# Patient Record
Sex: Male | Born: 1965 | Race: White | Hispanic: No | Smoking: Never smoker
Health system: Southern US, Community
[De-identification: ages and names within clinical notes are randomized; demographics above are authoritative.]

## PROBLEM LIST (undated history)

## (undated) DIAGNOSIS — R161 Splenomegaly, not elsewhere classified: Secondary | ICD-10-CM

## (undated) DIAGNOSIS — C61 Malignant neoplasm of prostate: Secondary | ICD-10-CM

## (undated) DIAGNOSIS — N2889 Other specified disorders of kidney and ureter: Secondary | ICD-10-CM

## (undated) DIAGNOSIS — I10 Essential (primary) hypertension: Secondary | ICD-10-CM

## (undated) DIAGNOSIS — R55 Syncope and collapse: Secondary | ICD-10-CM

## (undated) DIAGNOSIS — E781 Pure hyperglyceridemia: Secondary | ICD-10-CM

## (undated) DIAGNOSIS — E8881 Metabolic syndrome: Secondary | ICD-10-CM

## (undated) DIAGNOSIS — Z836 Family history of other diseases of the respiratory system: Secondary | ICD-10-CM

## (undated) DIAGNOSIS — C9211 Chronic myeloid leukemia, BCR/ABL-positive, in remission: Secondary | ICD-10-CM

## (undated) DIAGNOSIS — Z8701 Personal history of pneumonia (recurrent): Secondary | ICD-10-CM

## (undated) DIAGNOSIS — K7689 Other specified diseases of liver: Secondary | ICD-10-CM

## (undated) DIAGNOSIS — N183 Chronic kidney disease, stage 3 (moderate): Secondary | ICD-10-CM

## (undated) DIAGNOSIS — K603 Anal fistula: Secondary | ICD-10-CM

## (undated) DIAGNOSIS — N182 Chronic kidney disease, stage 2 (mild): Secondary | ICD-10-CM

## (undated) DIAGNOSIS — L290 Pruritus ani: Secondary | ICD-10-CM

## (undated) HISTORY — DX: Other specified disorders of kidney and ureter: N28.89

## (undated) HISTORY — DX: Splenomegaly, not elsewhere classified: R16.1

## (undated) HISTORY — DX: Essential (primary) hypertension: I10

## (undated) HISTORY — DX: Chronic kidney disease, stage 2 (mild): N18.2

## (undated) HISTORY — DX: Malignant neoplasm of prostate: C61

## (undated) HISTORY — PX: TONSILECTOMY, ADENOIDECTOMY, BILATERAL MYRINGOTOMY AND TUBES: SHX2538

## (undated) HISTORY — DX: Other specified diseases of liver: K76.89

## (undated) HISTORY — DX: Personal history of pneumonia (recurrent): Z87.01

## (undated) HISTORY — DX: Chronic myeloid leukemia, BCR/ABL-positive, in remission: C92.11

## (undated) HISTORY — DX: Metabolic syndrome: E88.810

## (undated) HISTORY — PX: PROSTATE BIOPSY: SHX241

## (undated) HISTORY — DX: Syncope and collapse: R55

## (undated) HISTORY — PX: EYE SURGERY: SHX253

## (undated) HISTORY — DX: Pure hyperglyceridemia: E78.1

## (undated) HISTORY — DX: Anal fistula: K60.3

## (undated) HISTORY — DX: Family history of other diseases of the respiratory system: Z83.6

## (undated) HISTORY — DX: Pruritus ani: L29.0

## (undated) HISTORY — DX: Metabolic syndrome: E88.81

## (undated) HISTORY — DX: Chronic kidney disease, stage 3 (moderate): N18.3

---

## 1998-12-20 HISTORY — PX: REFRACTIVE SURGERY: SHX103

## 2008-02-12 ENCOUNTER — Ambulatory Visit: Payer: Self-pay | Admitting: Internal Medicine

## 2008-02-12 ENCOUNTER — Ambulatory Visit: Payer: Self-pay | Admitting: Cardiology

## 2008-02-12 DIAGNOSIS — D649 Anemia, unspecified: Secondary | ICD-10-CM

## 2008-02-12 DIAGNOSIS — R161 Splenomegaly, not elsewhere classified: Secondary | ICD-10-CM

## 2008-02-12 LAB — CONVERTED CEMR LAB
ALT: 35 units/L (ref 0–53)
AST: 39 units/L — ABNORMAL HIGH (ref 0–37)
Albumin: 4.3 g/dL (ref 3.5–5.2)
Alkaline Phosphatase: 102 units/L (ref 39–117)
BUN: 15 mg/dL (ref 6–23)
Band Neutrophils: 26 % — ABNORMAL HIGH (ref 0–10)
Basophils Relative: 5 % — ABNORMAL HIGH (ref 0–1)
Bilirubin Urine: NEGATIVE
Bilirubin, Direct: 0.2 mg/dL (ref 0.0–0.3)
Blood in Urine, dipstick: NEGATIVE
CO2: 27 meq/L (ref 19–32)
Calcium: 9.5 mg/dL (ref 8.4–10.5)
Chloride: 104 meq/L (ref 96–112)
Cholesterol: 111 mg/dL (ref 0–200)
Creatinine, Ser: 1.1 mg/dL (ref 0.4–1.5)
Eosinophils Relative: 4 % (ref 0–5)
GFR calc Af Amer: 94 mL/min
GFR calc non Af Amer: 78 mL/min
Glucose, Bld: 89 mg/dL (ref 70–99)
Glucose, Urine, Semiquant: NEGATIVE
HCT: 36 % — ABNORMAL LOW (ref 39.0–52.0)
HDL: 20.2 mg/dL — ABNORMAL LOW (ref >39.0)
Ketones, urine, test strip: NEGATIVE
LDL Cholesterol: 55 mg/dL (ref 0–99)
Lymphocytes Relative: 1 % — ABNORMAL LOW (ref 12–46)
MCV: 88.9 fL (ref 78.0–100.0)
Metamyelocytes Relative: 9 %
Monocytes Relative: 6 % (ref 3–12)
Myelocytes: 14 %
Neutrophils Relative %: 33 % — ABNORMAL LOW (ref 43–77)
Nitrite: NEGATIVE
Other: 2 % — ABNORMAL HIGH
Platelets: 198 10*3/uL (ref 150–400)
Potassium: 4 meq/L (ref 3.5–5.1)
Protein, U semiquant: NEGATIVE
RBC: 4.05 M/uL — ABNORMAL LOW (ref 4.22–5.81)
Sodium: 139 meq/L (ref 135–145)
Specific Gravity, Urine: 1.015
TSH: 1.83 microintl units/mL (ref 0.35–5.50)
Total Bilirubin: 1.1 mg/dL (ref 0.3–1.2)
Total CHOL/HDL Ratio: 5.5
Total Protein: 6.8 g/dL (ref 6.0–8.3)
Triglycerides: 180 mg/dL — ABNORMAL HIGH (ref 0–149)
Urobilinogen, UA: NEGATIVE
VLDL: 36 mg/dL (ref 0–40)
WBC Urine, dipstick: NEGATIVE
WBC: 140.1 10*3/uL (ref 4.0–10.5)
pH: 5

## 2008-02-13 ENCOUNTER — Telehealth: Payer: Self-pay | Admitting: Internal Medicine

## 2008-02-13 ENCOUNTER — Ambulatory Visit: Payer: Self-pay | Admitting: Oncology

## 2008-02-14 ENCOUNTER — Encounter: Payer: Self-pay | Admitting: Internal Medicine

## 2008-02-14 LAB — CBC WITH DIFFERENTIAL/PLATELET
MCHC: 32.9 g/dL (ref 32.0–35.9)
MCV: 86.2 fL (ref 81.6–98.0)
Platelets: 228 10*3/uL (ref 145–400)
RBC: 4.4 10*6/uL (ref 4.20–5.71)

## 2008-02-14 LAB — MANUAL DIFFERENTIAL
ANC (CHCC manual diff): 133.2 10*3/uL — ABNORMAL HIGH (ref 1.5–6.5)
Basophil: 11 % — ABNORMAL HIGH (ref 0–2)
EOS: 4 % (ref 0–7)
LYMPH: 1 % — ABNORMAL LOW (ref 14–49)
MONO: 1 % (ref 0–14)
Metamyelocytes: 11 % — ABNORMAL HIGH (ref 0–0)

## 2008-02-16 LAB — CBC WITH DIFFERENTIAL/PLATELET
HCT: 33.5 % — ABNORMAL LOW (ref 38.7–49.9)
HGB: 10.7 g/dL — ABNORMAL LOW (ref 13.0–17.1)
MCV: 87.3 fL (ref 81.6–98.0)
Platelets: 201 10*3/uL (ref 145–400)
WBC: 127 10*3/uL (ref 4.0–10.0)

## 2008-02-16 LAB — MANUAL DIFFERENTIAL
Band Neutrophils: 14 % — ABNORMAL HIGH (ref 0–10)
EOS: 3 % (ref 0–7)
MONO: 1 % (ref 0–14)
Other Cell: 0 % (ref 0–0)
SEG: 47 % (ref 38–77)
nRBC: 1 % — ABNORMAL HIGH (ref 0–0)

## 2008-02-18 HISTORY — PX: BONE MARROW BIOPSY: SHX199

## 2008-02-19 ENCOUNTER — Ambulatory Visit: Payer: Self-pay | Admitting: Oncology

## 2008-02-19 ENCOUNTER — Encounter (HOSPITAL_COMMUNITY): Payer: Self-pay | Admitting: Oncology

## 2008-02-19 ENCOUNTER — Ambulatory Visit (HOSPITAL_COMMUNITY): Admission: RE | Admit: 2008-02-19 | Discharge: 2008-02-19 | Payer: Self-pay | Admitting: Oncology

## 2008-02-20 LAB — COMPREHENSIVE METABOLIC PANEL
AST: 36 U/L (ref 0–37)
Albumin: 5 g/dL (ref 3.5–5.2)
Alkaline Phosphatase: 114 U/L (ref 39–117)
Potassium: 4 mEq/L (ref 3.5–5.3)
Sodium: 141 mEq/L (ref 135–145)
Total Protein: 7.8 g/dL (ref 6.0–8.3)

## 2008-02-22 ENCOUNTER — Encounter: Payer: Self-pay | Admitting: Internal Medicine

## 2008-02-22 LAB — CBC WITH DIFFERENTIAL/PLATELET
MCHC: 32.6 g/dL (ref 32.0–35.9)
MCV: 85.9 fL (ref 81.6–98.0)
Platelets: 217 10*3/uL (ref 145–400)
RBC: 4.02 10*6/uL — ABNORMAL LOW (ref 4.20–5.71)
RDW: 17.3 % — ABNORMAL HIGH (ref 11.2–14.6)
WBC: 135.8 10*3/uL (ref 4.0–10.0)

## 2008-02-22 LAB — COMPREHENSIVE METABOLIC PANEL
ALT: 37 U/L (ref 0–53)
AST: 31 U/L (ref 0–37)
Albumin: 4.5 g/dL (ref 3.5–5.2)
CO2: 26 mEq/L (ref 19–32)
Calcium: 9.5 mg/dL (ref 8.4–10.5)
Chloride: 105 mEq/L (ref 96–112)
Potassium: 3.9 mEq/L (ref 3.5–5.3)
Total Protein: 7.1 g/dL (ref 6.0–8.3)

## 2008-02-22 LAB — MANUAL DIFFERENTIAL
ALC: 1.4 10*3/uL (ref 0.9–3.3)
EOS: 2 % (ref 0–7)
MONO: 0 % (ref 0–14)
Metamyelocytes: 19 % — ABNORMAL HIGH (ref 0–0)
Myelocytes: 9 % — ABNORMAL HIGH (ref 0–0)
PLT EST: ADEQUATE
PROMYELO: 0 % (ref 0–0)

## 2008-02-22 LAB — URIC ACID: Uric Acid, Serum: 7.9 mg/dL — ABNORMAL HIGH (ref 4.0–7.8)

## 2008-02-22 LAB — LACTATE DEHYDROGENASE: LDH: 602 U/L — ABNORMAL HIGH (ref 94–250)

## 2008-03-01 LAB — CBC WITH DIFFERENTIAL/PLATELET
Basophils Absolute: 0.2 10*3/uL — ABNORMAL HIGH (ref 0.0–0.1)
Eosinophils Absolute: 4.7 10*3/uL — ABNORMAL HIGH (ref 0.0–0.5)
HGB: 11.4 g/dL — ABNORMAL LOW (ref 13.0–17.1)
MCV: 85.8 fL (ref 81.6–98.0)
MONO%: 0.9 % (ref 0.0–13.0)
NEUT#: 133 10*3/uL — ABNORMAL HIGH (ref 1.5–6.5)
RDW: 16.5 % — ABNORMAL HIGH (ref 11.2–14.6)
lymph#: 9.9 10*3/uL — ABNORMAL HIGH (ref 0.9–3.3)

## 2008-03-03 LAB — IRON AND TIBC
%SAT: 27 % (ref 20–55)
Iron: 110 ug/dL (ref 42–165)
TIBC: 408 ug/dL (ref 215–435)
UIBC: 298 ug/dL

## 2008-03-03 LAB — FERRITIN: Ferritin: 79 ng/mL (ref 22–322)

## 2008-03-03 LAB — TRANSFERRIN RECEPTOR, SOLUABLE: Transferrin Receptor, Soluble: 39.6 nmol/L

## 2008-03-07 ENCOUNTER — Encounter: Payer: Self-pay | Admitting: Internal Medicine

## 2008-03-26 ENCOUNTER — Encounter: Payer: Self-pay | Admitting: Internal Medicine

## 2008-03-26 LAB — CBC WITH DIFFERENTIAL/PLATELET
BASO%: 1.5 % (ref 0.0–2.0)
Basophils Absolute: 0.1 10*3/uL (ref 0.0–0.1)
EOS%: 0.5 % (ref 0.0–7.0)
HCT: 34.7 % — ABNORMAL LOW (ref 38.7–49.9)
HGB: 12 g/dL — ABNORMAL LOW (ref 13.0–17.1)
LYMPH%: 18.8 % (ref 14.0–48.0)
MCH: 29.9 pg (ref 28.0–33.4)
MCHC: 34.5 g/dL (ref 32.0–35.9)
MCV: 86.4 fL (ref 81.6–98.0)
NEUT%: 76.3 % — ABNORMAL HIGH (ref 40.0–75.0)
Platelets: 123 10*3/uL — ABNORMAL LOW (ref 145–400)

## 2008-03-26 LAB — COMPREHENSIVE METABOLIC PANEL
ALT: 29 U/L (ref 0–53)
AST: 23 U/L (ref 0–37)
BUN: 20 mg/dL (ref 6–23)
Calcium: 8.9 mg/dL (ref 8.4–10.5)
Creatinine, Ser: 1.19 mg/dL (ref 0.40–1.50)
Total Bilirubin: 0.7 mg/dL (ref 0.3–1.2)

## 2008-03-26 LAB — URIC ACID: Uric Acid, Serum: 3.2 mg/dL — ABNORMAL LOW (ref 4.0–7.8)

## 2008-04-24 ENCOUNTER — Ambulatory Visit: Payer: Self-pay | Admitting: Oncology

## 2008-04-26 LAB — CBC WITH DIFFERENTIAL/PLATELET
Eosinophils Absolute: 0 10*3/uL (ref 0.0–0.5)
LYMPH%: 22.9 % (ref 14.0–48.0)
MONO#: 0.2 10*3/uL (ref 0.1–0.9)
NEUT#: 2.1 10*3/uL (ref 1.5–6.5)
Platelets: 90 10*3/uL — ABNORMAL LOW (ref 145–400)
RBC: 4.24 10*6/uL (ref 4.20–5.71)
WBC: 2.9 10*3/uL — ABNORMAL LOW (ref 4.0–10.0)
lymph#: 0.7 10*3/uL — ABNORMAL LOW (ref 0.9–3.3)

## 2008-05-06 LAB — CBC WITH DIFFERENTIAL/PLATELET
Basophils Absolute: 0 10*3/uL (ref 0.0–0.1)
Eosinophils Absolute: 0 10*3/uL (ref 0.0–0.5)
HCT: 36.3 % — ABNORMAL LOW (ref 38.7–49.9)
HGB: 12.4 g/dL — ABNORMAL LOW (ref 13.0–17.1)
LYMPH%: 27.7 % (ref 14.0–48.0)
MCHC: 34.1 g/dL (ref 32.0–35.9)
MONO#: 0.2 10*3/uL (ref 0.1–0.9)
NEUT#: 1.9 10*3/uL (ref 1.5–6.5)
NEUT%: 64 % (ref 40.0–75.0)
Platelets: 88 10*3/uL — ABNORMAL LOW (ref 145–400)
WBC: 2.9 10*3/uL — ABNORMAL LOW (ref 4.0–10.0)

## 2008-05-30 LAB — CBC WITH DIFFERENTIAL/PLATELET
Basophils Absolute: 0 10*3/uL (ref 0.0–0.1)
EOS%: 0.3 % (ref 0.0–7.0)
HCT: 38.2 % — ABNORMAL LOW (ref 38.7–49.9)
HGB: 13.2 g/dL (ref 13.0–17.1)
LYMPH%: 32.7 % (ref 14.0–48.0)
MCH: 29.3 pg (ref 28.0–33.4)
MCV: 84.9 fL (ref 81.6–98.0)
MONO%: 6.4 % (ref 0.0–13.0)
NEUT%: 59.9 % (ref 40.0–75.0)
Platelets: 104 10*3/uL — ABNORMAL LOW (ref 145–400)

## 2008-06-01 LAB — IRON AND TIBC: Iron: 108 ug/dL (ref 42–165)

## 2008-06-01 LAB — COMPREHENSIVE METABOLIC PANEL
AST: 24 U/L (ref 0–37)
Albumin: 4.6 g/dL (ref 3.5–5.2)
BUN: 15 mg/dL (ref 6–23)
Calcium: 9.6 mg/dL (ref 8.4–10.5)
Chloride: 109 mEq/L (ref 96–112)
Glucose, Bld: 101 mg/dL — ABNORMAL HIGH (ref 70–99)
Potassium: 4.1 mEq/L (ref 3.5–5.3)

## 2008-06-01 LAB — FERRITIN: Ferritin: 34 ng/mL (ref 22–322)

## 2008-06-01 LAB — URIC ACID: Uric Acid, Serum: 2.7 mg/dL — ABNORMAL LOW (ref 4.0–7.8)

## 2008-06-06 LAB — BCR/ABL

## 2008-06-25 ENCOUNTER — Encounter: Payer: Self-pay | Admitting: Internal Medicine

## 2008-06-25 ENCOUNTER — Ambulatory Visit: Payer: Self-pay | Admitting: Oncology

## 2008-06-28 LAB — CBC WITH DIFFERENTIAL/PLATELET
Basophils Absolute: 0 10*3/uL (ref 0.0–0.1)
HCT: 38.5 % — ABNORMAL LOW (ref 38.7–49.9)
HGB: 13.5 g/dL (ref 13.0–17.1)
LYMPH%: 32.6 % (ref 14.0–48.0)
MONO#: 0.2 10*3/uL (ref 0.1–0.9)
NEUT%: 59.2 % (ref 40.0–75.0)
Platelets: 110 10*3/uL — ABNORMAL LOW (ref 145–400)
WBC: 3.2 10*3/uL — ABNORMAL LOW (ref 4.0–10.0)
lymph#: 1 10*3/uL (ref 0.9–3.3)

## 2008-08-02 LAB — CBC WITH DIFFERENTIAL/PLATELET
BASO%: 0.7 % (ref 0.0–2.0)
Eosinophils Absolute: 0 10*3/uL (ref 0.0–0.5)
HCT: 36.8 % — ABNORMAL LOW (ref 38.7–49.9)
MCHC: 35.4 g/dL (ref 32.0–35.9)
MONO#: 0.2 10*3/uL (ref 0.1–0.9)
NEUT#: 1.7 10*3/uL (ref 1.5–6.5)
NEUT%: 57.9 % (ref 40.0–75.0)
Platelets: 116 10*3/uL — ABNORMAL LOW (ref 145–400)
RBC: 4.05 10*6/uL — ABNORMAL LOW (ref 4.20–5.71)
WBC: 2.9 10*3/uL — ABNORMAL LOW (ref 4.0–10.0)
lymph#: 1 10*3/uL (ref 0.9–3.3)

## 2008-08-27 ENCOUNTER — Ambulatory Visit: Payer: Self-pay | Admitting: Oncology

## 2008-08-30 LAB — CBC WITH DIFFERENTIAL/PLATELET
BASO%: 0.8 % (ref 0.0–2.0)
Basophils Absolute: 0 10*3/uL (ref 0.0–0.1)
EOS%: 0.4 % (ref 0.0–7.0)
HCT: 38 % — ABNORMAL LOW (ref 38.7–49.9)
HGB: 13.4 g/dL (ref 13.0–17.1)
MCH: 33.3 pg (ref 28.0–33.4)
MCHC: 35.4 g/dL (ref 32.0–35.9)
MCV: 94.1 fL (ref 81.6–98.0)
MONO%: 8.3 % (ref 0.0–13.0)
NEUT%: 57.1 % (ref 40.0–75.0)

## 2008-08-30 LAB — COMPREHENSIVE METABOLIC PANEL
AST: 27 U/L (ref 0–37)
Alkaline Phosphatase: 70 U/L (ref 39–117)
BUN: 18 mg/dL (ref 6–23)
Calcium: 9.2 mg/dL (ref 8.4–10.5)
Creatinine, Ser: 1.06 mg/dL (ref 0.40–1.50)
Total Bilirubin: 1.1 mg/dL (ref 0.3–1.2)

## 2008-09-04 LAB — BCR/ABL

## 2008-09-25 LAB — CBC WITH DIFFERENTIAL/PLATELET
Basophils Absolute: 0 10*3/uL (ref 0.0–0.1)
EOS%: 0.4 % (ref 0.0–7.0)
HCT: 37.1 % — ABNORMAL LOW (ref 38.7–49.9)
HGB: 13.2 g/dL (ref 13.0–17.1)
LYMPH%: 31.9 % (ref 14.0–48.0)
MCH: 33.6 pg — ABNORMAL HIGH (ref 28.0–33.4)
MCHC: 35.6 g/dL (ref 32.0–35.9)
MONO#: 0.3 10*3/uL (ref 0.1–0.9)
NEUT%: 60.7 % (ref 40.0–75.0)
Platelets: 129 10*3/uL — ABNORMAL LOW (ref 145–400)
lymph#: 1.4 10*3/uL (ref 0.9–3.3)

## 2008-10-21 ENCOUNTER — Ambulatory Visit: Payer: Self-pay | Admitting: Oncology

## 2008-10-23 LAB — CBC WITH DIFFERENTIAL/PLATELET
BASO%: 0.6 % (ref 0.0–2.0)
Eosinophils Absolute: 0 10*3/uL (ref 0.0–0.5)
HCT: 38.3 % — ABNORMAL LOW (ref 38.7–49.9)
HGB: 13.4 g/dL (ref 13.0–17.1)
LYMPH%: 34.8 % (ref 14.0–48.0)
MCHC: 34.9 g/dL (ref 32.0–35.9)
MONO#: 0.3 10*3/uL (ref 0.1–0.9)
NEUT#: 2.5 10*3/uL (ref 1.5–6.5)
NEUT%: 57 % (ref 40.0–75.0)
Platelets: 126 10*3/uL — ABNORMAL LOW (ref 145–400)
WBC: 4.4 10*3/uL (ref 4.0–10.0)
lymph#: 1.5 10*3/uL (ref 0.9–3.3)

## 2008-12-06 ENCOUNTER — Ambulatory Visit: Payer: Self-pay | Admitting: Oncology

## 2008-12-06 ENCOUNTER — Encounter: Payer: Self-pay | Admitting: Internal Medicine

## 2008-12-06 LAB — CBC WITH DIFFERENTIAL/PLATELET
Basophils Absolute: 0 10*3/uL (ref 0.0–0.1)
EOS%: 0.4 % (ref 0.0–7.0)
HCT: 39.7 % (ref 38.7–49.9)
HGB: 13.9 g/dL (ref 13.0–17.1)
MCH: 33.1 pg (ref 28.0–33.4)
MCV: 94.6 fL (ref 81.6–98.0)
MONO%: 7.7 % (ref 0.0–13.0)
NEUT%: 57.4 % (ref 40.0–75.0)

## 2008-12-06 LAB — COMPREHENSIVE METABOLIC PANEL
AST: 26 U/L (ref 0–37)
Alkaline Phosphatase: 69 U/L (ref 39–117)
BUN: 16 mg/dL (ref 6–23)
Calcium: 9.5 mg/dL (ref 8.4–10.5)
Creatinine, Ser: 1.16 mg/dL (ref 0.40–1.50)

## 2008-12-17 LAB — BCR/ABL

## 2009-01-08 ENCOUNTER — Ambulatory Visit: Payer: Self-pay | Admitting: Oncology

## 2009-02-10 LAB — CBC WITH DIFFERENTIAL/PLATELET
Basophils Absolute: 0 10*3/uL (ref 0.0–0.1)
HCT: 38.6 % (ref 38.4–49.9)
HGB: 13.3 g/dL (ref 13.0–17.1)
MCH: 33.1 pg (ref 27.2–33.4)
MONO#: 0.2 10*3/uL (ref 0.1–0.9)
NEUT%: 54.4 % (ref 39.0–75.0)
WBC: 3.7 10*3/uL — ABNORMAL LOW (ref 4.0–10.3)
lymph#: 1.4 10*3/uL (ref 0.9–3.3)

## 2009-04-04 ENCOUNTER — Ambulatory Visit: Payer: Self-pay | Admitting: Oncology

## 2009-04-08 ENCOUNTER — Encounter: Payer: Self-pay | Admitting: Internal Medicine

## 2009-04-08 LAB — COMPREHENSIVE METABOLIC PANEL
Alkaline Phosphatase: 66 U/L (ref 39–117)
BUN: 15 mg/dL (ref 6–23)
CO2: 25 mEq/L (ref 19–32)
Creatinine, Ser: 1.12 mg/dL (ref 0.40–1.50)
Glucose, Bld: 84 mg/dL (ref 70–99)
Total Bilirubin: 0.7 mg/dL (ref 0.3–1.2)
Total Protein: 6.5 g/dL (ref 6.0–8.3)

## 2009-04-08 LAB — CBC WITH DIFFERENTIAL/PLATELET
Basophils Absolute: 0 10*3/uL (ref 0.0–0.1)
Eosinophils Absolute: 0 10*3/uL (ref 0.0–0.5)
HCT: 35.8 % — ABNORMAL LOW (ref 38.4–49.9)
LYMPH%: 33.4 % (ref 14.0–49.0)
MCV: 93.8 fL (ref 79.3–98.0)
MONO#: 0.1 10*3/uL (ref 0.1–0.9)
MONO%: 6.7 % (ref 0.0–14.0)
NEUT#: 1.3 10*3/uL — ABNORMAL LOW (ref 1.5–6.5)
NEUT%: 58.7 % (ref 39.0–75.0)
Platelets: 131 10*3/uL — ABNORMAL LOW (ref 140–400)
WBC: 2.2 10*3/uL — ABNORMAL LOW (ref 4.0–10.3)

## 2009-04-17 LAB — BCR/ABL

## 2009-05-08 LAB — CBC WITH DIFFERENTIAL/PLATELET
Basophils Absolute: 0 10*3/uL (ref 0.0–0.1)
Eosinophils Absolute: 0 10*3/uL (ref 0.0–0.5)
HGB: 13.3 g/dL (ref 13.0–17.1)
MCV: 95.8 fL (ref 79.3–98.0)
MONO#: 0.3 10*3/uL (ref 0.1–0.9)
MONO%: 6.9 % (ref 0.0–14.0)
NEUT#: 3 10*3/uL (ref 1.5–6.5)
Platelets: 142 10*3/uL (ref 140–400)
RBC: 4.08 10*6/uL — ABNORMAL LOW (ref 4.20–5.82)
RDW: 13.6 % (ref 11.0–14.6)
WBC: 4.9 10*3/uL (ref 4.0–10.3)

## 2009-06-05 ENCOUNTER — Ambulatory Visit: Payer: Self-pay | Admitting: Oncology

## 2009-06-09 LAB — CBC WITH DIFFERENTIAL/PLATELET
Basophils Absolute: 0 10*3/uL (ref 0.0–0.1)
EOS%: 0.5 % (ref 0.0–7.0)
Eosinophils Absolute: 0 10*3/uL (ref 0.0–0.5)
HGB: 13 g/dL (ref 13.0–17.1)
MCV: 94.4 fL (ref 79.3–98.0)
MONO%: 6.6 % (ref 0.0–14.0)
NEUT#: 2.6 10*3/uL (ref 1.5–6.5)
RBC: 3.81 10*6/uL — ABNORMAL LOW (ref 4.20–5.82)
RDW: 13 % (ref 11.0–14.6)
WBC: 4.3 10*3/uL (ref 4.0–10.3)
lymph#: 1.3 10*3/uL (ref 0.9–3.3)

## 2009-07-07 ENCOUNTER — Ambulatory Visit: Payer: Self-pay | Admitting: Oncology

## 2009-07-09 LAB — CBC WITH DIFFERENTIAL/PLATELET
BASO%: 0.3 % (ref 0.0–2.0)
EOS%: 0.7 % (ref 0.0–7.0)
HGB: 13 g/dL (ref 13.0–17.1)
MCH: 33.4 pg (ref 27.2–33.4)
MCHC: 35.3 g/dL (ref 32.0–36.0)
MCV: 94.7 fL (ref 79.3–98.0)
MONO%: 6.9 % (ref 0.0–14.0)
RBC: 3.89 10*6/uL — ABNORMAL LOW (ref 4.20–5.82)
RDW: 13.8 % (ref 11.0–14.6)
lymph#: 1.2 10*3/uL (ref 0.9–3.3)

## 2009-08-14 ENCOUNTER — Ambulatory Visit: Payer: Self-pay | Admitting: Oncology

## 2009-08-18 ENCOUNTER — Encounter: Payer: Self-pay | Admitting: Internal Medicine

## 2009-08-18 LAB — URIC ACID: Uric Acid, Serum: 4.2 mg/dL (ref 4.0–7.8)

## 2009-08-18 LAB — LACTATE DEHYDROGENASE: LDH: 172 U/L (ref 94–250)

## 2009-08-18 LAB — CBC WITH DIFFERENTIAL/PLATELET
Eosinophils Absolute: 0 10*3/uL (ref 0.0–0.5)
HCT: 36.9 % — ABNORMAL LOW (ref 38.4–49.9)
LYMPH%: 33.4 % (ref 14.0–49.0)
MONO#: 0.3 10*3/uL (ref 0.1–0.9)
NEUT#: 1.9 10*3/uL (ref 1.5–6.5)
NEUT%: 58.2 % (ref 39.0–75.0)
Platelets: 117 10*3/uL — ABNORMAL LOW (ref 140–400)
RBC: 3.98 10*6/uL — ABNORMAL LOW (ref 4.20–5.82)
WBC: 3.3 10*3/uL — ABNORMAL LOW (ref 4.0–10.3)
lymph#: 1.1 10*3/uL (ref 0.9–3.3)

## 2009-08-18 LAB — COMPREHENSIVE METABOLIC PANEL
ALT: 19 U/L (ref 0–53)
CO2: 24 mEq/L (ref 19–32)
Calcium: 8.9 mg/dL (ref 8.4–10.5)
Chloride: 107 mEq/L (ref 96–112)
Glucose, Bld: 91 mg/dL (ref 70–99)
Sodium: 139 mEq/L (ref 135–145)
Total Bilirubin: 0.7 mg/dL (ref 0.3–1.2)
Total Protein: 6.4 g/dL (ref 6.0–8.3)

## 2009-10-09 ENCOUNTER — Ambulatory Visit: Payer: Self-pay | Admitting: Oncology

## 2009-10-13 LAB — CBC WITH DIFFERENTIAL/PLATELET
BASO%: 0.6 % (ref 0.0–2.0)
Eosinophils Absolute: 0 10*3/uL (ref 0.0–0.5)
MONO#: 0.3 10*3/uL (ref 0.1–0.9)
NEUT#: 2.7 10*3/uL (ref 1.5–6.5)
RBC: 3.88 10*6/uL — ABNORMAL LOW (ref 4.20–5.82)
RDW: 13.3 % (ref 11.0–14.6)
WBC: 4.6 10*3/uL (ref 4.0–10.3)

## 2009-11-28 ENCOUNTER — Ambulatory Visit: Payer: Self-pay | Admitting: Internal Medicine

## 2009-12-22 ENCOUNTER — Ambulatory Visit: Payer: Self-pay | Admitting: Oncology

## 2009-12-25 ENCOUNTER — Encounter: Payer: Self-pay | Admitting: Internal Medicine

## 2009-12-25 LAB — CBC WITH DIFFERENTIAL/PLATELET
BASO%: 0.5 % (ref 0.0–2.0)
Basophils Absolute: 0 10*3/uL (ref 0.0–0.1)
EOS%: 0.2 % (ref 0.0–7.0)
Eosinophils Absolute: 0 10*3/uL (ref 0.0–0.5)
HCT: 38.5 % (ref 38.4–49.9)
HGB: 13.3 g/dL (ref 13.0–17.1)
LYMPH%: 27.2 % (ref 14.0–49.0)
MCH: 33.5 pg — ABNORMAL HIGH (ref 27.2–33.4)
MCHC: 34.6 g/dL (ref 32.0–36.0)
MCV: 96.8 fL (ref 79.3–98.0)
MONO#: 0.3 10*3/uL (ref 0.1–0.9)
MONO%: 6.1 % (ref 0.0–14.0)
NEUT#: 2.9 10*3/uL (ref 1.5–6.5)
NEUT%: 66 % (ref 39.0–75.0)
Platelets: 138 10*3/uL — ABNORMAL LOW (ref 140–400)
RBC: 3.98 10*6/uL — ABNORMAL LOW (ref 4.20–5.82)
RDW: 13.4 % (ref 11.0–14.6)
WBC: 4.4 10*3/uL (ref 4.0–10.3)
lymph#: 1.2 10*3/uL (ref 0.9–3.3)

## 2009-12-25 LAB — COMPREHENSIVE METABOLIC PANEL
ALT: 22 U/L (ref 0–53)
AST: 27 U/L (ref 0–37)
Alkaline Phosphatase: 59 U/L (ref 39–117)
BUN: 16 mg/dL (ref 6–23)
Creatinine, Ser: 1.25 mg/dL (ref 0.40–1.50)

## 2010-01-01 LAB — BCR/ABL

## 2010-04-22 ENCOUNTER — Ambulatory Visit: Payer: Self-pay | Admitting: Oncology

## 2010-04-23 ENCOUNTER — Encounter: Payer: Self-pay | Admitting: Internal Medicine

## 2010-04-23 LAB — CBC WITH DIFFERENTIAL/PLATELET
EOS%: 1 % (ref 0.0–7.0)
Eosinophils Absolute: 0 10*3/uL (ref 0.0–0.5)
MCV: 96.2 fL (ref 79.3–98.0)
MONO%: 5.4 % (ref 0.0–14.0)
NEUT#: 3 10*3/uL (ref 1.5–6.5)
RBC: 3.85 10*6/uL — ABNORMAL LOW (ref 4.20–5.82)
RDW: 12.7 % (ref 11.0–14.6)

## 2010-04-23 LAB — COMPREHENSIVE METABOLIC PANEL
AST: 25 U/L (ref 0–37)
Albumin: 4.6 g/dL (ref 3.5–5.2)
Alkaline Phosphatase: 72 U/L (ref 39–117)
Potassium: 4 mEq/L (ref 3.5–5.3)
Sodium: 138 mEq/L (ref 135–145)
Total Protein: 7 g/dL (ref 6.0–8.3)

## 2010-08-28 ENCOUNTER — Ambulatory Visit: Payer: Self-pay | Admitting: Oncology

## 2010-09-01 ENCOUNTER — Encounter: Payer: Self-pay | Admitting: Internal Medicine

## 2010-09-01 LAB — COMPREHENSIVE METABOLIC PANEL
ALT: 28 U/L (ref 0–53)
BUN: 13 mg/dL (ref 6–23)
CO2: 28 mEq/L (ref 19–32)
Calcium: 8.9 mg/dL (ref 8.4–10.5)
Chloride: 108 mEq/L (ref 96–112)
Creatinine, Ser: 1.14 mg/dL (ref 0.40–1.50)
Glucose, Bld: 116 mg/dL — ABNORMAL HIGH (ref 70–99)

## 2010-09-01 LAB — CBC WITH DIFFERENTIAL/PLATELET
BASO%: 0.4 % (ref 0.0–2.0)
Basophils Absolute: 0 10*3/uL (ref 0.0–0.1)
Eosinophils Absolute: 0 10*3/uL (ref 0.0–0.5)
HCT: 36.8 % — ABNORMAL LOW (ref 38.4–49.9)
HGB: 13.2 g/dL (ref 13.0–17.1)
MONO#: 0.2 10*3/uL (ref 0.1–0.9)
NEUT#: 2.2 10*3/uL (ref 1.5–6.5)
NEUT%: 58.8 % (ref 39.0–75.0)
Platelets: 145 10*3/uL (ref 140–400)
WBC: 3.7 10*3/uL — ABNORMAL LOW (ref 4.0–10.3)
lymph#: 1.3 10*3/uL (ref 0.9–3.3)

## 2010-09-01 LAB — LACTATE DEHYDROGENASE: LDH: 213 U/L (ref 94–250)

## 2010-09-07 LAB — BCR/ABL (LIO MMD)

## 2010-11-30 ENCOUNTER — Ambulatory Visit: Payer: Self-pay | Admitting: Internal Medicine

## 2010-12-01 ENCOUNTER — Telehealth: Payer: Self-pay | Admitting: Internal Medicine

## 2010-12-29 ENCOUNTER — Ambulatory Visit: Payer: Self-pay | Admitting: Oncology

## 2010-12-31 LAB — CBC WITH DIFFERENTIAL/PLATELET
BASO%: 1.2 % (ref 0.0–2.0)
Basophils Absolute: 0.1 10*3/uL (ref 0.0–0.1)
EOS%: 0.9 % (ref 0.0–7.0)
Eosinophils Absolute: 0 10*3/uL (ref 0.0–0.5)
HCT: 37.7 % — ABNORMAL LOW (ref 38.4–49.9)
HGB: 13.6 g/dL (ref 13.0–17.1)
LYMPH%: 30.2 % (ref 14.0–49.0)
MCH: 34.3 pg — ABNORMAL HIGH (ref 27.2–33.4)
MCHC: 36.2 g/dL — ABNORMAL HIGH (ref 32.0–36.0)
MCV: 94.7 fL (ref 79.3–98.0)
MONO#: 0.3 10*3/uL (ref 0.1–0.9)
MONO%: 6.4 % (ref 0.0–14.0)
NEUT#: 3.1 10*3/uL (ref 1.5–6.5)
NEUT%: 61.3 % (ref 39.0–75.0)
Platelets: 161 10*3/uL (ref 140–400)
RBC: 3.98 10*6/uL — ABNORMAL LOW (ref 4.20–5.82)
RDW: 13.3 % (ref 11.0–14.6)
WBC: 5.1 10*3/uL (ref 4.0–10.3)
lymph#: 1.5 10*3/uL (ref 0.9–3.3)

## 2010-12-31 LAB — COMPREHENSIVE METABOLIC PANEL
ALT: 24 U/L (ref 0–53)
AST: 28 U/L (ref 0–37)
Albumin: 5 g/dL (ref 3.5–5.2)
Alkaline Phosphatase: 76 U/L (ref 39–117)
BUN: 18 mg/dL (ref 6–23)
CO2: 25 mEq/L (ref 19–32)
Calcium: 9.1 mg/dL (ref 8.4–10.5)
Chloride: 104 mEq/L (ref 96–112)
Creatinine, Ser: 1.21 mg/dL (ref 0.40–1.50)
Glucose, Bld: 94 mg/dL (ref 70–99)
Potassium: 3.9 mEq/L (ref 3.5–5.3)
Sodium: 137 mEq/L (ref 135–145)
Total Bilirubin: 0.5 mg/dL (ref 0.3–1.2)
Total Protein: 7.3 g/dL (ref 6.0–8.3)

## 2010-12-31 LAB — LACTATE DEHYDROGENASE: LDH: 190 U/L (ref 94–250)

## 2011-01-06 LAB — BCR/ABL (LIO MMD)

## 2011-01-19 NOTE — Letter (Signed)
[facility]   Imported By: Maryln Gottron 05/07/2010 11:08:16  _____________________________________________________________________  External Attachment:    Type:   Image     Comment:   External Document

## 2011-01-19 NOTE — Letter (Signed)
[facility]   Imported By: Maryln Gottron 01/16/2010 11:13:14  _____________________________________________________________________  External Attachment:    Type:   Image     Comment:   External Document

## 2011-01-19 NOTE — Letter (Signed)
Summary: [facility]  [facility]   Imported By: Maryln Gottron 09/22/2010 13:37:53  _____________________________________________________________________  External Attachment:    Type:   Image     Comment:   External Document

## 2011-01-21 NOTE — Assessment & Plan Note (Signed)
Summary: cpx/cjr   Vital Signs:  Patient profile:   45 year old male Height:      70.75 inches Weight:      200 pounds BMI:     28.19 Temp:     97.6 degrees F oral BP sitting:   118 / 74  (right arm) Cuff size:   regular  Vitals Entered By: Duard Brady LPN (November 30, 2010 3:09 PM) CC: cpx - doing well Is Patient Diabetic? No   CC:  cpx - doing well.  History of Present Illness: 45 year old patient who is seen today for an annual examination.  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  Clinically, he is done quite well.  His only concern is excess of axillary perspiration.  Is also concerned about a very of induration in the perianal area.  This was seen by oncology 3 months ago, and apparently has not changed.  He is scheduled for follow up one month.  This will be reassessed at that time  Allergies (verified): No Known Drug Allergies  Past History:  Past Medical History: Reviewed history from 11/28/2009 and no changes required. CML  Past Surgical History: Reviewed history from 02/12/2008 and no changes required. negative  Family History: Reviewed history from 11/28/2009 and no changes required. father is 33 has a history pulmonary fibrosis, and a history of hypertension; prostate cancer mother died in 2006/05/07 at age 49; [facility] complications from thoracic aneurysm surgery as well as aortic valve repair.  She apparently did well to surgery, but died of heparin-related complications  One brother two sisters are in excellent health   Social History: Reviewed history from 11/28/2009 and no changes required. 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  38-year-old son  avid Research scientist (physical sciences), [employer]  club [place], the past 3 years  Review of Systems  The patient denies anorexia, fever, weight loss, weight gain, vision loss, decreased hearing, hoarseness, chest pain, syncope, dyspnea on  exertion, peripheral edema, prolonged cough, headaches, hemoptysis, abdominal pain, melena, hematochezia, severe indigestion/heartburn, hematuria, incontinence, genital sores, muscle weakness, suspicious skin lesions, transient blindness, difficulty walking, depression, unusual weight change, abnormal bleeding, enlarged lymph nodes, angioedema, breast masses, and testicular masses.    Physical Exam  General:  Well-developed,well-nourished,in no acute distress; alert,appropriate and cooperative throughout examination Head:  Normocephalic and atraumatic without obvious abnormalities. No apparent alopecia or balding. Eyes:  No corneal or conjunctival inflammation noted. EOMI. Perrla. Funduscopic exam benign, without hemorrhages, exudates or papilledema. Vision grossly normal. Ears:  External ear exam shows no significant lesions or deformities.  Otoscopic examination reveals clear canals, tympanic membranes are intact bilaterally without bulging, retraction, inflammation or discharge. Hearing is grossly normal bilaterally. Nose:  External nasal examination shows no deformity or inflammation. Nasal mucosa are pink and moist without lesions or exudates. Mouth:  Oral mucosa and oropharynx without lesions or exudates.  Teeth in good repair. Neck:  No deformities, masses, or tenderness noted. Chest Wall:  No deformities, masses, tenderness or gynecomastia noted. Breasts:  No masses or gynecomastia noted Lungs:  Normal respiratory effort, chest expands symmetrically. Lungs are clear to auscultation, no crackles or wheezes. Heart:  Normal rate and regular rhythm. S1 and S2 normal without gallop, murmur, click, rub or other extra sounds. Abdomen:  Bowel sounds positive,abdomen soft and non-tender without masses, organomegaly or hernias noted. Rectal:  no external abnormalities, no hemorrhoids, normal sphincter tone, and no masses.  a two to 3-cm area  of induration noted in the perirectal area at about the 7  o'clock position.  The overlying skin appears normal in appearance.  There is no tenderness Genitalia:  Testes bilaterally descended without nodularity, tenderness or masses. No scrotal masses or lesions. No penis lesions or urethral discharge. Prostate:  Prostate gland firm and smooth, no enlargement, nodularity, tenderness, mass, asymmetry or induration. Msk:  No deformity or scoliosis noted of thoracic or lumbar spine.   Pulses:  R and L carotid,radial,femoral,dorsalis pedis and posterior tibial pulses are full and equal bilaterally Extremities:  No clubbing, cyanosis, edema, or deformity noted with normal full range of motion of all joints.   Neurologic:  No cranial nerve deficits noted. Station and gait are normal. Plantar reflexes are down-going bilaterally. DTRs are symmetrical throughout. Sensory, motor and coordinative functions appear intact. Skin:  Intact without suspicious lesions or rashes Cervical Nodes:  No lymphadenopathy noted Axillary Nodes:  No palpable lymphadenopathy Inguinal Nodes:  No significant adenopathy Psych:  Cognition and judgment appear intact. Alert and cooperative with normal attention span and concentration. No apparent delusions, illusions, hallucinations   Impression & Recommendations:  Problem # 1:  PREVENTIVE HEALTH CARE (ICD-V70.0) the area of induration in the perirectal area will be reassessed by oncology next month and will be better able to assess increased in size.  Although the etiology remains obscure.  Doubtful this represents significant pathology.  If areabecomes painful or changes, will consider referral to general surgery for biopsy  Complete Medication List: 1)  Gleevec 400 Mg Tabs (Imatinib mesylate) .[name].. 1 once daily 2)  Drysol 20 % Soln (Aluminum chloride) .... Use at bedtime  Patient Instructions: 1)  Please schedule a follow-up appointment in 1 year. 2)  follow-up hematology oncology Prescriptions: DRYSOL 20 % SOLN (ALUMINUM  CHLORIDE) use at bedtime  #6 oz x 12   Entered and Authorized by:   [name]  MD   Signed by:   [name]  MD on 11/30/2010   Method used:   Print then Give to Patient   RxID:   [id number]    Orders Added: 1)  Est. Patient 40-64 years [[id number]]

## 2011-01-21 NOTE — Progress Notes (Signed)
Summary: REFILL drysol - [facility]  Phone Note Refill Request Message from:  Patient  [phone] on December 01, 2010 9:11 AM  Refills Requested: Medication #1:  DRYSOL 20 % SOLN use at bedtime.   Notes: [facility]....90-day supply..... Pt requests that Rx be sent in by LBF.    Initial call taken by: Debbra Riding,  December 01, 2010 9:14 AM    Prescriptions: DRYSOL 20 % SOLN (ALUMINUM CHLORIDE) use at bedtime  #6 oz x 12   Entered by:   Duard Brady LPN   Authorized by:   [name]  MD   Signed by:   Duard Brady LPN on 13/07/6577   Method used:   Faxed to ...       [facility] (mail-order)             , [place]         Ph: [phone]       Fax: [phone]   RxID:   [id number]  faxed to Toni Arthurs

## 2011-04-29 ENCOUNTER — Other Ambulatory Visit (HOSPITAL_COMMUNITY): Payer: Self-pay | Admitting: Oncology

## 2011-04-29 ENCOUNTER — Encounter (HOSPITAL_BASED_OUTPATIENT_CLINIC_OR_DEPARTMENT_OTHER): Payer: BC Managed Care – PPO | Admitting: Oncology

## 2011-04-29 DIAGNOSIS — C9210 Chronic myeloid leukemia, BCR/ABL-positive, not having achieved remission: Secondary | ICD-10-CM

## 2011-04-29 LAB — CBC WITH DIFFERENTIAL/PLATELET
BASO%: 0.7 % (ref 0.0–2.0)
EOS%: 1.5 % (ref 0.0–7.0)
LYMPH%: 27.4 % (ref 14.0–49.0)
MCH: 33.8 pg — ABNORMAL HIGH (ref 27.2–33.4)
MCHC: 35.3 g/dL (ref 32.0–36.0)
MCV: 95.7 fL (ref 79.3–98.0)
MONO%: 6.5 % (ref 0.0–14.0)
Platelets: 139 10*3/uL — ABNORMAL LOW (ref 140–400)
RBC: 3.84 10*6/uL — ABNORMAL LOW (ref 4.20–5.82)
RDW: 12.8 % (ref 11.0–14.6)

## 2011-04-29 LAB — COMPREHENSIVE METABOLIC PANEL
ALT: 32 U/L (ref 0–53)
AST: 33 U/L (ref 0–37)
Alkaline Phosphatase: 65 U/L (ref 39–117)
Glucose, Bld: 71 mg/dL (ref 70–99)
Sodium: 139 mEq/L (ref 135–145)
Total Bilirubin: 0.6 mg/dL (ref 0.3–1.2)
Total Protein: 7 g/dL (ref 6.0–8.3)

## 2011-05-04 LAB — BCR/ABL (LIO MMD)

## 2011-05-04 NOTE — Op Note (Signed)
NAME:  Michael [name], PERKINS NO.:  [id number]   MEDICAL RECORD NO.:  [id number]          PATIENT TYPE:  OUT   LOCATION:  [facility]                         FACILITY:  [facility]   PHYSICIAN:  [name], M.D.DATE OF BIRTH:  Jul 06, 1966   DATE OF PROCEDURE:  02/19/2008  DATE OF DISCHARGE:                               OPERATIVE REPORT   PROCEDURE:  Bone marrow aspirate and biopsy flow studies, cytogenetics,  and fish studies for suspected CML were carried out from the left  posterior superior iliac spine under sterile conditions.   ANESTHESIA:  1% local lidocaine.  Demerol 75 mg and Versed 6 mg given  IV.   Timeout procedure was observed to correctly identify the patient.  The  patient's ASA score can be described as status 2 although he is  completely asymptomatic and has been previously healthy.  His Mallampati  score is III.  The patient tolerated the procedure well.  Good specimen  was obtained.   INDICATIONS FOR PROCEDURE:  Elevated white count and splenomegaly most  suggestive of chronic granulocytic leukemia.      [name], M.D.  Electronically Signed     DSM/MEDQ  D:  02/19/2008  T:  02/19/2008  Job:  [id number]

## 2011-08-30 ENCOUNTER — Other Ambulatory Visit (HOSPITAL_COMMUNITY): Payer: Self-pay | Admitting: Oncology

## 2011-08-30 ENCOUNTER — Encounter (HOSPITAL_BASED_OUTPATIENT_CLINIC_OR_DEPARTMENT_OTHER): Payer: BC Managed Care – PPO | Admitting: Oncology

## 2011-08-30 DIAGNOSIS — C9210 Chronic myeloid leukemia, BCR/ABL-positive, not having achieved remission: Secondary | ICD-10-CM

## 2011-08-30 LAB — CBC WITH DIFFERENTIAL/PLATELET
BASO%: 0.9 % (ref 0.0–2.0)
MCHC: 35.3 g/dL (ref 32.0–36.0)
MONO#: 0.3 10*3/uL (ref 0.1–0.9)
NEUT#: 1.8 10*3/uL (ref 1.5–6.5)
RBC: 4.03 10*6/uL — ABNORMAL LOW (ref 4.20–5.82)
WBC: 3.3 10*3/uL — ABNORMAL LOW (ref 4.0–10.3)
lymph#: 1.2 10*3/uL (ref 0.9–3.3)

## 2011-08-30 LAB — IRON AND TIBC: %SAT: 30 % (ref 20–55)

## 2011-08-30 LAB — COMPREHENSIVE METABOLIC PANEL
ALT: 28 U/L (ref 0–53)
Albumin: 4.4 g/dL (ref 3.5–5.2)
CO2: 27 mEq/L (ref 19–32)
Calcium: 9.1 mg/dL (ref 8.4–10.5)
Chloride: 106 mEq/L (ref 96–112)
Potassium: 4.2 mEq/L (ref 3.5–5.3)
Sodium: 140 mEq/L (ref 135–145)
Total Protein: 6.9 g/dL (ref 6.0–8.3)

## 2011-08-30 LAB — LACTATE DEHYDROGENASE: LDH: 192 U/L (ref 94–250)

## 2011-08-30 LAB — FERRITIN: Ferritin: 152 ng/mL (ref 22–322)

## 2011-09-01 ENCOUNTER — Telehealth: Payer: Self-pay | Admitting: Internal Medicine

## 2011-09-01 NOTE — Telephone Encounter (Signed)
Dr. Arline Asp called 9/12 @ 1:45pm about this mutual patient. Based on patient's issue with peri-rectal abscess, Dr. Arline Asp wants to know if you think he needs a referral to GI for a work up. Please call him on his cell this morning (9/13), which is the number I provided. Thanks.

## 2011-09-02 NOTE — Telephone Encounter (Signed)
Please notify patient that Dr. Arline Asp  and I discussed his case and thought a GI referral would be appropriate please notify patient and schedule with [facility] due to recurrent perirectal abscess

## 2011-09-02 NOTE — Telephone Encounter (Signed)
Left messages on answering machine

## 2011-09-02 NOTE — Telephone Encounter (Signed)
Spoke with pt - wants to speak with dr. Amador Cunas - feels sx are manageable and would prefer to speak with dr. Amador Cunas before and referrals are done - work - best time to call would be after 4pm today. KIK

## 2011-09-03 LAB — BCR/ABL (LIO MMD)

## 2011-09-13 LAB — DIFFERENTIAL
Band Neutrophils: 21 — ABNORMAL HIGH
Blasts: 4
Metamyelocytes Relative: 2
Monocytes Relative: 9
Myelocytes: 13
nRBC: 0

## 2011-09-13 LAB — CBC
MCV: 95.4
Platelets: 232
RBC: 4.09 — ABNORMAL LOW

## 2011-09-13 LAB — CHROMOSOME ANALYSIS, BONE MARROW

## 2011-09-13 LAB — TISSUE HYBRIDIZATION TO NCBH

## 2011-12-25 ENCOUNTER — Telehealth: Payer: Self-pay | Admitting: Oncology

## 2011-12-25 NOTE — Telephone Encounter (Signed)
lmonvm adviising the pt of his feb 2013 appts

## 2012-02-02 ENCOUNTER — Telehealth: Payer: Self-pay | Admitting: Oncology

## 2012-02-02 NOTE — Telephone Encounter (Signed)
l/m that 2/26 appt moved to 4/1(time for tx pt)          aom

## 2012-02-02 NOTE — Telephone Encounter (Signed)
pt called back & cannot come in 4/1 and have now r/s to sara on 3/14  aom

## 2012-02-15 ENCOUNTER — Other Ambulatory Visit: Payer: Self-pay | Admitting: *Deleted

## 2012-02-15 ENCOUNTER — Other Ambulatory Visit: Payer: BC Managed Care – PPO | Admitting: Lab

## 2012-02-15 ENCOUNTER — Ambulatory Visit: Payer: BC Managed Care – PPO | Admitting: Oncology

## 2012-02-15 MED ORDER — IMATINIB MESYLATE 400 MG PO TABS: 400.0000 mg | ORAL_TABLET | Freq: Every day | ORAL | Status: AC

## 2012-02-29 ENCOUNTER — Telehealth: Payer: Self-pay | Admitting: Oncology

## 2012-02-29 NOTE — Telephone Encounter (Signed)
appt for 3/14 moved to 3/27 due to SW out of office. Per pt he would like to r/s to 4/18 (next available AM) if ok w/DM. Pt aware i will check w/DM and call him back.

## 2012-03-02 ENCOUNTER — Other Ambulatory Visit: Payer: BC Managed Care – PPO | Admitting: Lab

## 2012-03-02 ENCOUNTER — Ambulatory Visit: Payer: BC Managed Care – PPO | Admitting: Physician Assistant

## 2012-03-03 ENCOUNTER — Telehealth: Payer: Self-pay | Admitting: Oncology

## 2012-03-03 NOTE — Telephone Encounter (Signed)
Pt's 3/14 appt was moved to 3/27 w/DM due to SW being on out of office. Pt could not do 3/27 and was r/s to 3/22 @ 9:15 am w/RJ. S/w pt today re new appt for 3/22 @ 8:45 am.

## 2012-03-06 ENCOUNTER — Encounter (INDEPENDENT_AMBULATORY_CARE_PROVIDER_SITE_OTHER): Payer: Self-pay | Admitting: Surgery

## 2012-03-10 ENCOUNTER — Telehealth: Payer: Self-pay | Admitting: Oncology

## 2012-03-10 ENCOUNTER — Other Ambulatory Visit (HOSPITAL_BASED_OUTPATIENT_CLINIC_OR_DEPARTMENT_OTHER): Payer: BC Managed Care – PPO | Admitting: Lab

## 2012-03-10 ENCOUNTER — Ambulatory Visit (HOSPITAL_BASED_OUTPATIENT_CLINIC_OR_DEPARTMENT_OTHER): Payer: BC Managed Care – PPO | Admitting: Physician Assistant

## 2012-03-10 VITALS — BP 119/80 | HR 59 | Temp 97.2°F | Ht 70.7 in | Wt 195.6 lb

## 2012-03-10 DIAGNOSIS — C9210 Chronic myeloid leukemia, BCR/ABL-positive, not having achieved remission: Secondary | ICD-10-CM

## 2012-03-10 LAB — CBC WITH DIFFERENTIAL/PLATELET
BASO%: 0.6 % (ref 0.0–2.0)
EOS%: 0.9 % (ref 0.0–7.0)
MCH: 33.5 pg — ABNORMAL HIGH (ref 27.2–33.4)
MCHC: 34.5 g/dL (ref 32.0–36.0)
MONO#: 0.2 10*3/uL (ref 0.1–0.9)
NEUT%: 58.8 % (ref 39.0–75.0)
RBC: 3.96 10*6/uL — ABNORMAL LOW (ref 4.20–5.82)
RDW: 13.2 % (ref 11.0–14.6)
WBC: 2.9 10*3/uL — ABNORMAL LOW (ref 4.0–10.3)
lymph#: 1 10*3/uL (ref 0.9–3.3)

## 2012-03-10 LAB — COMPREHENSIVE METABOLIC PANEL
ALT: 28 U/L (ref 0–53)
AST: 31 U/L (ref 0–37)
CO2: 25 mEq/L (ref 19–32)
Calcium: 9.2 mg/dL (ref 8.4–10.5)
Chloride: 108 mEq/L (ref 96–112)
Creatinine, Ser: 1.26 mg/dL (ref 0.50–1.35)
Potassium: 4.2 mEq/L (ref 3.5–5.3)
Sodium: 143 mEq/L (ref 135–145)
Total Protein: 6.6 g/dL (ref 6.0–8.3)

## 2012-03-10 LAB — IRON AND TIBC
%SAT: 22 % (ref 20–55)
TIBC: 387 ug/dL (ref 215–435)

## 2012-03-10 NOTE — Progress Notes (Signed)
This office note has been dictated.

## 2012-03-10 NOTE — Progress Notes (Signed)
CC:   [name], MD [name], MD [name], MD  INTERIM HISTORY:  Mr. 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 Since his last clinic visit, he continues on daily Gleevec.  He states he has had normal energy level and remains quite active and has been enjoying playing golf.  He is looking forward to traveling to the [place] next week.  He has had no fevers, chills, or night sweats.  No dyspnea or cough.  He has normal appetite, and has had no problems with nausea, vomiting, constipation, or diarrhea.  No dysuria, no frequency, or hematuria.  No alteration in sensation or balance or swelling of extremities.  CURRENT MEDICATIONS:  Reviewed and recorded.  PHYSICAL EXAM:  Temperature is 97.2, heart rate 59, respirations 20, blood pressure 119/80, weight 195.6 pounds.  General:  This is a well- developed, well-nourished, white male in no acute distress.  HEENT: Sclerae nonicteric.  There is no oral thrush or mucositis.  Skin:  No rashes or lesions.  Lymph:  No cervical, supraclavicular, axillary, or inguinal lymphadenopathy.  Cardiac:  Regular rate and rhythm without murmurs or gallops.  Peripheral pulses are 2+.  Chest:  Lungs clear to auscultation.  Abdomen:  Positive bowel sounds, soft, nontender, nondistended.  No organomegaly.  Extremities:  Without edema, cyanosis or calf tenderness.  Neurologic:  Alert and oriented x3.  Strength, sensation, and coordination all grossly intact.  LABORATORY DATA:  CBC with diff reveals white blood count of 2.9, hemoglobin 13.2, hematocrit 38.3, platelets of 150, ANC of 1.7 and MCV of 96.9.  CMET, LDH, and BCR-ABL are currently pending.  IMPRESSION/PLAN: 1. Mr. Grimaldo is a 46 year old white male with chronic     granulocytic leukemia diagnosed in February 2009.  He is currently     maintained on Gleevec 400 mg p.o. daily and has had a stable BCR-    ABL on this regimen.  He has no sense of ill health.  His BCR-ABL     is pending, and he will be contacted with results. 2. The patient will be scheduled for followup with Dr. Arline Asp in 4     months' time at which time we will reassess CBC with diff, CMET,     LDH, and BCR-ABL.  We will also assess iron studies.  Patient is     advised to call in the interim if any questions or problems.    ______________________________ Michail Sermon, MSN, ANP, BC RH/MEDQ  D:  03/10/2012  T:  03/10/2012  Job:  [id number]

## 2012-03-10 NOTE — Telephone Encounter (Signed)
gv pt appt for [id number] 

## 2012-03-14 ENCOUNTER — Encounter: Payer: Self-pay | Admitting: Oncology

## 2012-03-14 LAB — BCR/ABL (LIO MMD)

## 2012-03-14 NOTE — Progress Notes (Signed)
Quantitative RT-PCR for BCR/ABL1 carried out on 03/10/12 showed no detectable transcripts indicating at least a 4 log reduction.

## 2012-03-15 ENCOUNTER — Ambulatory Visit: Payer: BC Managed Care – PPO | Admitting: Oncology

## 2012-03-15 ENCOUNTER — Other Ambulatory Visit: Payer: BC Managed Care – PPO

## 2012-03-20 ENCOUNTER — Other Ambulatory Visit: Payer: BC Managed Care – PPO | Admitting: Lab

## 2012-03-20 ENCOUNTER — Ambulatory Visit: Payer: BC Managed Care – PPO | Admitting: Oncology

## 2012-05-22 ENCOUNTER — Telehealth: Payer: Self-pay | Admitting: Medical Oncology

## 2012-05-22 NOTE — Telephone Encounter (Signed)
Pt called and states that he received a check for payment of his lab services that Dr. Arline Asp has ordered. He is not sure whom to send the check or speak with about this issue. I gave pt the number to billing. I asked him to call back if any further questions.

## 2012-06-16 ENCOUNTER — Telehealth: Payer: Self-pay | Admitting: Internal Medicine

## 2012-06-16 MED ORDER — CEPHALEXIN 500 MG PO TABS: 500.0000 mg | ORAL_TABLET | Freq: Four times a day (QID) | ORAL | Status: AC

## 2012-06-16 NOTE — Telephone Encounter (Signed)
Cephalexin 500 #40 one 4 times a day;   soaks the foot in warm water 3-4 times daily;  Saturday clinic in the morning if worse.

## 2012-06-16 NOTE — Telephone Encounter (Signed)
Patient is aware and Rx sent 

## 2012-06-16 NOTE — Telephone Encounter (Signed)
  Caller: Michael [name]; PCP: Eleonore Chiquito; CB#: [phone]; ; ; Call regarding Ingrown Toenail L foot.   SX started 2 weeks ago and cannot get into Podiatrist until 07/03/12. It is painful and looks infected.  Triaged Foot non injury and disp = needs to be seen in the E/R for eval. as "infected" area is swollen.  Weight bearing is limited.  Pt ha "worked" on this toe himself about 2 weeks ago trying to lance it and now he is having problems with weight bearing and pain.  Please call pt is this can be addressed in the ofc, or if her needs to go somewhere else to be seen.

## 2012-06-21 ENCOUNTER — Encounter: Payer: Self-pay | Admitting: Oncology

## 2012-07-06 ENCOUNTER — Telehealth: Payer: Self-pay | Admitting: Oncology

## 2012-07-06 ENCOUNTER — Other Ambulatory Visit: Payer: Self-pay | Admitting: Nurse Practitioner

## 2012-07-06 NOTE — Telephone Encounter (Signed)
l/m with appt change from dm to jh per dr dm  aom

## 2012-07-10 ENCOUNTER — Ambulatory Visit: Payer: BC Managed Care – PPO | Admitting: Oncology

## 2012-07-10 ENCOUNTER — Other Ambulatory Visit: Payer: BC Managed Care – PPO | Admitting: Lab

## 2012-07-12 ENCOUNTER — Telehealth: Payer: Self-pay | Admitting: Oncology

## 2012-07-12 NOTE — Telephone Encounter (Signed)
Pt called today to r/s 8/1 appt to 8/8.

## 2012-07-20 ENCOUNTER — Ambulatory Visit: Payer: BC Managed Care – PPO | Admitting: Family

## 2012-07-20 ENCOUNTER — Other Ambulatory Visit: Payer: BC Managed Care – PPO | Admitting: Lab

## 2012-07-26 ENCOUNTER — Telehealth: Payer: Self-pay

## 2012-07-26 NOTE — Telephone Encounter (Signed)
l/m with nerw appt that was r/s from 8/8 to 8/13

## 2012-07-27 ENCOUNTER — Ambulatory Visit: Payer: BC Managed Care – PPO | Admitting: Family

## 2012-07-27 ENCOUNTER — Other Ambulatory Visit: Payer: BC Managed Care – PPO | Admitting: Lab

## 2012-07-27 ENCOUNTER — Telehealth: Payer: Self-pay | Admitting: Oncology

## 2012-07-27 NOTE — Telephone Encounter (Signed)
s/w pt and per his req we r/s his appt to 8/26

## 2012-07-31 ENCOUNTER — Ambulatory Visit: Payer: BC Managed Care – PPO | Admitting: Family

## 2012-07-31 ENCOUNTER — Other Ambulatory Visit: Payer: BC Managed Care – PPO | Admitting: Lab

## 2012-08-14 ENCOUNTER — Telehealth: Payer: Self-pay | Admitting: Oncology

## 2012-08-14 ENCOUNTER — Encounter: Payer: Self-pay | Admitting: Family

## 2012-08-14 ENCOUNTER — Ambulatory Visit (HOSPITAL_BASED_OUTPATIENT_CLINIC_OR_DEPARTMENT_OTHER): Payer: BC Managed Care – PPO | Admitting: Family

## 2012-08-14 ENCOUNTER — Other Ambulatory Visit (HOSPITAL_BASED_OUTPATIENT_CLINIC_OR_DEPARTMENT_OTHER): Payer: BC Managed Care – PPO | Admitting: Lab

## 2012-08-14 VITALS — BP 130/96 | HR 56 | Temp 97.4°F | Resp 20 | Ht 70.7 in | Wt 191.2 lb

## 2012-08-14 DIAGNOSIS — R1032 Left lower quadrant pain: Secondary | ICD-10-CM

## 2012-08-14 DIAGNOSIS — C9210 Chronic myeloid leukemia, BCR/ABL-positive, not having achieved remission: Secondary | ICD-10-CM

## 2012-08-14 DIAGNOSIS — R161 Splenomegaly, not elsewhere classified: Secondary | ICD-10-CM

## 2012-08-14 LAB — CBC WITH DIFFERENTIAL/PLATELET
BASO%: 0.3 % (ref 0.0–2.0)
Eosinophils Absolute: 0 10*3/uL (ref 0.0–0.5)
HCT: 36.3 % — ABNORMAL LOW (ref 38.4–49.9)
MCHC: 35.3 g/dL (ref 32.0–36.0)
MONO#: 0.2 10*3/uL (ref 0.1–0.9)
NEUT#: 2 10*3/uL (ref 1.5–6.5)
RBC: 3.94 10*6/uL — ABNORMAL LOW (ref 4.20–5.82)
WBC: 3.7 10*3/uL — ABNORMAL LOW (ref 4.0–10.3)
lymph#: 1.4 10*3/uL (ref 0.9–3.3)

## 2012-08-14 LAB — COMPREHENSIVE METABOLIC PANEL (CC13)
AST: 33 U/L (ref 5–34)
Alkaline Phosphatase: 87 U/L (ref 40–150)
BUN: 17 mg/dL (ref 7.0–26.0)
Creatinine: 1.3 mg/dL (ref 0.7–1.3)
Total Bilirubin: 0.6 mg/dL (ref 0.20–1.20)

## 2012-08-14 LAB — IRON AND TIBC
%SAT: 22 % (ref 20–55)
Iron: 81 ug/dL (ref 42–165)
TIBC: 369 ug/dL (ref 215–435)

## 2012-08-14 LAB — FERRITIN: Ferritin: 160 ng/mL (ref 22–322)

## 2012-08-14 NOTE — Patient Instructions (Signed)
Patient ID: Michael [name],   DOB: 1966-09-16,  MRN: [id number]   [facility] Discharge Instructions  RECOMMENDATIONS MAD BY THE CONSULTANT AND ANY TEST RESULT(S) WILL BE FORWARDED TO YOU REFERRING DOCTOR   EXAM FINDINGS BY NURSE PRACTITIONER TODAY TO REPORT TO THE CLINIC OR PRIMARY PROVIDER: N/A   Your Current Medications Are: Current Outpatient Prescriptions  Medication Sig Dispense Refill  . imatinib (GLEEVEC) 400 MG tablet Take 400 mg by mouth daily. Take with meals and large glass of water.Caution:Chemotherapy.         INSTRUCTIONS GIVEN, DISCUSSED AND FOLLOW-UP: Keep up the great work  I acknowledge that I have been informed and understand all the instructions given to me and have received a copy.  I do not have any further questions at this time, but I understand that I may call the [facility] at [phone] during business hours should I have any further questions or need assistance in obtaining follow-up care.   08/14/2012, 4:45 PM

## 2012-08-14 NOTE — Telephone Encounter (Signed)
gv pt appt schedule for December and ct for 9/3.

## 2012-08-14 NOTE — Progress Notes (Signed)
Patient ID: Michael [name], male   DOB: Aug 04, 1966, 46 y.o.   MRN: [id number] CSN: [id number]  CC: [name], MD  [name], MD  [name], MD  Problem List: Tahsin Benyo is a 46 y.o. Caucasian male with a problem list consisting of:  1.  Chronic granulocytic leukemia 2.  CML 3.  Splenomegaly 4.  Hypertriglyceridemia 5.  Recurrent anal fistula  Mr. 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 Since his last clinic visit on 03/10/2012, the patient continues on daily Gleevec. He states he has had normal energy level, remains quite active and has been enjoying playing golf.  He denies fevers, chills, night sweats or pain.  The patient states that he has had some abdominal discomfort in his LLQ with exertion that comes after he does abdominal crunches.  The patient states that the pain is relieved when he lies in the supine position.  The patient states he has normal appetite, no problems with nausea, vomiting, constipation, or diarrhea. No dysuria, no frequency, or hematuria.  The patient states that overall he feels well except for the abdominal discomfort which seems to be flaring-up more frequently (weekly).  Let the patient know that I will be ordering a CT of the Abd/Pelvis with contrast.  The patient states that his anal fistula is doing better but he does have occasional rectal bleeding.  Spoke to the patient about having a colonoscopy as well.  He will consider this procedure.  Past Medical History: Past Medical History  Diagnosis Date  . 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 Leukocytosis, unspecified   . Splenomegaly   . Anal fistula     Surgical History: Past Surgical History  Procedure Date  . Refractive surgery 2000  . Tonsilectomy, adenoidectomy, bilateral myringotomy and tubes     age 46    Current Medications: Current Outpatient  Prescriptions  Medication Sig Dispense Refill  . imatinib (GLEEVEC) 400 MG tablet Take 400 mg by mouth daily. Take with meals and large glass of water.Caution:Chemotherapy.       Allergies: No Known Allergies   Family History: Family History  Problem Relation Age of Onset  . Cancer Father 81    prostate    Social History: History  Substance Use Topics  . Smoking status: Never Smoker   . Smokeless tobacco: Not on file  . Alcohol Use: Yes     occassional    Review of Systems: 10 Point review of systems was completed and is negative except as noted above.   Physical Exam:   There were no vitals taken for this visit.  General appearance: Alert, cooperative, well nourished, no apparent distress Head: Normocephalic, without obvious abnormality, atraumatic Eyes: Conjunctivae/corneas clear, PERRLA, EOMI  Nose: Nares, septum and mucosa are normal,  no drainage or sinus tenderness Neck: No adenopathy, supple, symmetrical, trachea midline, thyroid not enlarged,  no tenderness Resp: Clear to auscultation bilaterally Cardio: Regular rate and rhythm, S1, S2 normal, no murmur, click, rub or gallop GI: soft,non-distended, non-tender; bowel sounds normal, no masses Extremities: Extremities normal, atraumatic, no cyanosis or edema Lymph nodes: Cervical, supraclavicular, and axillary nodes normal Neurologic: Grossly normal   Laboratory Data: Results for orders placed in visit on 08/14/12 (from the past 48 hour(s))  CBC WITH DIFFERENTIAL     Status: Abnormal   Collection Time   08/14/12  3:56 PM      Component Value  Range Comment   WBC 3.7 (*) 4.0 - 10.3 10e3/uL    NEUT# 2.0  1.5 - 6.5 10e3/uL    HGB 12.8 (*) 13.0 - 17.1 g/dL    HCT 30.8 (*) 65.7 - 49.9 %    Platelets 130 (*) 140 - 400 10e3/uL    MCV 92.1  79.3 - 98.0 fL    MCH 32.5  27.2 - 33.4 pg    MCHC 35.3  32.0 - 36.0 g/dL    RBC 8.46 (*) 9.62 - 5.82 10e6/uL    RDW 12.6  11.0 - 14.6 %    lymph# 1.4  0.9 - 3.3 10e3/uL     MONO# 0.2  0.1 - 0.9 10e3/uL    Eosinophils Absolute 0.0  0.0 - 0.5 10e3/uL    Basophils Absolute 0.0  0.0 - 0.1 10e3/uL    NEUT% 55.4  39.0 - 75.0 %    LYMPH% 37.2  14.0 - 49.0 %    MONO% 6.3  0.0 - 14.0 %    EOS% 0.8  0.0 - 7.0 %    BASO% 0.3  0.0 - 2.0 %   COMPREHENSIVE METABOLIC PANEL (CC13)     Status: Abnormal   Collection Time   08/14/12  3:56 PM      Component Value Range Comment   Sodium 140  136 - 145 mEq/L    Potassium 3.6  3.5 - 5.1 mEq/L    Chloride 109 (*) 98 - 107 mEq/L    CO2 22  22 - 29 mEq/L    Glucose 138 (*) 70 - 99 mg/dl    BUN 95.2  7.0 - 84.1 mg/dL    Creatinine 1.3  0.7 - 1.3 mg/dL    Total Bilirubin 3.24  0.20 - 1.20 mg/dL    Alkaline Phosphatase 87  40 - 150 U/L    AST 33  5 - 34 U/L    Total Protein 6.7  6.4 - 8.3 g/dL    ALT 31  0 - 55 U/L    Albumin 4.2  3.5 - 5.0 g/dL    Calcium 8.9  8.4 - 40.1 mg/dL   LACTATE DEHYDROGENASE (CC13)     Status: Abnormal   Collection Time   08/14/12  3:56 PM      Component Value Range Comment   LDH 225 (*) 125 - 220 U/L      Imaging Studies: 02/12/2008 CT of the Abdomen with Contrast -IMPRESSION: Marked splenomegaly. There is no retroperitoneal adenopathy associated with this and no abnormality (aside from a small hepatic cyst) associated with the liver. The possibility of a myeloproliferative process exists. Otherwise negative CT of the abdomen. 02/12/2008 CT of the Pelvis with Contrast  IMPRESSION: Negative CT of the pelvis   Impression/Plan: Mr. Michael [name] is a 46 year old Caucasian male with chronic granulocytic leukemia diagnosed in February 2009. He is currently maintained on Gleevec 400 mg PO daily and has had a stable BCR-ABL on this regimen.  The BCR-ABL, Ferritin and Iron/TIBC  drawn today are pending at this time and the patient will be contacted with results.  Because the patient has a history of splenomegaly and has current abdominal pain, a CT of the Abd/Pelvis has been ordered.  The patient was also  asked to consider a colonoscopy with his history of an anal fistula, occasional rectal bleeding and a family history of cancer.  The patient will be scheduled for follow up with Dr. Arline Asp in 4 months' time at which time we will  reassess CBC, CMP, LDH, BCR-ABL and iron studies.  Mr. Mineer was asked to call in the interim if he has any questions or concerns.    Denver Harder NP-C 08/14/2012, 4:16 PM

## 2012-08-22 ENCOUNTER — Ambulatory Visit (HOSPITAL_COMMUNITY)
Admission: RE | Admit: 2012-08-22 | Discharge: 2012-08-22 | Disposition: A | Discharge status: Home / Self Care | Payer: BC Managed Care – PPO | Source: Ambulatory Visit | Attending: Family | Admitting: Family

## 2012-08-22 DIAGNOSIS — K7689 Other specified diseases of liver: Secondary | ICD-10-CM | POA: Insufficient documentation

## 2012-08-22 DIAGNOSIS — R161 Splenomegaly, not elsewhere classified: Secondary | ICD-10-CM | POA: Insufficient documentation

## 2012-08-22 DIAGNOSIS — I700 Atherosclerosis of aorta: Secondary | ICD-10-CM | POA: Insufficient documentation

## 2012-08-22 DIAGNOSIS — R109 Unspecified abdominal pain: Secondary | ICD-10-CM | POA: Insufficient documentation

## 2012-08-22 MED ORDER — IOHEXOL 300 MG/ML  SOLN
100.0000 mL | Freq: Once | INTRAMUSCULAR | Status: AC | PRN
  Administered 2012-08-22: 100 mL via INTRAVENOUS

## 2012-08-23 ENCOUNTER — Telehealth: Payer: Self-pay | Admitting: Medical Oncology

## 2012-08-23 NOTE — Telephone Encounter (Signed)
Called pt per Dr. Arline Asp with CT scan results from 08/22/12

## 2012-08-23 NOTE — Progress Notes (Signed)
Quick Note:  Please notify patient and call/fax these results to patient's doctors. ______ 

## 2012-08-29 ENCOUNTER — Telehealth: Payer: Self-pay

## 2012-08-29 NOTE — Telephone Encounter (Signed)
S/w pt that his CT on 08/22/12 was OK per DSM

## 2012-09-13 ENCOUNTER — Encounter (INDEPENDENT_AMBULATORY_CARE_PROVIDER_SITE_OTHER): Payer: Self-pay | Admitting: Surgery

## 2012-10-09 ENCOUNTER — Ambulatory Visit (INDEPENDENT_AMBULATORY_CARE_PROVIDER_SITE_OTHER): Payer: BC Managed Care – PPO | Admitting: Surgery

## 2012-10-09 ENCOUNTER — Encounter (INDEPENDENT_AMBULATORY_CARE_PROVIDER_SITE_OTHER): Payer: Self-pay | Admitting: Surgery

## 2012-10-09 VITALS — BP 114/72 | HR 60 | Temp 97.7°F | Resp 16 | Ht 71.0 in | Wt 192.0 lb

## 2012-10-09 DIAGNOSIS — C9210 Chronic myeloid leukemia, BCR/ABL-positive, not having achieved remission: Secondary | ICD-10-CM

## 2012-10-09 DIAGNOSIS — K603 Anal fistula: Secondary | ICD-10-CM

## 2012-10-09 DIAGNOSIS — K604 Rectal fistula: Secondary | ICD-10-CM

## 2012-10-09 NOTE — Progress Notes (Signed)
Subjective:     Patient ID: Michael [name], male   DOB: 1966-01-18, 46 y.o.   MRN: [id number]  HPI  Michael [name]  08/23/1966 [id number]  Patient Care Team: [name], MD as PCP - General [name], MD as Consulting Physician (Hematology and Oncology)  This patient is a 46 y.o.male who presents today for surgical evaluation.   Reason for evaluation: Persistent intermittent perianal drainage and discomfort.  Suspicious for fistula.  Pleasant male.  History of perirectal abscess requiring incision and drainage.  Intermittent patent inflammation and drainage.  Improved with antibiotics.  I have not seen him in over a year.  He had declined surgery in the past.  Was opened the idea if it did not resolve or if her recurrent.  He notes that it is starting to drain again.  He will have discomfort and then drainage which will feel better.  Happens several times a month.  Never really resolved.  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  In stable remission on the maintenance dose.  Had splenomegaly.  That has since resolved.Has bowel movements every day.  Never had colonoscopy.  No personal nor family history of GI/colon cancer, inflammatory bowel disease, irritable bowel syndrome, allergy such as Celiac Sprue, dietary/dairy problems, colitis, ulcers nor gastritis.  No recent sick contacts/gastroenteritis.  No travel outside the country.  No changes in diet.    Patient Active Problem List  Diagnosis  . ANEMIA  . 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 Perirectal fistula    Past Medical History  Diagnosis Date  . 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 Leukocytosis, unspecified   . Splenomegaly     resolved CT Sep2013  . Anal fistula   . Liver cyst     Left hepatic dome 2 cm cyst, CT [id number]    Past Surgical History  Procedure Date  . Refractive surgery 2000  .  Tonsilectomy, adenoidectomy, bilateral myringotomy and tubes     age 46    History   Social History  . Marital Status: Married    Spouse Name: N/A    Number of Children: N/A  . Years of Education: N/A   Occupational History  . Not on file.   Social History Main Topics  . Smoking status: Never Smoker   . Smokeless tobacco: Not on file  . Alcohol Use: No  . Drug Use: No  . Sexually Active: Not on file   Other Topics Concern  . Not on file   Social History Narrative  . No narrative on file    Family History  Problem Relation Age of Onset  . Cancer Father 38    prostate  . Heart disease Mother     Current Outpatient Prescriptions  Medication Sig Dispense Refill  . imatinib (GLEEVEC) 400 MG tablet Take 400 mg by mouth daily. Take with meals and large glass of water.Caution:Chemotherapy.         No Known Allergies  BP 114/72  Pulse 60  Temp 97.7 F (36.5 C) (Temporal)  Resp 16  Ht 5\' 11"  (1.803 m)  Wt 192 lb (87.091 kg)  BMI 26.78 kg/m2  No results found.   Review of Systems  Constitutional: Negative for fever, chills and diaphoresis.  HENT: Negative for sore throat, trouble swallowing and neck pain.   Eyes: Negative for photophobia and visual disturbance.  Respiratory: Negative for choking and shortness of breath.  Cardiovascular: Negative for chest pain and palpitations.  Gastrointestinal: Positive for rectal pain. Negative for nausea, vomiting, abdominal pain, diarrhea, constipation, blood in stool, abdominal distention and anal bleeding.  Genitourinary: Negative for dysuria, urgency, difficulty urinating and testicular pain.  Musculoskeletal: Negative for myalgias, arthralgias and gait problem.  Skin: Negative for color change and rash.  Neurological: Negative for dizziness, speech difficulty, weakness and numbness.  Hematological: Negative for adenopathy.  Psychiatric/Behavioral: Negative for hallucinations, confusion and agitation.         Objective:   Physical Exam  Constitutional: He is oriented to person, place, and time. He appears well-developed and well-nourished. No distress.  HENT:  Head: Normocephalic.  Mouth/Throat: Oropharynx is clear and moist. No oropharyngeal exudate.  Eyes: Conjunctivae normal and EOM are normal. Pupils are equal, round, and reactive to light. No scleral icterus.  Neck: Normal range of motion. No tracheal deviation present.  Cardiovascular: Normal rate, normal heart sounds and intact distal pulses.   Pulmonary/Chest: Effort normal. No respiratory distress.  Abdominal: Soft. He exhibits no distension. There is no tenderness. Hernia confirmed negative in the right inguinal area and confirmed negative in the left inguinal area.       Incisions clean with normal healing ridges.  No hernias  Genitourinary: Prostate normal and penis normal. No penile tenderness.       Exam done with assistance of male Medical Assistant in the room.  Left anterior scar w some blistering suspicious for chronic fistula  Perianal skin clean with good hygiene.  No pruritis.  No external skin tags / hemorrhoids of significance.  No pilonidal disease.  No fissure.  Tolerates digital rectal exam.  Normal sphincter tone.  No rectal masses.  Hemorrhoidal piles WNL   Musculoskeletal: Normal range of motion. He exhibits no tenderness.  Neurological: He is alert and oriented to person, place, and time. No cranial nerve deficit. He exhibits normal muscle tone. Coordination normal.  Skin: Skin is warm and dry. No rash noted. He is not diaphoretic.  Psychiatric: He has a normal mood and affect. His behavior is normal.       Assessment:     Firm perianal scarring with intermittent drainage and discomfort, strongly suspicious for perirectal fistula in a patient who is chronically immunosuppressed by Gleevec chemotherapy    Plan:     And at this point, I think he requires examination under anesthesia to get a better sense of  anatomy.  At the very least, I would excise the scar.  I will be surprised if he does not have some type of fistula.  Suspect it will be intersphincteric requiring ligation of intersphincteric fistulous tract (LIFT) to avoid seton until last option:  The anatomy & physiology of the anorectal region was discussed.  We discussed the pathophysiology of anorectal abscess and fistula.  Differential diagnosis was discussed.  Natural history progression was discussed.   I stressed the importance of a bowel regimen to have daily soft bowel movements to minimize progression of disease.     The patient's condition is not adequately controlled.  Non-operative treatment has not healed the fistula.  Therefore, I recommended examination under anaesthesia to confirm the diagnosis and treat the fistula.  I discussed techniques that may be required such as fistulotomy, ligation by LIFT technique, and/or seton placement.  Benefits & alternatives discussed.  I noted a good likelihood this will help address the problem, but sometimes repeat operations and prolonged healing times may occur.  Risks such as bleeding, pain, recurrence,  reoperation, incontinence, heart attack, death, and other risks were discussed.      Educational handouts further explaining the pathology, treatment options, and bowel regimen were given.  The patient expressed understanding & wishes to proceed.  We will work to coordinate surgery for a mutually convenient time.   At some point, he would benefit from an endoscopy to route colon polyp.  My suspicion for Crohn's / inflammatory bowel disease or other GI pathology is low.

## 2012-10-09 NOTE — Patient Instructions (Addendum)
See the Handout(s) we gave you.  Consider surgery.  Please call our office at [phone] if you wish to schedule surgery or if you have further questions / concerns.   Anal Fistula An anal fistula is an abnormal tunnel that leads from the anal canal (which carries stool from the large intestine) to a hole in the skin near the anus (the opening through which stool passes out of your body).  CAUSES  Food you eat goes from your stomach into your intestine. As the food is digested, waste material (stool) forms. Stool passes through your large intestine, through the rectum and anal canal, and out of your body through the anus.  The anus has a number of tiny glands (clusters of specialized cells) that make lubricating fluid. Sometimes these glands can become infected. This type of infection may lead to the development of a pocket of pus (abscess). An anal fistula often develops after an infection or abscess; It is nearly always caused by a past anorectal abscess. You are at a higher risk of developing an anal fistula if you have:  Had an anal abscess.  Chronic inflammatory bowel disease, such as Crohn's disease or ulcerative colitis.  Conditions in which there are inflamed outpouchings of the intestinal wall (diverticulitis).  Colon or rectal cancer.  Sexually transmitted diseases involving the rectum, such as gonorrhea or chlamydia.  A history of anal radiation treatments, injury, or surgery.  An HIV infection.  A problem that has required treatment with steroid medicines for more than a short time. SYMPTOMS   Anal pain, particularly around the area of a past abscess.  Drainage of pus, blood, stool or mucus from an opening in the skin.  Swelling around the skin opening.  Worn off skin around the opening.  A hot or red area near the anus.  Diarrhea.  Fever and chills.  Tiredness (fatigue). DIAGNOSIS   In some cases, the opening of an anal fistula is easily seen during a  physical exam.  A probe or scope may be used to help locate the opening of the fistula. In some cases, dye can be injected into the fistula opening, and X-rays can be taken to find the exact location and path of the fistula.  A sample (biopsy) of the fistula tissue or anus may be taken to check for cancer. TREATMENT   An anal fistula may need surgery to open it up and allow it to heal. This type of operation is called a fistulotomy.  A specialized kind of glue or plug to seal the fistula may be used.  An antibiotic may be prescribed to treat an existing infection. HOME CARE INSTRUCTIONS   Take medications (such as antibiotics) as prescribed by your caregiver.  Only take over-the-counter or prescription medicine for pain, discomfort, or fever as directed by your caregiver.  Follow your prescribed diet. You may need a higher fiber diet to help avoid constipation.  Drink lots of water as directed.  Use a stool softener or laxative, if recommended.  A warm sitz bath several times a day may be soothing, as well as help with healing.  Follow excellent hygiene to keep the anal area as clean as possible. Consider using pre-moistened towelettes to keep the anal area clean after using the bathroom. SEEK MEDICAL CARE IF:  You have increased pain not controlled with medications.  You notice new swelling, redness, or hotness in the anal area.  You develop any problems passing urine.  You develop a fever (  more than 100.5 F (38.1 C). SEEK IMMEDIATE MEDICAL CARE IF:  You have severe, intolerable pain.  You have severe problems passing urine or cannot pass any urine at all.  You develop an unexplained oral temperature above 102.0 F (38.9 C).  You notice new or worsening leakage of blood, pus, mucus, or stool. Document Released: 11/18/2008 Document Revised: 02/28/2012 Document Reviewed: 11/18/2008 [facility] Patient Information 2013 [place], [place].

## 2012-11-23 ENCOUNTER — Other Ambulatory Visit (INDEPENDENT_AMBULATORY_CARE_PROVIDER_SITE_OTHER): Payer: Self-pay | Admitting: Surgery

## 2012-11-23 DIAGNOSIS — K603 Anal fistula: Secondary | ICD-10-CM

## 2012-12-01 ENCOUNTER — Telehealth (INDEPENDENT_AMBULATORY_CARE_PROVIDER_SITE_OTHER): Payer: Self-pay | Admitting: General Surgery

## 2012-12-01 ENCOUNTER — Telehealth: Payer: Self-pay | Admitting: Oncology

## 2012-12-01 NOTE — Telephone Encounter (Signed)
Pt called for reassurance following rectal surgery.  He is still having "a fair amount of drainage" and the "area is still very tender."  Pt affirms he is using bath soaks at least twice a day.  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  Suggested he try using a maxi-pad for absorption and odor control.  Reinforced to continue the warm soaks and symptoms of drainage and tenderness are to be expected for now.  He was glad to hear there was nothing out of the ordinary and will follow-up next week with Dr. Michaell Cowing.

## 2012-12-01 NOTE — Telephone Encounter (Signed)
opt called and is going out of town and needed to reschedule appt.Michael KitchenMarland Kitchenpt wanted 1.31.14

## 2012-12-05 ENCOUNTER — Ambulatory Visit: Payer: BC Managed Care – PPO | Admitting: Oncology

## 2012-12-05 ENCOUNTER — Other Ambulatory Visit: Payer: BC Managed Care – PPO | Admitting: Lab

## 2012-12-06 ENCOUNTER — Encounter (INDEPENDENT_AMBULATORY_CARE_PROVIDER_SITE_OTHER): Payer: Self-pay | Admitting: Surgery

## 2012-12-06 ENCOUNTER — Ambulatory Visit (INDEPENDENT_AMBULATORY_CARE_PROVIDER_SITE_OTHER): Payer: BC Managed Care – PPO | Admitting: Surgery

## 2012-12-06 VITALS — BP 118/78 | HR 76 | Temp 97.8°F | Resp 16 | Ht 71.0 in | Wt 189.1 lb

## 2012-12-06 DIAGNOSIS — K603 Anal fistula: Secondary | ICD-10-CM

## 2012-12-06 DIAGNOSIS — K604 Rectal fistula: Secondary | ICD-10-CM

## 2012-12-06 NOTE — Progress Notes (Signed)
Subjective:     Patient ID: Michael [name], male   DOB: March 28, 1966, 46 y.o.   MRN: [id number]  HPI  Michael [name]  December 14, 1966 [id number]  Patient Care Team: [name], MD as PCP - General [name], MD as Consulting Physician (Hematology and Oncology)  This patient is a 46 y.o.male who presents today for surgical evaluation s/p LIFT procedure for perirectal intersphincteric fistula.  FINAL DIAGNOSIS Diagnosis Anus, fistula, perianal - BENIGN SQUAMOUS MUCOSA WITH A FULL THICKNESS DEFECT SHOWING ACUTE AND CHRONIC INFLAMMATION AND FOCAL ABSCESS FORMATION (CLINICALLY FISTULA FORMATION). - NO DYSPLASIA, ATYPIA OR MALIGNANCY IDENTIFIED. [name] MD Pathologist, Electronic Signature (Case signed 11/27/2012) Specimen Michael [name] and Clinical Information Specimen Comment Fistulous track Specimen(s) Obtained: Anus, fistula, perianal Specimen Clinical  Patient comes today by himself.  The packing in the gluteal wound gradually fell out.  Struggles with fecal urgency and small loose stools throughout the day.  No problem at night.  Issues of leaking - wearing a pad.  Calming down a little bit.  Surprised he is still sore.  Never really took any narcotics.  Wanted to get back to working and taking care of his family with driving.  Doesn't want to take oxycodone.  Cannot sit for long periods of time.  Taking one ibuprofen about every other day.  No fevers or chills.  No bleeding.  Using warm baths twice a day and that helps the most.  Patient Active Problem List  Diagnosis  . ANEMIA  . 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 Perirectal fistula    Past Medical History  Diagnosis Date  . 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 Leukocytosis, unspecified   . Splenomegaly     resolved CT Sep2013  . Anal fistula   . Liver cyst     Left hepatic dome 2 cm cyst, CT [id number]    Past Surgical History    Procedure Date  . Refractive surgery 2000  . Tonsilectomy, adenoidectomy, bilateral myringotomy and tubes     age 46    History   Social History  . Marital Status: Married    Spouse Name: N/A    Number of Children: N/A  . Years of Education: N/A   Occupational History  . Not on file.   Social History Main Topics  . Smoking status: Never Smoker   . Smokeless tobacco: Never Used  . Alcohol Use: No  . Drug Use: No  . Sexually Active: Not on file   Other Topics Concern  . Not on file   Social History Narrative  . No narrative on file    Family History  Problem Relation Age of Onset  . Cancer Father 78    prostate  . Heart disease Mother     Current Outpatient Prescriptions  Medication Sig Dispense Refill  . imatinib (GLEEVEC) 400 MG tablet Take 400 mg by mouth daily. Take with meals and large glass of water.Caution:Chemotherapy.         No Known Allergies  BP 118/78  Pulse 76  Temp 97.8 F (36.6 C) (Temporal)  Resp 16  Ht 5\' 11"  (1.803 m)  Wt 189 lb 2 oz (85.787 kg)  BMI 26.38 kg/m2  No results found.   Review of Systems  Constitutional: Negative for fever, chills and diaphoresis.  HENT: Negative for sore throat, trouble swallowing and neck pain.   Eyes: Negative for photophobia and visual disturbance.  Respiratory: Negative for  choking and shortness of breath.   Cardiovascular: Negative for chest pain and palpitations.  Gastrointestinal: Positive for diarrhea, anal bleeding and rectal pain. Negative for nausea, vomiting, abdominal pain, constipation, blood in stool and abdominal distention.  Genitourinary: Negative for dysuria, urgency, difficulty urinating and testicular pain.  Musculoskeletal: Negative for myalgias, arthralgias and gait problem.  Skin: Negative for color change and rash.  Neurological: Negative for dizziness, speech difficulty, weakness and numbness.  Hematological: Negative for adenopathy.  Psychiatric/Behavioral: Negative for  hallucinations, confusion and agitation.       Objective:   Physical Exam  Constitutional: He is oriented to person, place, and time. He appears well-developed and well-nourished. No distress.  HENT:  Head: Normocephalic.  Mouth/Throat: Oropharynx is clear and moist. No oropharyngeal exudate.  Eyes: Conjunctivae normal and EOM are normal. Pupils are equal, round, and reactive to light. No scleral icterus.  Neck: Normal range of motion. No tracheal deviation present.  Cardiovascular: Normal rate, normal heart sounds and intact distal pulses.   Pulmonary/Chest: Effort normal. No respiratory distress.  Abdominal: Soft. He exhibits no distension. There is no tenderness. Hernia confirmed negative in the right inguinal area and confirmed negative in the left inguinal area.  Genitourinary:     Musculoskeletal: Normal range of motion. He exhibits no tenderness.  Neurological: He is alert and oriented to person, place, and time. No cranial nerve deficit. He exhibits normal muscle tone. Coordination normal.  Skin: Skin is warm and dry. No rash noted. He is not diaphoretic.  Psychiatric: He has a normal mood and affect. His behavior is normal.       Assessment:     Slowly healed  Poor pain control    Plan:     Increase activity as tolerated to regular activity.  Do not push through pain.  NSAID round the clock, oxycodone more if needed.  I stressed to him the importance of good pain control for faster recovery.  I tried to convince him that he deserves better pain control than one ibuprofen every other day.  It took him a while to accept that concept after re-explaining it, but I think that he understands.  Continue warm soaks  Diet as tolerated. Bowel regimen to avoid problems. - Try imodium / pectin to help thicken & slow down stools.  The fecal urgency is probably due to the sensitivity of the advancement flap and repair of his complex intersphincteric fistula.  I noted this was more  involved than initially hoped, but hopefully he will heal over time.  He feels reassured.  Return to clinic q2-3 weeks until healed.   Instructions discussed.  Followup with primary care physician for other health issues as would normally be done.  Questions answered.  The patient expressed understanding and appreciation

## 2012-12-06 NOTE — Patient Instructions (Signed)
ANORECTAL SURGERY: POST OP INSTRUCTIONS  1. Take your usually prescribed home medications unless otherwise directed. 2. DIET: Follow a light bland diet the first 24 hours after arrival home, such as soup, liquids, crackers, etc.  Be sure to include lots of fluids daily.  Avoid fast food or heavy meals as your are more likely to get nauseated.  Eat a low fat the next few days after surgery.   3. PAIN CONTROL: a. Pain is best controlled by a usual combination of three different methods TOGETHER: i. Ice/Heat ii. Over the counter pain medication iii. Prescription pain medication b. Most patients will experience some swelling and discomfort in the anus/rectal area. and incisions.  Ice packs or heat (30-60 minutes up to 6 times a day) will help. Use ice for the first few days to help decrease swelling and bruising, then switch to heat such as warm towels, sitz baths, warm baths, etc to help relax tight/sore spots and speed recovery.  Some people prefer to use ice alone, heat alone, alternating between ice & heat.  Experiment to what works for you.  Swelling and bruising can take several weeks to resolve.   c. It is helpful to take an over-the-counter pain medication regularly for the first few weeks.  Choose one of the following that works best for you: i. Naproxen (Aleve, etc)  Two 220mg tabs twice a day ii. Ibuprofen (Advil, etc) Three 200mg tabs four times a day (every meal & bedtime) iii. Acetaminophen (Tylenol, etc) 500-650mg four times a day (every meal & bedtime) d. A  prescription for pain medication (such as oxycodone, hydrocodone, etc) should be given to you upon discharge.  Take your pain medication as prescribed.  i. If you are having problems/concerns with the prescription medicine (does not control pain, nausea, vomiting, rash, itching, etc), please call us [phone] to see if we need to switch you to a different pain medicine that will work better for you and/or control your side effect  better. ii. If you need a refill on your pain medication, please contact your pharmacy.  They will contact our office to request authorization. Prescriptions will not be filled after 5 pm or on week-ends. 4. KEEP YOUR BOWELS REGULAR a. The goal is one bowel movement a day b. Avoid getting constipated.  Between the surgery and the pain medications, it is common to experience some constipation.  Increasing fluid intake and taking a fiber supplement (such as Metamucil, Citrucel, FiberCon, MiraLax, etc) 1-2 times a day regularly will usually help prevent this problem from occurring.  A mild laxative (prune juice, Milk of Magnesia, MiraLax, etc) should be taken according to package directions if there are no bowel movements after 48 hours. c. Watch out for diarrhea.  If you have many loose bowel movements, simplify your diet to bland foods & liquids for a few days.  Stop any stool softeners and decrease your fiber supplement.  Switching to mild anti-diarrheal medications (Kayopectate, Pepto Bismol) can help.  If this worsens or does not improve, please call us.  5. Wound Care a. Remove your bandages the day after surgery.  Unless discharge instructions indicate otherwise, leave your bandage dry and in place overnight.  Remove the bandage during your first bowel movement.   b. Allow the wound packing to fall out over the next few days.  You can trim exposed gauze / ribbon as it falls out.  You do not need to repack the wound unless instructed otherwise.  Wear an   absorbent pad or soft cotton gauze in your underwear as needed to catch any drainage and help keep the area  c. Keep the area clean and dry.  Bathe / shower every day.  Keep the area clean by showering / bathing over the incision / wound.   It is okay to soak an open wound to help wash it.  Wet wipes or showers / gentle washing after bowel movements is often less traumatic than regular toilet paper. d. Bonita Quin may have some styrofoam-like soft packing in  the rectum which will come out with the first bowel movement.  e. You will often notice bleeding with bowel movements.  This should slow down by the end of the first week of surgery f. Expect some drainage.  This should slow down, too, by the end of the first week of surgery.  Wear an absorbent pad or soft cotton gauze in your underwear until the drainage stops. 6. ACTIVITIES as tolerated:   a. You may resume regular (light) daily activities beginning the next day-such as daily self-care, walking, climbing stairs-gradually increasing activities as tolerated.  If you can walk 30 minutes without difficulty, it is safe to try more intense activity such as jogging, treadmill, bicycling, low-impact aerobics, swimming, etc. b. Save the most intensive and strenuous activity for last such as sit-ups, heavy lifting, contact sports, etc  Refrain from any heavy lifting or straining until you are off narcotics for pain control.   c. DO NOT PUSH THROUGH PAIN.  Let pain be your guide: If it hurts to do something, don't do it.  Pain is your body warning you to avoid that activity for another week until the pain goes down. d. You may drive when you are no longer taking prescription pain medication, you can comfortably sit for long periods of time, and you can safely maneuver your car and apply brakes. e. Bonita Quin may have sexual intercourse when it is comfortable.  7. FOLLOW UP in our office a. Please call [facility] at [phone] to set up an appointment to see your surgeon in the office for a follow-up appointment approximately 2 weeks after your surgery. b. Make sure that you call for this appointment the day you arrive home to insure a convenient appointment time. 10. IF YOU HAVE DISABILITY OR FAMILY LEAVE FORMS, BRING THEM TO THE OFFICE FOR PROCESSING.  DO NOT GIVE THEM TO YOUR DOCTOR.        WHEN TO CALL us [phone]: 1. Poor pain control 2. Reactions / problems with new medications (rash/itching, nausea,  etc)  3. Fever over 101.5 F (38.5 C) 4. Inability to urinate 5. Nausea and/or vomiting 6. Worsening swelling or bruising 7. Continued bleeding from incision. 8. Increased pain, redness, or drainage from the incision  The clinic staff is available to answer your questions during regular business hours (8:30am-5pm).  Please don't hesitate to call and ask to speak to one of our nurses for clinical concerns.   A surgeon from [facility] is always on call at the hospitals   If you have a medical emergency, go to the nearest emergency room or call 911.    [facility] [address], [address], [place], [place]  [address] ? MAIN: [phone]) [phone] ? TOLL FREE: [phone] ? FAX [phone] [email or web address]   Managing Pain  Pain after surgery or related to activity is often due to strain/injury to muscle, tendon, nerves and/or incisions.  This pain is usually short-term  and will improve in a few months.   Many people find it helpful to do the following things TOGETHER to help speed the process of healing and to get back to regular activity more quickly:  1. Avoid heavy physical activity a.  no lifting greater than 20 pounds b. Do not "push through" the pain.  Listen to your body and avoid positions and maneuvers than reproduce the pain c. Walking is okay as tolerated, but go slowly and stop when getting sore.  d. Remember: If it hurts to do it, then don't do it! 2. Take Anti-inflammatory medication  a. Take with food/snack around the clock for 1-2 weeks i. This helps the muscle and nerve tissues become less irritable and calm down faster b. Choose ONE of the following over-the-counter medications: i. Naproxen 220mg  tabs (ex. Aleve) 1-2 pills twice a day  ii. Ibuprofen 200mg  tabs (ex. Advil, Motrin) 3-4 pills with every meal and just before bedtime iii. Acetaminophen 500mg  tabs (Tylenol) 1-2 pills with every meal and just before  bedtime 3. Use a Heating pad or Ice/Cold Pack a. 4-6 times a day b. May use warm bath/hottub  or showers 4. Try Gentle Massage and/or Stretching  a. at the area of pain many times a day b. stop if you feel pain - do not overdo it  Try these steps together to help you body heal faster and avoid making things get worse.  Doing just one of these things may not be enough.    If you are not getting better after two weeks or are noticing you are getting worse, contact our office for further advice; we may need to re-evaluate you & see what other things we can do to help.

## 2012-12-25 ENCOUNTER — Encounter (INDEPENDENT_AMBULATORY_CARE_PROVIDER_SITE_OTHER): Payer: Self-pay | Admitting: Surgery

## 2012-12-25 ENCOUNTER — Ambulatory Visit (INDEPENDENT_AMBULATORY_CARE_PROVIDER_SITE_OTHER): Payer: BC Managed Care – PPO | Admitting: Surgery

## 2012-12-25 VITALS — BP 118/64 | HR 56 | Temp 97.0°F | Resp 16 | Ht 71.0 in | Wt 193.0 lb

## 2012-12-25 DIAGNOSIS — K603 Anal fistula: Secondary | ICD-10-CM

## 2012-12-25 DIAGNOSIS — K604 Rectal fistula: Secondary | ICD-10-CM

## 2012-12-25 NOTE — Patient Instructions (Signed)

## 2012-12-25 NOTE — Progress Notes (Signed)
Subjective:     Patient ID: Michael [name], male   DOB: 1966/10/18, 47 y.o.   MRN: [id number]  HPI   Michael [name]  [birth year] [id number]  Patient Care Team: [name], MD as PCP - General [name], MD as Consulting Physician (Hematology and Oncology)  This patient is a 47 y.o.male who presents today for surgical evaluation s/p LIFT procedure for perirectal intersphincteric fistula 12/01/2012.  FINAL DIAGNOSIS Diagnosis Anus, fistula, perianal - BENIGN SQUAMOUS MUCOSA WITH A FULL THICKNESS DEFECT SHOWING ACUTE AND CHRONIC INFLAMMATION AND FOCAL ABSCESS FORMATION (CLINICALLY FISTULA FORMATION). - NO DYSPLASIA, ATYPIA OR MALIGNANCY IDENTIFIED. [name] MD Pathologist, Electronic Signature (Case signed 11/27/2012) Specimen [name] and Clinical Information Specimen Comment Fistulous track Specimen(s) Obtained: Anus, fistula, perianal Specimen Clinical  The patient comes in today feeling much better.  Pain gone down.  Ibuprofen used a few times a day.  Bowel movements.  No more leaking.  No incontinence.  Bleeding much less.  More active.  Was able to go out golfing.  Some discomfort sitting in hard chair after a few hours.  Patient Active Problem List  Diagnosis  . ANEMIA  . 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 Perirectal fistula intersphincteric s/p LIFT     Past Medical History  Diagnosis Date  . 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 Leukocytosis, unspecified   . Splenomegaly     resolved CT Sep2013  . Anal fistula   . Liver cyst     Left hepatic dome 2 cm cyst, CT [id number]    Past Surgical History  Procedure Date  . Refractive surgery 2000  . Tonsilectomy, adenoidectomy, bilateral myringotomy and tubes     age 59    History   Social History  . Marital Status: Married    Spouse Name: N/A    Number of Children: N/A  . Years of Education: N/A   Occupational  History  . Not on file.   Social History Main Topics  . Smoking status: Never Smoker   . Smokeless tobacco: Never Used  . Alcohol Use: No  . Drug Use: No  . Sexually Active: Not on file   Other Topics Concern  . Not on file   Social History Narrative  . No narrative on file    Family History  Problem Relation Age of Onset  . Cancer Father 44    prostate  . Heart disease Mother     Current Outpatient Prescriptions  Medication Sig Dispense Refill  . ibuprofen (ADVIL,MOTRIN) 200 MG tablet Take 200 mg by mouth every 6 (six) hours as needed. Pt states taking 3 after every meal and 3 at bedtime      . imatinib (GLEEVEC) 400 MG tablet Take 400 mg by mouth daily. Take with meals and large glass of water.Caution:Chemotherapy.         No Known Allergies  BP 118/64  Pulse 56  Temp 97 F (36.1 C) (Temporal)  Resp 16  Ht 5\' 11"  (1.803 m)  Wt 193 lb (87.544 kg)  BMI 26.92 kg/m2  No results found.   Review of Systems  Constitutional: Negative for fever, chills and diaphoresis.  HENT: Negative for sore throat, trouble swallowing and neck pain.   Eyes: Negative for photophobia and visual disturbance.  Respiratory: Negative for choking and shortness of breath.   Cardiovascular: Negative for chest pain and palpitations.  Gastrointestinal: Positive for diarrhea, anal bleeding and  rectal pain. Negative for nausea, vomiting, abdominal pain, constipation, blood in stool and abdominal distention.  Genitourinary: Negative for dysuria, urgency, difficulty urinating and testicular pain.  Musculoskeletal: Negative for myalgias, arthralgias and gait problem.  Skin: Negative for color change and rash.  Neurological: Negative for dizziness, speech difficulty, weakness and numbness.  Hematological: Negative for adenopathy.  Psychiatric/Behavioral: Negative for hallucinations, confusion and agitation.       Objective:   Physical Exam  Constitutional: He is oriented to person, place, and  time. He appears well-developed and well-nourished. No distress.  HENT:  Head: Normocephalic.  Mouth/Throat: Oropharynx is clear and moist. No oropharyngeal exudate.  Eyes: Conjunctivae normal and EOM are normal. Pupils are equal, round, and reactive to light. No scleral icterus.  Neck: Normal range of motion. No tracheal deviation present.  Cardiovascular: Normal rate, normal heart sounds and intact distal pulses.   Pulmonary/Chest: Effort normal. No respiratory distress.  Abdominal: Soft. He exhibits no distension. There is no tenderness. Hernia confirmed negative in the right inguinal area and confirmed negative in the left inguinal area.  Genitourinary:     Musculoskeletal: Normal range of motion. He exhibits no tenderness.  Neurological: He is alert and oriented to person, place, and time. No cranial nerve deficit. He exhibits normal muscle tone. Coordination normal.  Skin: Skin is warm and dry. No rash noted. He is not diaphoretic.  Psychiatric: He has a normal mood and affect. His behavior is normal.       Assessment:     Gradually healing s/p LIFT procedure for anorectal fistula      Plan:     Increase activity as tolerated to regular activity.  Do not push through pain.  Continue warm soaks  Diet as tolerated. Bowel regimen to avoid problems. - Try imodium / pectin to help thicken & slow down stools.  The fecal urgency is probably due to the sensitivity of the advancement flap and repair of his complex intersphincteric fistula.  Gradually improving a good sign.   Return to clinic q2-3 weeks until healed.   Instructions discussed.  Followup with primary care physician for other health issues as would normally be done.  Questions answered.  The patient expressed understanding and appreciation

## 2013-01-16 HISTORY — PX: TOENAIL EXCISION: SUR558

## 2013-01-17 ENCOUNTER — Ambulatory Visit (INDEPENDENT_AMBULATORY_CARE_PROVIDER_SITE_OTHER): Payer: BC Managed Care – PPO | Admitting: Surgery

## 2013-01-17 ENCOUNTER — Encounter (INDEPENDENT_AMBULATORY_CARE_PROVIDER_SITE_OTHER): Payer: Self-pay | Admitting: Surgery

## 2013-01-17 VITALS — BP 122/86 | HR 56 | Temp 97.7°F | Resp 16 | Ht 71.0 in | Wt 193.0 lb

## 2013-01-17 DIAGNOSIS — L290 Pruritus ani: Secondary | ICD-10-CM | POA: Insufficient documentation

## 2013-01-17 DIAGNOSIS — K604 Rectal fistula: Secondary | ICD-10-CM

## 2013-01-17 DIAGNOSIS — K603 Anal fistula: Secondary | ICD-10-CM

## 2013-01-17 HISTORY — DX: Pruritus ani: L29.0

## 2013-01-17 NOTE — Patient Instructions (Signed)

## 2013-01-17 NOTE — Progress Notes (Addendum)
Subjective:     Patient ID: Michael [name], male   DOB: 10-02-1966, 47 y.o.   MRN: [id number]  HPI   Michael [name]  [birth year] [id number]  Patient Care Team: Michael Savers, MD as PCP - General Michael Dada, MD as Consulting Physician (Hematology and Oncology)  This patient is a 47 y.o.male who presents today for surgical evaluation s/p LIFT procedure for perirectal intersphincteric fistula 12/01/2012.  FINAL DIAGNOSIS Diagnosis Anus, fistula, perianal - BENIGN SQUAMOUS MUCOSA WITH A FULL THICKNESS DEFECT SHOWING ACUTE AND CHRONIC INFLAMMATION AND FOCAL ABSCESS FORMATION (CLINICALLY FISTULA FORMATION). - NO DYSPLASIA, ATYPIA OR MALIGNANCY IDENTIFIED. Michael Abts MD Pathologist, Electronic Signature (Case signed 11/27/2012) Specimen Michael [name] and Clinical Information Specimen Comment Fistulous track Specimen(s) Obtained: Anus, fistula, perianal Specimen Clinical  The patient comes in today feeling much better.  Pain gone.  Daily bowel movements.  No leaking.  No incontinence.  Bleeding rare.  More active.  Was able to go out golfing.  Has noticed areas of chafing and irritation if he is out for prolonged periods of time and gets a little sweaty.  Patient Active Problem List  Diagnosis  . ANEMIA  . 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 Perirectal fistula intersphincteric s/p LIFT     Past Medical History  Diagnosis Date  . 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 Leukocytosis, unspecified   . Splenomegaly     resolved CT Sep2013  . Anal fistula   . Liver cyst     Left hepatic dome 2 cm cyst, CT [id number]    Past Surgical History  Procedure Date  . Refractive surgery 2000  . Tonsilectomy, adenoidectomy, bilateral myringotomy and tubes     age 47  . Toenail excision 01/16/13    History   Social History  . Marital Status: Married    Spouse Name: N/A    Number of Children: N/A  .  Years of Education: N/A   Occupational History  . Not on file.   Social History Main Topics  . Smoking status: Never Smoker   . Smokeless tobacco: Never Used  . Alcohol Use: No  . Drug Use: No  . Sexually Active: Not on file   Other Topics Concern  . Not on file   Social History Narrative  . No narrative on file    Family History  Problem Relation Age of Onset  . Cancer Father 37    prostate  . Heart disease Mother     Current Outpatient Prescriptions  Medication Sig Dispense Refill  . ibuprofen (ADVIL,MOTRIN) 200 MG tablet Take 200 mg by mouth every 6 (six) hours as needed. Pt states taking 3 after every meal and 3 at bedtime      . imatinib (GLEEVEC) 400 MG tablet Take 400 mg by mouth daily. Take with meals and large glass of water.Caution:Chemotherapy.         No Known Allergies  BP 122/86  Pulse 56  Temp 97.7 F (36.5 C) (Temporal)  Resp 16  Ht 5\' 11"  (1.803 m)  Wt 193 lb (87.544 kg)  BMI 26.92 kg/m2  No results found.   Review of Systems  Constitutional: Negative for fever, chills and diaphoresis.  HENT: Negative for sore throat, trouble swallowing and neck pain.   Eyes: Negative for photophobia and visual disturbance.  Respiratory: Negative for choking and shortness of breath.   Cardiovascular: Negative for chest pain and palpitations.  Gastrointestinal:  Positive for diarrhea, anal bleeding and rectal pain. Negative for nausea, vomiting, abdominal pain, constipation, blood in stool and abdominal distention.  Genitourinary: Negative for dysuria, urgency, difficulty urinating and testicular pain.  Musculoskeletal: Negative for myalgias, arthralgias and gait problem.  Skin: Negative for color change and rash.  Neurological: Negative for dizziness, speech difficulty, weakness and numbness.  Hematological: Negative for adenopathy.  Psychiatric/Behavioral: Negative for hallucinations, confusion and agitation.       Objective:   Physical Exam    Constitutional: He is oriented to person, place, and time. He appears well-developed and well-nourished. No distress.  HENT:  Head: Normocephalic.  Mouth/Throat: Oropharynx is clear and moist. No oropharyngeal exudate.  Eyes: Conjunctivae normal and EOM are normal. Pupils are equal, round, and reactive to light. No scleral icterus.  Neck: Normal range of motion. No tracheal deviation present.  Cardiovascular: Normal rate, normal heart sounds and intact distal pulses.   Pulmonary/Chest: Effort normal. No respiratory distress.  Abdominal: Soft. He exhibits no distension. There is no tenderness. Hernia confirmed negative in the right inguinal area and confirmed negative in the left inguinal area.  Genitourinary:          Perianal skin clean with good hygiene.  No pruritis.  No external skin tags / hemorrhoids of significance.  No pilonidal disease.  No fissure.  No abscess/fistula.   Normal sphincter tone.   Musculoskeletal: Normal range of motion. He exhibits no tenderness.  Neurological: He is alert and oriented to person, place, and time. No cranial nerve deficit. He exhibits normal muscle tone. Coordination normal.  Skin: Skin is warm and dry. No rash noted. He is not diaphoretic.  Psychiatric: He has a normal mood and affect. His behavior is normal.       Assessment:     Healed s/p LIFT procedure for intersphincteric anorectal fistula  Perineal irritation suspicious for intermittent pruritus.  No evidence of yeast infection or other major abnormality    Plan:     Increase activity as tolerated to regular activity.  Do not push through pain.  Continue warm soaks  Diet as tolerated. Bowel regimen to avoid problems.   Return to clinic PRn.   Instructions discussed.  Followup with primary care physician for other health issues as would normally be done.  Questions answered.  The patient expressed understanding and appreciation

## 2013-01-19 ENCOUNTER — Encounter: Payer: Self-pay | Admitting: Oncology

## 2013-01-19 ENCOUNTER — Ambulatory Visit (HOSPITAL_BASED_OUTPATIENT_CLINIC_OR_DEPARTMENT_OTHER): Payer: BC Managed Care – PPO | Admitting: Oncology

## 2013-01-19 ENCOUNTER — Telehealth: Payer: Self-pay | Admitting: Oncology

## 2013-01-19 ENCOUNTER — Other Ambulatory Visit (HOSPITAL_BASED_OUTPATIENT_CLINIC_OR_DEPARTMENT_OTHER): Payer: BC Managed Care – PPO | Admitting: Lab

## 2013-01-19 VITALS — BP 118/74 | HR 53 | Temp 98.0°F | Resp 18 | Ht 71.0 in | Wt 192.6 lb

## 2013-01-19 DIAGNOSIS — C9210 Chronic myeloid leukemia, BCR/ABL-positive, not having achieved remission: Secondary | ICD-10-CM

## 2013-01-19 DIAGNOSIS — R161 Splenomegaly, not elsewhere classified: Secondary | ICD-10-CM

## 2013-01-19 LAB — COMPREHENSIVE METABOLIC PANEL (CC13)
Albumin: 3.9 g/dL (ref 3.5–5.0)
CO2: 24 mEq/L (ref 22–29)
Glucose: 107 mg/dl — ABNORMAL HIGH (ref 70–99)
Sodium: 141 mEq/L (ref 136–145)
Total Bilirubin: 0.67 mg/dL (ref 0.20–1.20)
Total Protein: 6.9 g/dL (ref 6.4–8.3)

## 2013-01-19 LAB — CBC WITH DIFFERENTIAL/PLATELET
Basophils Absolute: 0 10*3/uL (ref 0.0–0.1)
Eosinophils Absolute: 0 10*3/uL (ref 0.0–0.5)
HCT: 36.7 % — ABNORMAL LOW (ref 38.4–49.9)
HGB: 12.7 g/dL — ABNORMAL LOW (ref 13.0–17.1)
LYMPH%: 33.9 % (ref 14.0–49.0)
MCV: 95.7 fL (ref 79.3–98.0)
MONO%: 6.3 % (ref 0.0–14.0)
NEUT#: 1.9 10*3/uL (ref 1.5–6.5)
NEUT%: 57.9 % (ref 39.0–75.0)
Platelets: 130 10*3/uL — ABNORMAL LOW (ref 140–400)
RDW: 13.3 % (ref 11.0–14.6)

## 2013-01-19 NOTE — Telephone Encounter (Signed)
gv pt appt schedule for July.  

## 2013-01-19 NOTE — Progress Notes (Signed)
This office note has been dictated.  #[id number]

## 2013-01-20 LAB — IRON AND TIBC
Iron: 115 ug/dL (ref 42–165)
UIBC: 249 ug/dL (ref 125–400)

## 2013-01-20 LAB — FERRITIN: Ferritin: 166 ng/mL (ref 22–322)

## 2013-01-20 NOTE — Progress Notes (Signed)
CC:   [name], MD [name], MD  PROBLEM LIST: 1. 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  The     patient has been on Gleevec since 02/24/2008.  Quantitative RT-PCR     for BCR/ABL1 showed no detectable transcripts.  This represents a 4-     log or greater improvement. 2. Elevated triglyceride levels. 3. Recurrent anal fistula, status post LIFT procedure for perirectal     intersphincteric fistula carried out on 11/23/2012.  MEDICATIONS:  Gleevec 400 mg daily, which the patient takes after his supper.  Gleevec was started in early March 2009.  IMMUNIZATIONS:  The patient has not had Pneumovax or a flu shot.  SMOKING HISTORY:  Patient has never smoked cigarettes.  HISTORY:  I saw Michael [name] today for followup of his chronic granulocytic leukemia with diagnosis going back to February 2009.  Antoinette was last seen by [place] on 08/14/2012 and prior to that on 03/10/2012.  From the standpoint of his CLM, he has done extremely well.  His most recent mutational analysis showed no detectable transcripts, representing at least a 4-log improvement.  The patient denies any side effects related to his treatment nor does he have any symptoms or problems related to his underlying CML.  Jamori did have a problem with recurrent anal fistula.  He underwent 1-2 incision and drainage procedures, which were unsuccessful.  Finally, he underwent a surgical procedure by Dr. Michaell Cowing on 11/23/2012.  At the moment, his surgery has healed well and the problem seems to be resolved.  As stated, Ricco is without complaints today.  He works full- time.  PHYSICAL EXAMINATION:  General:  He looks well.  Weight is 192.6 pounds. Height 5 feet 11 inches.  Body surface area 2.09 m square.  Blood pressure 118/74.  Other vital signs are normal.  There is no scleral icterus.  Mouth and pharynx are benign.  There is no peripheral adenopathy palpable.  Heart/Lungs:  Normal.  Abdomen:   Benign with no organomegaly or masses palpable.  No splenomegaly was present. Extremities:  No peripheral edema or clubbing.  No petechiae or purpura. Neurologic:  Normal.  Rectal:  The perianal area was not examined.  The patient had seen Dr. Karie Soda a couple of days ago for a postoperative check.  LABORATORY DATA:  Today, white count 3.2, ANC 1.9, hemoglobin 12.7, hematocrit 36.7, platelets 130,000.  Chemistries and another quantitative analysis by PCR for BCR/ABL is pending.  As stated, the last 1 carried out on 08/14/2012 showed no detectable transcripts, representing at least a 4-log improvement.  The patient qualifies for a major molecular response.  Chemistries on 08/14/2012 were normal except for a glucose of 138 and a minimal elevation of LDH at 225.  Albumin was 4.2.  IMAGING STUDIES: 1. Abdominal CT with IV contrast carried out on 02/12/2008 showed     marked splenomegaly with no retroperitoneal adenopathy. 2. Pelvic CT scan with IV contrast was negative. 3. CT scan of the abdomen and pelvis with IV contrast on 08/22/2012     showed resolved splenomegaly and no adenopathy.  There was a 2 cm     cyst in the left hepatic dome.  There was no acute intra-abdominal     or pelvic process.  Appendix was normal.  IMPRESSION/PLAN:  Kleber continues to do very well from the standpoint of his underlying chronic granulocytic leukemia.  As stated, there are no detectable transcripts.  He will continue on Gleevec 400 mg  daily.  We will plan to see him again in 6 months, at which time we will check CBC, chemistries, and also another analysis for BCR/ABL.    ______________________________ [name], M.D. DSM/MEDQ  D:  01/19/2013  T:  01/20/2013  Job:  [id number]

## 2013-01-25 ENCOUNTER — Encounter: Payer: Self-pay | Admitting: Oncology

## 2013-01-25 NOTE — Progress Notes (Signed)
BCR/ABL1 from 01/19/2013 came back entirely negative (undetectable).  The lower limit of detection is 0.07%. Undetected transcripts represent a  4 log or greater improvement.

## 2013-02-15 ENCOUNTER — Telehealth: Payer: Self-pay

## 2013-02-15 NOTE — Telephone Encounter (Signed)
Received message that pt is getting low on gleevec and his drug company is saying he needs prior authorization for 90 day supply. Message forwarded to [facility].

## 2013-02-19 ENCOUNTER — Other Ambulatory Visit: Payer: Self-pay | Admitting: Medical Oncology

## 2013-02-22 ENCOUNTER — Other Ambulatory Visit: Payer: Self-pay | Admitting: Medical Oncology

## 2013-02-22 ENCOUNTER — Encounter: Payer: Self-pay | Admitting: Oncology

## 2013-02-22 MED ORDER — IMATINIB MESYLATE 400 MG PO TABS: 400.0000 mg | ORAL_TABLET | Freq: Every day | ORAL | Status: DC

## 2013-03-16 ENCOUNTER — Telehealth: Payer: Self-pay | Admitting: Medical Oncology

## 2013-03-16 NOTE — Telephone Encounter (Signed)
I called pt to let him know that we received his Gleevec.He will come by this afternoon and pick it up.

## 2013-06-15 NOTE — Progress Notes (Unsigned)
Tried to get PA for his Gleevec 400 mg  Mg for a 90 Day Supply.  It was approved and sent to

## 2013-07-18 ENCOUNTER — Telehealth: Payer: Self-pay | Admitting: Hematology and Oncology

## 2013-07-18 NOTE — Telephone Encounter (Signed)
Returned pt's call and r/s 7/31 appt to 8/7 per pt request. Pt has new d/t.

## 2013-07-19 ENCOUNTER — Ambulatory Visit: Payer: BC Managed Care – PPO

## 2013-07-19 ENCOUNTER — Other Ambulatory Visit: Payer: BC Managed Care – PPO | Admitting: Lab

## 2013-07-26 ENCOUNTER — Other Ambulatory Visit (HOSPITAL_BASED_OUTPATIENT_CLINIC_OR_DEPARTMENT_OTHER): Payer: BC Managed Care – PPO | Admitting: Lab

## 2013-07-26 ENCOUNTER — Telehealth: Payer: Self-pay | Admitting: Hematology and Oncology

## 2013-07-26 ENCOUNTER — Ambulatory Visit (HOSPITAL_BASED_OUTPATIENT_CLINIC_OR_DEPARTMENT_OTHER): Payer: BC Managed Care – PPO | Admitting: Hematology and Oncology

## 2013-07-26 VITALS — BP 134/83 | HR 55 | Temp 98.0°F | Resp 18 | Ht 71.0 in | Wt 187.2 lb

## 2013-07-26 DIAGNOSIS — C9210 Chronic myeloid leukemia, BCR/ABL-positive, not having achieved remission: Secondary | ICD-10-CM

## 2013-07-26 DIAGNOSIS — D649 Anemia, unspecified: Secondary | ICD-10-CM

## 2013-07-26 LAB — CBC WITH DIFFERENTIAL/PLATELET
EOS%: 1.1 % (ref 0.0–7.0)
Eosinophils Absolute: 0 10*3/uL (ref 0.0–0.5)
MCH: 33.3 pg (ref 27.2–33.4)
MCV: 95.8 fL (ref 79.3–98.0)
MONO%: 6.6 % (ref 0.0–14.0)
NEUT#: 2 10*3/uL (ref 1.5–6.5)
RBC: 3.76 10*6/uL — ABNORMAL LOW (ref 4.20–5.82)
RDW: 12.8 % (ref 11.0–14.6)
lymph#: 1.3 10*3/uL (ref 0.9–3.3)

## 2013-07-26 LAB — COMPREHENSIVE METABOLIC PANEL (CC13)
AST: 30 U/L (ref 5–34)
Alkaline Phosphatase: 79 U/L (ref 40–150)
BUN: 15.1 mg/dL (ref 7.0–26.0)
Calcium: 9.1 mg/dL (ref 8.4–10.4)
Creatinine: 1.1 mg/dL (ref 0.7–1.3)
Total Bilirubin: 0.43 mg/dL (ref 0.20–1.20)

## 2013-07-26 NOTE — Telephone Encounter (Signed)
gv and printed appt sched and avs for pt  °

## 2013-07-26 NOTE — Progress Notes (Signed)
CC:   Michael Savers, MD Michael Sportsman, MD  PROBLEM LIST: 1. 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  The     patient has been on Gleevec since 02/24/2008.  Quantitative RT-PCR     for BCR/ABL1 showed no detectable transcripts.  This represents a 4-     log or greater improvement. 2. Elevated triglyceride levels. 3. Recurrent anal fistula, status post LIFT procedure for perirectal     intersphincteric fistula carried out on 11/23/2012.  MEDICATIONS:  Gleevec 400 mg daily, which the patient takes after his supper.  Gleevec was started in early March 2009.  IMMUNIZATIONS:  The patient has not had Pneumovax or a flu shot.  SMOKING HISTORY:  Patient has never smoked cigarettes.  HISTORY:  I saw Michael [name] today for followup of his chronic granulocytic leukemia with diagnosis going back to February 2009.   He is doing well and has no complaints.  From the standpoint of his CLM, he has done extremely well.  His most recent mutational analysis showed no detectable transcripts, representing at least a 4-log improvement.  The patient denies any side effects related to his treatment nor does he have any symptoms or problems related to his underlying CML.   Blood pressure 134/83, pulse 55, temperature 98 F (36.7 C), temperature source Oral, resp. rate 18, height 5\' 11"  (1.803 m), weight 187 lb 3.2 oz (84.913 kg). PHYSICAL EXAMINATION:  General:  He looks well.  Weight is 192.6 pounds. Height 5 feet 11 inches.  Body surface area 2.09 m square.  Blood pressure 118/74.  Other vital signs are normal.  There is no scleral icterus.  Mouth and pharynx are benign.  There is no peripheral adenopathy palpable.  Heart/Lungs:  Normal.  Abdomen:  Benign with no organomegaly or masses palpable.  No splenomegaly was present. Extremities:  No peripheral edema or clubbing.  No petechiae or purpura. Neurologic:  Normal.  Rectal:  The perianal area was not examined.   The patient had seen Dr. Karie [name] a couple of days ago for a postoperative check.  LABORATORY DATA:   CBC    Component Value Date/Time   WBC 3.6* 07/26/2013 1504   WBC 152.4 REPEATED TO VERIFY CRITICAL RESULT CALLED TO, READ BACK BY AND VERIFIED WITH: J JILTON @1225  BY S VOISARD* 02/19/2008 1000   RBC 3.76* 07/26/2013 1504   RBC 4.09 CORRECTED FOR ELEVATED WBC* 02/19/2008 1000   HGB 12.5* 07/26/2013 1504   HGB RESULTS UNAVAILABLE DUE TO INTERFERING SUBSTANCE 02/19/2008 1000   HCT 36.1* 07/26/2013 1504   HCT 39.0 SPUN HCT 02/19/2008 1000   PLT 139* 07/26/2013 1504   PLT 232 02/19/2008 1000   MCV 95.8 07/26/2013 1504   MCV 95.4 CORRECTED FOR ELEVATED WBC 02/19/2008 1000   MCH 33.3 07/26/2013 1504   MCH 34.3* 12/31/2010 1438   MCHC 34.7 07/26/2013 1504   MCHC RESULTS UNAVAILABLE DUE TO INTERFERING SUBSTANCE 02/19/2008 1000   RDW 12.8 07/26/2013 1504   RDW RESULTS UNAVAILABLE DUE TO INTERFERING SUBSTANCE 02/19/2008 1000   LYMPHSABS 1.3 07/26/2013 1504   MONOABS 0.2 07/26/2013 1504   EOSABS 0.0 07/26/2013 1504   BASOSABS 0.0 07/26/2013 1504    Lab Results  Component Value Date   GLUCOSE 95 07/26/2013   BUN 15.1 07/26/2013   CO2 25 07/26/2013   ALT 25 07/26/2013   AST 30 07/26/2013   LDH 211 07/26/2013   K 4.0 07/26/2013   CREATININE 1.1 07/26/2013     IMAGING  STUDIES: 1. Abdominal CT with IV contrast carried out on 02/12/2008 showed     marked splenomegaly with no retroperitoneal adenopathy. 2. Pelvic CT scan with IV contrast was negative. 3. CT scan of the abdomen and pelvis with IV contrast on 08/22/2012     showed resolved splenomegaly and no adenopathy.  There was a 2 cm     cyst in the left hepatic dome.  There was no acute intra-abdominal     or pelvic process.  Appendix was normal.  IMPRESSION/PLAN:  Michael [name] continues to do very well from the standpoint of his underlying chronic granulocytic leukemia.  As stated, there are no detectable transcripts, today's result still pending.  He will continue on Gleevec 400 mg  daily.  We will plan to see him again in 3 months, at which time we will check CBC, chemistries, and also another analysis for BCR/ABL.   Zachery Dakins, MD 07/26/2013 3:33 PM

## 2013-09-25 ENCOUNTER — Telehealth: Payer: Self-pay | Admitting: Internal Medicine

## 2013-09-25 NOTE — Telephone Encounter (Signed)
Pt called today to r/s November lb/fu to February 2015. Per pt when he was last seen by the new guy (Dr. Karel Jarvis) it was up in the air about whether he would come back in 3mos or 6mos and due to some other billing issues he feels it best if he comes less ofter. appt moved to 01/31/14. Message left for desk nurse.

## 2013-10-17 ENCOUNTER — Telehealth: Payer: Self-pay | Admitting: Medical Oncology

## 2013-10-17 NOTE — Telephone Encounter (Signed)
Received a call from [facility] asking for diagnosis and Dr. Benjiman Core NPI. Pt was approved from September 29,2014 to October 29,2015. Approval # [id number].

## 2013-10-20 DIAGNOSIS — M5020 Other cervical disc displacement, unspecified cervical region: Secondary | ICD-10-CM

## 2013-10-20 HISTORY — DX: Other cervical disc displacement, unspecified cervical region: M50.20

## 2013-10-25 ENCOUNTER — Telehealth: Payer: Self-pay | Admitting: Internal Medicine

## 2013-10-25 ENCOUNTER — Ambulatory Visit (INDEPENDENT_AMBULATORY_CARE_PROVIDER_SITE_OTHER): Payer: BC Managed Care – PPO | Admitting: Family Medicine

## 2013-10-25 ENCOUNTER — Encounter: Payer: Self-pay | Admitting: Family Medicine

## 2013-10-25 VITALS — BP 138/97 | HR 74 | Temp 98.0°F | Resp 18 | Ht 71.0 in | Wt 185.0 lb

## 2013-10-25 DIAGNOSIS — M5416 Radiculopathy, lumbar region: Secondary | ICD-10-CM

## 2013-10-25 DIAGNOSIS — IMO0002 Reserved for concepts with insufficient information to code with codable children: Secondary | ICD-10-CM

## 2013-10-25 MED ORDER — HYDROCODONE-ACETAMINOPHEN 5-325 MG PO TABS: ORAL_TABLET | ORAL | Status: DC

## 2013-10-25 MED ORDER — PREDNISONE 20 MG PO TABS: 20.0000 mg | ORAL_TABLET | Freq: Two times a day (BID) | ORAL | Status: DC

## 2013-10-25 NOTE — Patient Instructions (Signed)
When you finish your prednisone, start ibuprofen 600mg  twice a day with food for 10 days.

## 2013-10-25 NOTE — Telephone Encounter (Signed)
Pt has appt

## 2013-10-25 NOTE — Telephone Encounter (Signed)
Patient Information:  Caller Name: Porter  Phone: [phone]  Patient: Michael [name]  Gender: Male  DOB: [birth year]  Age: 47 Years  PCP: Eleonore Chiquito (Family Practice > 74yrs old)  Office Follow Up:  Does the office need to follow up with this patient?: No  Instructions For The Office: N/A  RN Note:  Tim Lair scheduled an appointment with Dr. Milinda Cave at 15:00 at the [facility] office.  Symptoms  Reason For Call & Symptoms: Onset 10/21/2013 whlle working out, pt felt twinge in left lower back and then pain.  Only relief is with lying down.  Describes now as stiffness and heavy pressure in left upper leg.  Reviewed Health History In EMR: Yes  Reviewed Medications In EMR: Yes  Reviewed Allergies In EMR: Yes  Reviewed Surgeries / Procedures: Yes  Date of Onset of Symptoms: 10/21/2013  Treatments Tried: Ibuprofen 600 mg 3 times day, LD at 12:30.  Also taking Momentum (for back refief).  Treatments Tried Worked: No  Guideline(s) Used:  Back Injury  Back Pain  Disposition Per Guideline:   See Today in Office  Reason For Disposition Reached:   Can't walk or can barely walk  Advice Given:  N/A  Patient Will Follow Care Advice:  YES

## 2013-10-25 NOTE — Progress Notes (Signed)
OFFICE NOTE  10/31/2013  CC:  Chief Complaint  Patient presents with  . Back Pain     HPI: Patient is a 47 y.o. Caucasian male who is here for left lower back pain. Onset lifting weights 5d/a, pain going down left leg--lateral portion mostly (low back pain improved a little, but pain in glut, hip, thigh are worse--doesn't go past knee.  Gets better lying on back with legs flexed.  Walking and lying with legs outstretched and sitting all make it worse. Hx of low back strain about 20 yrs ago, without radiculopathy sx's. No distinct weakness in leg.  No saddle anesthesia, no loss of bowel or bladder control. Ibuprofen 600mg  tid lately.  Also, "momentum" from his wife--some type of back ache relief pill. Ice the first day, then changed over to heat.  ROS: no abnormal wt loss, no unexplained fevers, no malaise or anorexia  Pertinent PMH: Dx'd with CML 2009 Past Medical History  Diagnosis Date  . 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 Leukocytosis, unspecified   . Splenomegaly     resolved CT Sep2013  . Anal fistula   . Liver cyst     Left hepatic dome 2 cm cyst, CT Sep2013  . Pruritus ani, intermittent 01/17/2013    MEDS:  Outpatient Prescriptions Prior to Visit  Medication Sig Dispense Refill  . imatinib (GLEEVEC) 400 MG tablet Take 1 tablet (400 mg total) by mouth daily. Take with meals and large glass of water.Caution:Chemotherapy.  90 tablet  3   No facility-administered medications prior to visit.    PE: Blood pressure 138/97, pulse 74, temperature 98 F (36.7 C), temperature source Temporal, resp. rate 18, height 5\' 11"  (1.803 m), weight 185 lb (83.915 kg), SpO2 100.00%. Gen: Alert, well appearing.  Patient is oriented to person, place, time, and situation. L-spine ROM intact.  No L/S spine or paraspinous tenderness. LE strength 5/5 bilat. Sitting SLR: right side elicited left lower back pain but no radicular sx's.  Left side  elicited left lower back pain with extension down left glut, hamstring. Patellar reflex 2+ on right, absent on left.    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 Referral to [facility] PT ASAP. Prednisone 40mg  qd x 5d, then ibuprofen 600 mg bid x 10d. Vicodin 5/325, 1-2 q6h prn pain. Signs/symptoms to call or return for were reviewed and pt expressed understanding.    An After Visit Summary was printed and given to the patient.  FOLLOW UP: prn

## 2013-10-31 DIAGNOSIS — M5416 Radiculopathy, lumbar region: Secondary | ICD-10-CM | POA: Insufficient documentation

## 2013-10-31 NOTE — Assessment & Plan Note (Signed)
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 Referral to [facility] PT ASAP. Prednisone 40mg  qd x 5d, then ibuprofen 600 mg bid x 10d. Vicodin 5/325, 1-2 q6h prn pain. Signs/symptoms to call or return for were reviewed and pt expressed understanding.

## 2013-11-01 ENCOUNTER — Other Ambulatory Visit: Payer: BC Managed Care – PPO | Admitting: Lab

## 2013-11-01 ENCOUNTER — Ambulatory Visit: Payer: BC Managed Care – PPO

## 2013-11-27 ENCOUNTER — Ambulatory Visit (INDEPENDENT_AMBULATORY_CARE_PROVIDER_SITE_OTHER): Payer: BC Managed Care – PPO | Admitting: Internal Medicine

## 2013-11-27 ENCOUNTER — Encounter: Payer: Self-pay | Admitting: Internal Medicine

## 2013-11-27 VITALS — BP 138/90 | HR 87 | Temp 98.2°F | Resp 20 | Wt 183.0 lb

## 2013-11-27 DIAGNOSIS — IMO0002 Reserved for concepts with insufficient information to code with codable children: Secondary | ICD-10-CM

## 2013-11-27 DIAGNOSIS — M5416 Radiculopathy, lumbar region: Secondary | ICD-10-CM

## 2013-11-27 NOTE — Patient Instructions (Signed)
Back Injury Prevention Back injuries can be extremely painful and difficult to heal. After having one back injury, you are much more likely to experience another later on. It is important to learn how to avoid injuring or re-injuring your back. The following tips can help you to prevent a back injury. PHYSICAL FITNESS  Exercise regularly and try to develop good tone in your abdominal muscles. Your abdominal muscles provide a lot of the support needed by your back.  Do aerobic exercises (walking, jogging, biking, swimming) regularly.  Do exercises that increase balance and strength (tai chi, yoga) regularly. This can decrease your risk of falling and injuring your back.  Stretch before and after exercising.  Maintain a healthy weight. The more you weigh, the more stress is placed on your back. For every pound of weight, 10 times that amount of pressure is placed on the back. DIET  Talk to your caregiver about how much calcium and vitamin D you need per day. These nutrients help to prevent weakening of the bones (osteoporosis). Osteoporosis can cause broken (fractured) bones that lead to back pain.  Include good sources of calcium in your diet, such as dairy products, green, leafy vegetables, and products with calcium added (fortified).  Include good sources of vitamin D in your diet, such as milk and foods that are fortified with vitamin D.  Consider taking a nutritional supplement or a multivitamin if needed.  Stop smoking if you smoke. POSTURE  Sit and stand up straight. Avoid leaning forward when you sit or hunching over when you stand.  Choose chairs with good low back (lumbar) support.  If you work at a desk, sit close to your work so you do not need to lean over. Keep your chin tucked in. Keep your neck drawn back and elbows bent at a right angle. Your arms should look like the letter "L."  Sit high and close to the steering wheel when you drive. Add a lumbar support to your car  seat if needed.  Avoid sitting or standing in one position for too long. Take breaks to get up, stretch, and walk around at least once every hour. Take breaks if you are driving for long periods of time.  Sleep on your side with your knees slightly bent, or sleep on your back with a pillow under your knees. Do not sleep on your stomach. LIFTING, TWISTING, AND REACHING  Avoid heavy lifting, especially repetitive lifting. If you must do heavy lifting:  Stretch before lifting.  Work slowly.  Rest between lifts.  Use carts and dollies to move objects when possible.  Make several small trips instead of carrying 1 heavy load.  Ask for help when you need it.  Ask for help when moving big, awkward objects.  Follow these steps when lifting:  Stand with your feet shoulder-width apart.  Get as close to the object as you can. Do not try to pick up heavy objects that are far from your body.  Use handles or lifting straps if they are available.  Bend at your knees. Squat down, but keep your heels off the floor.  Keep your shoulders pulled back, your chin tucked in, and your back straight.  Lift the object slowly, tightening the muscles in your legs, abdomen, and buttocks. Keep the object as close to the center of your body as possible.  When you put a load down, use these same guidelines in reverse.  Do not:  Lift the object above your waist.    Twist at the waist while lifting or carrying a load. Move your feet if you need to turn, not your waist.  Bend over without bending at your knees.  Avoid reaching over your head, across a table, or for an object on a high surface. OTHER TIPS  Avoid wet floors and keep sidewalks clear of ice to prevent falls.  Do not sleep on a mattress that is too soft or too hard.  Keep items that are used frequently within easy reach.  Put heavier objects on shelves at waist level and lighter objects on lower or higher shelves.  Find ways to  decrease your stress, such as exercise, massage, or relaxation techniques. Stress can build up in your muscles. Tense muscles are more vulnerable to injury.  Seek treatment for depression or anxiety if needed. These conditions can increase your risk of developing back pain. SEEK MEDICAL CARE IF:  You injure your back.  You have questions about diet, exercise, or other ways to prevent back injuries. MAKE SURE YOU:  Understand these instructions.  Will watch your condition.  Will get help right away if you are not doing well or get worse. Document Released: 01/13/2005 Document Revised: 02/28/2012 Document Reviewed: 01/17/2012 [facility] Patient Information 2014 [place], [place]. Sciatica Sciatica is pain, weakness, numbness, or tingling along the path of the sciatic nerve. The nerve starts in the lower back and runs down the back of each leg. The nerve controls the muscles in the lower leg and in the back of the knee, while also providing sensation to the back of the thigh, lower leg, and the sole of your foot. Sciatica is a symptom of another medical condition. For instance, nerve damage or certain conditions, such as a herniated disk or bone spur on the spine, pinch or put pressure on the sciatic nerve. This causes the pain, weakness, or other sensations normally associated with sciatica. Generally, sciatica only affects one side of the body. CAUSES   Herniated or slipped disc.  Degenerative disk disease.  A pain disorder involving the narrow muscle in the buttocks (piriformis syndrome).  Pelvic injury or fracture.  Pregnancy.  Tumor (rare). SYMPTOMS  Symptoms can vary from mild to very severe. The symptoms usually travel from the low back to the buttocks and down the back of the leg. Symptoms can include:  Mild tingling or dull aches in the lower back, leg, or hip.  Numbness in the back of the calf or sole of the foot.  Burning sensations in the lower back, leg, or hip.  Sharp  pains in the lower back, leg, or hip.  Leg weakness.  Severe back pain inhibiting movement. These symptoms may get worse with coughing, sneezing, laughing, or prolonged sitting or standing. Also, being overweight may worsen symptoms. DIAGNOSIS  Your caregiver will perform a physical exam to look for common symptoms of sciatica. He or she may ask you to do certain movements or activities that would trigger sciatic nerve pain. Other tests may be performed to find the cause of the sciatica. These may include:  Blood tests.  X-rays.  Imaging tests, such as an MRI or CT scan. TREATMENT  Treatment is directed at the cause of the sciatic pain. Sometimes, treatment is not necessary and the pain and discomfort goes away on its own. If treatment is needed, your caregiver may suggest:  Over-the-counter medicines to relieve pain.  Prescription medicines, such as anti-inflammatory medicine, muscle relaxants, or narcotics.  Applying heat or ice to the painful area.  Steroid injections to lessen pain, irritation, and inflammation around the nerve.  Reducing activity during periods of pain.  Exercising and stretching to strengthen your abdomen and improve flexibility of your spine. Your caregiver may suggest losing weight if the extra weight makes the back pain worse.  Physical therapy.  Surgery to eliminate what is pressing or pinching the nerve, such as a bone spur or part of a herniated disk. HOME CARE INSTRUCTIONS   Only take over-the-counter or prescription medicines for pain or discomfort as directed by your caregiver.  Apply ice to the affected area for 20 minutes, 3 4 times a day for the first 48 72 hours. Then try heat in the same way.  Exercise, stretch, or perform your usual activities if these do not aggravate your pain.  Attend physical therapy sessions as directed by your caregiver.  Keep all follow-up appointments as directed by your caregiver.  Do not wear high heels or  shoes that do not provide proper support.  Check your mattress to see if it is too soft. A firm mattress may lessen your pain and discomfort. SEEK IMMEDIATE MEDICAL CARE IF:   You lose control of your bowel or bladder (incontinence).  You have increasing weakness in the lower back, pelvis, buttocks, or legs.  You have redness or swelling of your back.  You have a burning sensation when you urinate.  You have pain that gets worse when you lie down or awakens you at night.  Your pain is worse than you have experienced in the past.  Your pain is lasting longer than 4 weeks.  You are suddenly losing weight without reason. MAKE SURE YOU:  Understand these instructions.  Will watch your condition.  Will get help right away if you are not doing well or get worse. Document Released: 11/30/2001 Document Revised: 06/06/2012 Document Reviewed: 04/16/2012 [facility] Patient Information 2014 [place], [place].

## 2013-11-27 NOTE — Progress Notes (Signed)
Subjective:    Patient ID: Michael [name], male    DOB: 09/15/1966, 46 y.o.   MRN: [id number]  HPI  47 year old patient who presents for followup of low back pain this started on November 7. He was working out at [employer] and doing some heavy lifting while bent at the waist. He noted acute severe pain in the lumbar area. He was quite uncomfortable for at least 2 weeks with pain down the left leg. He has been involved with physical therapy and is steadily improving. He has no further back pain but continues to have some dysesthesias involving the left leg especially the medial aspect of the left leg and ankle. He states that he had some left leg weakness earlier but this has resolved. He continues to benefit from physical therapy  Past Medical History  Diagnosis Date  . 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 Leukocytosis, unspecified   . Splenomegaly     resolved CT Sep2013  . Anal fistula   . Liver cyst     Left hepatic dome 2 cm cyst, CT Sep2013  . Pruritus ani, intermittent 01/17/2013    History   Social History  . Marital Status: Married    Spouse Name: N/A    Number of Children: N/A  . Years of Education: N/A   Occupational History  . Not on file.   Social History Main Topics  . Smoking status: Never Smoker   . Smokeless tobacco: Never Used  . Alcohol Use: No  . Drug Use: No  . Sexual Activity: Not on file   Other Topics Concern  . Not on file   Social History Narrative  . No narrative on file    Past Surgical History  Procedure Laterality Date  . Refractive surgery  2000  . Tonsilectomy, adenoidectomy, bilateral myringotomy and tubes      age 49  . Toenail excision  01/16/13    Family History  Problem Relation Age of Onset  . Cancer Father 76    prostate  . Heart disease Mother     No Known Allergies  Current Outpatient Prescriptions on File Prior to Visit  Medication Sig Dispense Refill  . ibuprofen  (ADVIL,MOTRIN) 600 MG tablet Take 600 mg by mouth every 8 (eight) hours as needed.      . imatinib (GLEEVEC) 400 MG tablet Take 1 tablet (400 mg total) by mouth daily. Take with meals and large glass of water.Caution:Chemotherapy.  90 tablet  3  . HYDROcodone-acetaminophen (NORCO/VICODIN) 5-325 MG per tablet 1-2 every six hours as needed for pain.  40 tablet  0   No current facility-administered medications on file prior to visit.    BP 138/90  Pulse 87  Temp(Src) 98.2 F (36.8 C) (Oral)  Resp 20  Wt 183 lb (83.008 kg)  SpO2 99%       Review of Systems  Constitutional: Negative for fever, chills, appetite change and fatigue.  HENT: Negative for congestion, dental problem, ear pain, hearing loss, sore throat, tinnitus, trouble swallowing and voice change.   Eyes: Negative for pain, discharge and visual disturbance.  Respiratory: Negative for cough, chest tightness, wheezing and stridor.   Cardiovascular: Negative for chest pain, palpitations and leg swelling.  Gastrointestinal: Negative for nausea, vomiting, abdominal pain, diarrhea, constipation, blood in stool and abdominal distention.  Genitourinary: Negative for urgency, hematuria, flank pain, discharge, difficulty urinating and genital sores.  Musculoskeletal: Positive for back pain. Negative for arthralgias,  gait problem, joint swelling, myalgias and neck stiffness.  Skin: Negative for rash.  Neurological: Positive for weakness and numbness. Negative for dizziness, syncope, speech difficulty and headaches.  Hematological: Negative for adenopathy. Does not bruise/bleed easily.  Psychiatric/Behavioral: Negative for behavioral problems and dysphoric mood. The patient is not nervous/anxious.        Objective:   Physical Exam  Constitutional: He appears well-developed and well-nourished. No distress.  Musculoskeletal:  Negative straight leg test  Markedly diminished left patellar reflex Achilles reflexes normal Able to  walk on toes and heels without difficulty   numbness left medial lower leg and ankle        Assessment & Plan:   Improving left L4 radiculopathy.   will continue active physical therapy Consider lumbar MRI if any clinical worsening

## 2013-11-27 NOTE — Progress Notes (Signed)
Pre-visit discussion using our clinic review tool. No additional management support is needed unless otherwise documented below in the visit note.  

## 2013-12-20 HISTORY — PX: TREATMENT FISTULA ANAL: SUR1390

## 2014-01-30 ENCOUNTER — Other Ambulatory Visit: Payer: Self-pay

## 2014-01-30 DIAGNOSIS — C9210 Chronic myeloid leukemia, BCR/ABL-positive, not having achieved remission: Secondary | ICD-10-CM

## 2014-01-31 ENCOUNTER — Telehealth: Payer: Self-pay | Admitting: Internal Medicine

## 2014-01-31 ENCOUNTER — Other Ambulatory Visit (HOSPITAL_BASED_OUTPATIENT_CLINIC_OR_DEPARTMENT_OTHER): Payer: BC Managed Care – PPO

## 2014-01-31 ENCOUNTER — Ambulatory Visit (HOSPITAL_BASED_OUTPATIENT_CLINIC_OR_DEPARTMENT_OTHER): Payer: BC Managed Care – PPO | Admitting: Internal Medicine

## 2014-01-31 VITALS — BP 135/79 | HR 66 | Temp 97.8°F | Resp 18 | Ht 71.0 in | Wt 184.7 lb

## 2014-01-31 DIAGNOSIS — IMO0002 Reserved for concepts with insufficient information to code with codable children: Secondary | ICD-10-CM

## 2014-01-31 DIAGNOSIS — C9210 Chronic myeloid leukemia, BCR/ABL-positive, not having achieved remission: Secondary | ICD-10-CM

## 2014-01-31 DIAGNOSIS — M5416 Radiculopathy, lumbar region: Secondary | ICD-10-CM

## 2014-01-31 LAB — COMPREHENSIVE METABOLIC PANEL (CC13)
ALBUMIN: 4.4 g/dL (ref 3.5–5.0)
ALT: 32 U/L (ref 0–55)
AST: 31 U/L (ref 5–34)
Alkaline Phosphatase: 89 U/L (ref 40–150)
Anion Gap: 9 mEq/L (ref 3–11)
BUN: 13.9 mg/dL (ref 7.0–26.0)
CALCIUM: 9.4 mg/dL (ref 8.4–10.4)
CHLORIDE: 107 meq/L (ref 98–109)
CO2: 25 meq/L (ref 22–29)
Creatinine: 1.2 mg/dL (ref 0.7–1.3)
Glucose: 95 mg/dl (ref 70–140)
Potassium: 4.2 mEq/L (ref 3.5–5.1)
Sodium: 142 mEq/L (ref 136–145)
Total Bilirubin: 0.46 mg/dL (ref 0.20–1.20)
Total Protein: 7.2 g/dL (ref 6.4–8.3)

## 2014-01-31 LAB — CBC WITH DIFFERENTIAL/PLATELET
BASO%: 0.5 % (ref 0.0–2.0)
BASOS ABS: 0 10*3/uL (ref 0.0–0.1)
EOS%: 1.4 % (ref 0.0–7.0)
Eosinophils Absolute: 0.1 10*3/uL (ref 0.0–0.5)
HEMATOCRIT: 37.1 % — AB (ref 38.4–49.9)
HGB: 12.8 g/dL — ABNORMAL LOW (ref 13.0–17.1)
LYMPH#: 1.5 10*3/uL (ref 0.9–3.3)
LYMPH%: 36.5 % (ref 14.0–49.0)
MCH: 32.7 pg (ref 27.2–33.4)
MCHC: 34.5 g/dL (ref 32.0–36.0)
MCV: 94.9 fL (ref 79.3–98.0)
MONO#: 0.3 10*3/uL (ref 0.1–0.9)
MONO%: 7.2 % (ref 0.0–14.0)
NEUT#: 2.3 10*3/uL (ref 1.5–6.5)
NEUT%: 54.4 % (ref 39.0–75.0)
Platelets: 132 10*3/uL — ABNORMAL LOW (ref 140–400)
RBC: 3.91 10*6/uL — ABNORMAL LOW (ref 4.20–5.82)
RDW: 13.7 % (ref 11.0–14.6)
WBC: 4.2 10*3/uL (ref 4.0–10.3)

## 2014-01-31 LAB — LACTATE DEHYDROGENASE (CC13): LDH: 216 U/L (ref 125–245)

## 2014-01-31 NOTE — Patient Instructions (Signed)
Chronic Myelogenous Leukemia Chronic myelogenous leukemia (CML) is a rare form of cancer of the blood cells. It is called chronic because it develops more slowly than other forms of leukemia, which are considered acute. No one knows the exact cause of CML. RISK FACTORS  Having a chromosome abnormality called [place] chromosome. Chromosomes contain your genes, which determine your physical traits, such as eye color or hair color.   Having had radiation treatment for some other condition or form of cancer.   Being an adult. CML usually occurs later in life and is rare in children. SYMPTOMS  At first, there may be no symptoms. After a while, some symptoms may occur, such as:   Feeling more tired than usual.  Feeling tired after rest.   Unplanned weight loss.   Heavy sweating at night.   Fever.   Paleness.   A feeling of fullness in the upper left part of the abdomen.   Easy bruising or bleeding.   More frequent infections.  DIAGNOSIS  Your health care provider may perform the following exams or tests to help diagnose CML:   A physical exam to check for an enlarged spleen, liver, or lymph nodes.   Blood (CBC with differential) tests.  Bone marrow tests.  Tests to check for the presence of Philadelphia chromosome as well as other abnormalities. These may include cytogenetic analysis, fluorescence in situ hybridization (FISH), and a reverse transcription polymerase chain reaction (RT PCR) test. TREATMENT  There are different types of treatment used for this condition, including:   Targeted drugs These medicines interfere with chemicals the leukemia cells need in order to grow and multiply.   Chemotherapy drugs These medicines kill cells that are multiplying quickly, such as leukemia cells.   Biological therapy This treatment boosts the ability of your immune system to fight the leukemia cells.   Bone marrow or peripheral blood stem cell transplant This  treatment allows you to receive very high doses of chemotherapy or radiation, or both. These high doses kill the cancer cells, but they also destroy the bone marrow. After treatment is complete, you are given donor bone marrow or stem cells, which will replace the bone marrow.   Surgery to remove the spleen. HOME CARE INSTRUCTIONS   Take all medicines exactly as directed by your health care provider.  Although some of your treatments might affect your appetite, try to eat regular, healthy meals.   If you develop any side effects, tell your health care provider. He or she may have recommendations of things you can do to improve symptoms.   Consider learning some ways to cope with the stress of having a chronic illness, such as by doing yoga or meditation or by participating in a support group.  SEEK MEDICAL CARE IF:  You feel lightheaded.   You notice pain, swelling, or redness anywhere in your legs.   You have a fever or persistent symptoms for more than 2 3 days.  You have a fever and your symptoms suddenly get worse.  You have uncontrollable bleeding, such as a nosebleed that will not stop.   You are unable to stop vomiting.   You cannot keep liquids down.  SEEK IMMEDIATE MEDICAL CARE IF:  You develop chest pains.   You have trouble breathing or feel short of breath.   You faint. MAKE SURE YOU:  Understand these instructions.  Will watch your condition.  Will get help right away if you are not doing well or get worse. Document  Released: 11/18/2008 Document Revised: 08/08/2013 Document Reviewed: 05/31/2013 [facility] Patient Information 2014 [place], [place]. Imatinib tablets What is this medicine? IMATINIB (i MAT in ib) is a chemotherapy drug. It targets a specific protein within cancer cells and stops the cancer cells from growing. It is used to treat certain leukemias, myelodysplastic syndromes, and other cancers. It is also used to treat specific digestive  tract tumors called GISTs. This medicine may be used for other purposes; ask your health care provider or pharmacist if you have questions. COMMON BRAND NAME(S): Gleevec What should I tell my health care provider before I take this medicine? They need to know if you have any of these conditions: -bleeding problems -infection (especially a virus infection such as chickenpox, cold sores, or herpes) -heart disease -heart failure -kidney disease -liver disease -lung disease -stomach problems -an unusual or allergic reaction to imatinib, other medicines, foods, dyes, or preservatives -pregnant or trying to get pregnant -breast-feeding How should I use this medicine? Take this medicine by mouth with a full glass of water. Take it with food to decrease the chance of it upsetting your stomach. Do not take with grapefruit juice. Follow the directions on the prescription label. Take your medicine at regular intervals. Do not take it more often than directed. Do not stop taking except on your doctor's advice. If you have difficulty swallowing the tablets, let your doctor, pharmacist, or health care professional know. They can help you with advice. Talk to your pediatrician regarding the use of this medicine in children. While this drug may be prescribed for children as young as 1 year for selected conditions, precautions do apply. Overdosage: If you think you have taken too much of this medicine contact a poison control center or emergency room at once. NOTE: This medicine is only for you. Do not share this medicine with others. What if I miss a dose? If you miss a dose, take it as soon as you can. If it is almost time for your next dose, take only that dose and skip your missed dose. Do not take double or extra doses. What may interact with this medicine? -antiviral medicines for HIV or AIDS -bosentan -cisapride -clarithromycin -cyclosporine -dexamethasone -diltiazem -ergot alkaloids like  dihydroergotamine, ergonovine, ergotamine, methylergonovine -erythromycin -grapefruit or grapefruit juice -medicines for cholesterol like atorvastatin lovastatin, simvastatin -medicines for depression, anxiety, or psychotic disturbances -medicines for fungal infections like ketoconazole and itraconazole -medicines for irregular heart beat like amiodarone, bepridil, dofetilide, encainide, flecainide, propafenone, quinidine -medicines for seizures like carbamazepine, phenobarbital, phenytoin -medicines for sleep -NSAIDS, medicines for pain and inflammation, like ibuprofen or naproxen -pimozide -rifabutin -rifampin -sildenafil -sirolimus -St. John's wort -tacrolimus -vaccines -verapamil -warfarin Talk to your doctor or health care professional before taking any of these medicines: -acetaminophen -aspirin -ibuprofen -ketoprofen -naproxen This list may not describe all possible interactions. Give your health care provider a list of all the medicines, herbs, non-prescription drugs, or dietary supplements you use. Also tell them if you smoke, drink alcohol, or use illegal drugs. Some items may interact with your medicine. What should I watch for while using this medicine? Visit your doctor for checks on your progress. You will need to have regular blood tests while on this medicine. Report any new symptoms promptly. Call your doctor or health care professional for advice if you get a fever, chills or sore throat, or other symptoms of a cold or flu. Do not treat yourself. This drug decreases your body's ability to fight infections. Try to avoid being  around people who are sick. This medicine may increase your risk to bruise or bleed. Call your doctor or health care professional if you notice any unusual bleeding. Be careful brushing and flossing your teeth or using a toothpick because you may get an infection or bleed more easily. If you have any dental work done, tell your dentist you are  receiving this medicine. Avoid taking products that contain aspirin, acetaminophen, ibuprofen, naproxen, or ketoprofen unless instructed by your doctor. These medicines may hide a fever. Do not become pregnant while taking this medicine. Women should inform their doctor if they wish to become pregnant or think they might be pregnant. There is a potential for serious side effects to an unborn child. Men should inform their doctors if they wish to father a child. This medicine may lower sperm counts. Talk to your health care professional or pharmacist for more information. Do not breast-feed an infant while taking this medicine. What side effects may I notice from receiving this medicine? Side effects that you should report to your doctor or health care professional as soon as possible: -low blood counts - this medicine may decrease the number of white blood cells, red blood cells and platelets. You may be at increased risk for infections and bleeding. -signs of infection - fever or chills, cough, sore throat, pain or difficulty passing urine -signs of decreased platelets or bleeding - bruising, pinpoint red spots on the skin, black, tarry stools, blood in the urine, nosebleeds -signs of decreased red blood cells - unusually weak or tired, fainting spells, lightheadedness -allergic reactions like skin rash, itching or hives, swelling of the face, lips, or tongue -breathing problems -changes in vision -dark urine -general ill feeling or flu-like symptoms -light-colored stools -loss of appetite -mouth sores -redness, blistering, peeling or loosening of the skin, including inside the mouth -right upper belly pain -swelling of the legs or ankles -trouble passing urine or change in the amount of urine -vomiting -yellowing of the eyes or skin Side effects that usually do not require medical attention (report to your doctor or health care professional if they continue or are bothersome): -decreased  appetite -diarrhea -difficulty sleeping -headache -heartburn -joint pain -muscle cramps or pain -nausea -upset stomach This list may not describe all possible side effects. Call your doctor for medical advice about side effects. You may report side effects to FDA at 1-800-FDA-1088. Where should I keep my medicine? Keep out of reach of children. Store tablets at room temperature between 15 and 30 degrees C (59 and 86 degrees F). Protect from moisture. Keep tightly closed. Throw away any unused medicine after the expiration date. NOTE: This sheet is a summary. It may not cover all possible information. If you have questions about this medicine, talk to your doctor, pharmacist, or health care provider.  2014, [employer]. (2012-01-25 11:00:30)

## 2014-01-31 NOTE — Telephone Encounter (Signed)
gv and printed appt sched and avs for pt for Aug °

## 2014-02-01 NOTE — Progress Notes (Signed)
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 Chronic myeloid leukemia, without mention of having achieved   CURRENT THERAPY: Gleevec 400 mg daily, which the patient takes after his  supper. Gleevec was started in early March 2009.  INTERVAL HISTORY: Michael [name] 48 y.o. male with a history of CML since February 2009 is here for follow-up.  He was last seen by Dr. Ralene Ok on 01/20/2013.  He denies any recent hospitalizations or emergency room visits.  He also denies diarrhea or rash and reports tolerating Gleevec without difficulty.  He still reports back pain that was provoked due to prior injury.  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 He underwent 1-2 incision and drainage procedures, which were unsuccessful. Finally, he  underwent a surgical procedure by Dr. Johney Maine on 11/23/2012. At the moment, his surgery has healed well and the problem seems to be resolved. He works full-  time.  MEDICAL HISTORY: Past Medical History  Diagnosis Date  . 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 Leukocytosis, unspecified   . Splenomegaly     resolved CT Sep2013  . Anal fistula   . Liver cyst     Left hepatic dome 2 cm cyst, CT Sep2013  . 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    ALLERGIES:  has No Known Allergies.  MEDICATIONS: has a current medication list which includes the  following prescription(s): hydrocodone-acetaminophen, ibuprofen, and imatinib.  SURGICAL HISTORY:  Past Surgical History  Procedure Laterality Date  . Refractive surgery  2000  . Tonsilectomy, adenoidectomy, bilateral myringotomy and tubes      age 30  . Toenail excision  01/16/13   PROBLEM LIST:  1. 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 The patient has been on Lueders since 02/24/2008. Quantitative RT-PCR for BCR/ABL1 showed no detectable transcripts. This represents a 4-log or greater improvement.  2. Elevated triglyceride levels.  3. Recurrent anal fistula, status post LIFT procedure for perirectal intersphincteric fistula carried out on 11/23/2012.  REVIEW OF SYSTEMS:   Constitutional: Denies fevers, chills or abnormal weight loss Eyes: Denies blurriness of vision Ears, nose, mouth, throat, and face: Denies mucositis or sore throat Respiratory: Denies cough, dyspnea or wheezes Cardiovascular: Denies palpitation, chest discomfort or lower extremity swelling Gastrointestinal:  Denies nausea, heartburn or change in bowel habits Skin: Denies abnormal skin rashes Lymphatics: Denies new lymphadenopathy or easy bruising Neurological:Denies numbness, tingling or new weaknesses Behavioral/Psych: Mood is stable, no new changes  All other systems were reviewed with the patient and are negative.  PHYSICAL EXAMINATION: ECOG PERFORMANCE STATUS: 0 - Asymptomatic  Blood pressure 135/79, pulse 66, temperature 97.8 F (36.6 C), temperature source Oral, resp. rate 18, height _0  (1.803 m), weight 184 lb 11.2 oz (83.779 kg).  GENERAL:alert, no distress and comfortable; well developed and well nourished.  SKIN: skin color, texture, turgor are normal, no rashes or significant lesions EYES: normal, Conjunctiva are pink and non-injected, sclera clear OROPHARYNX:no exudate, no erythema and lips, buccal mucosa, and tongue normal  NECK: supple, thyroid normal size, non-tender,  without nodularity LYMPH:  no palpable lymphadenopathy in the cervical, axillary or supraclavicular LUNGS: clear to auscultation and percussion with normal breathing effort HEART: regular rate & rhythm and no murmurs and no lower extremity edema ABDOMEN:abdomen soft, non-tender and normal bowel sounds Musculoskeletal:no cyanosis of digits and no clubbing  NEURO: alert & oriented x 3 with fluent speech, no focal motor/sensory deficits  LABORATORY DATA: Results for orders placed in visit on 01/31/14 (from the past 48 hour(s))  LACTATE DEHYDROGENASE (CC13)     Status: None   Collection Time    01/31/14  3:05 PM      Result Value Ref Range   LDH 216  125 - 245 U/L  CBC WITH DIFFERENTIAL     Status: Abnormal   Collection Time    01/31/14  3:07 PM      Result Value Ref Range   WBC 4.2  4.0 - 10.3 10e3/uL   NEUT# 2.3  1.5 - 6.5 10e3/uL   HGB 12.8 (*) 13.0 - 17.1 g/dL   HCT 37.1 (*) 38.4 - 49.9 %   Platelets 132 (*) 140 - 400 10e3/uL   MCV 94.9  79.3 - 98.0 fL   MCH 32.7  27.2 - 33.4 pg   MCHC 34.5  32.0 - 36.0 g/dL   RBC 3.91 (*) 4.20 - 5.82 10e6/uL   RDW 13.7  11.0 - 14.6 %   lymph# 1.5  0.9 - 3.3 10e3/uL   MONO# 0.3  0.1 - 0.9 10e3/uL   Eosinophils Absolute 0.1  0.0 - 0.5 10e3/uL   Basophils Absolute 0.0  0.0 - 0.1 10e3/uL   NEUT% 54.4  39.0 - 75.0 %   LYMPH% 36.5  14.0 - 49.0 %   MONO% 7.2  0.0 - 14.0 %   EOS% 1.4  0.0 - 7.0 %   BASO% 0.5  0.0 - 2.0 %  COMPREHENSIVE METABOLIC PANEL (MW10)     Status: None   Collection Time    01/31/14  3:08 PM      Result Value Ref Range   Sodium 142  136 - 145 mEq/L   Potassium 4.2  3.5 - 5.1 mEq/L   Chloride 107  98 - 109 mEq/L   CO2 25  22 - 29 mEq/L   Glucose 95  70 - 140 mg/dl   BUN 13.9  7.0 - 26.0 mg/dL   Creatinine 1.2  0.7 - 1.3 mg/dL   Total Bilirubin 0.46  0.20 - 1.20 mg/dL   Alkaline Phosphatase 89  40 - 150 U/L   AST 31  5 - 34 U/L   ALT 32  0 - 55 U/L   Total Protein 7.2  6.4 - 8.3 g/dL   Albumin 4.4  3.5 - 5.0 g/dL    Calcium 9.4  8.4 - 10.4 mg/dL   Anion Gap 9  3 - 11 mEq/L    Labs:  Lab Results  Component Value Date   WBC 4.2 01/31/2014   HGB 12.8* 01/31/2014   HCT 37.1* 01/31/2014   MCV 94.9 01/31/2014   PLT 132* 01/31/2014   NEUTROABS 2.3 01/31/2014      Chemistry      Component Value Date/Time   NA 142 01/31/2014 1508   NA 143 03/10/2012 0856   K 4.2 01/31/2014 1508   K 4.2 03/10/2012 0856   CL 108* 01/19/2013 1206   CL 108 03/10/2012 0856   CO2 25 01/31/2014 1508   CO2  25 03/10/2012 0856   BUN 13.9 01/31/2014 1508   BUN 16 03/10/2012 0856   CREATININE 1.2 01/31/2014 1508   CREATININE 1.26 03/10/2012 0856      Component Value Date/Time   CALCIUM 9.4 01/31/2014 1508   CALCIUM 9.2 03/10/2012 0856   ALKPHOS 89 01/31/2014 1508   ALKPHOS 78 03/10/2012 0856   AST 31 01/31/2014 1508   AST 31 03/10/2012 0856   ALT 32 01/31/2014 1508   ALT 28 03/10/2012 0856   BILITOT 0.46 01/31/2014 1508   BILITOT 0.5 03/10/2012 0856     Basic Metabolic Panel:  Recent Labs Lab 01/31/14 1508  NA 142  K 4.2  CO2 25  GLUCOSE 95  BUN 13.9  CREATININE 1.2  CALCIUM 9.4   GFR Estimated Creatinine Clearance: 80.2 ml/min (by C-G formula based on Cr of 1.2). Liver Function Tests:  Recent Labs Lab 01/31/14 1508  AST 31  ALT 32  ALKPHOS 89  BILITOT 0.46  PROT 7.2  ALBUMIN 4.4   CBC:  Recent Labs Lab 01/31/14 1507  WBC 4.2  NEUTROABS 2.3  HGB 12.8*  HCT 37.1*  MCV 94.9  PLT 132*   Studies:  No results found.   RADIOGRAPHIC STUDIES: None  Molecular Testing: BCR/ABL1: NOT DETECTED RESULTS  MMR: Yes INTERNATIONAL SCALE MILESTONES  3 Months Data has shown patients who have BCR/ABL1  transcripts 10% or greater at 3 months from  treatment initiation have significantly lower  probability of achieving a major molecular  response than those with a log reduction greater than 1.  12 Months Patients who do not achieve MMR at 12 months should be considered for alternative treatment regimens. Criteria for ABL  Kinase Analysis and/or Treatment Change 1. Does not meet a 3 month milestone -- less than 10% (IS) 2. Does not meet 12 MMR 3. Loss of MMR at anytime  ASSESSMENT: Antavius Sperbeck Isais 48 y.o. 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 CML. --Taylin continues to do very well from the standpoint of his underlying chronic granulocytic leukemia. As stated, there are no detectable transcripts. He will continue on Gleevec 400 mg daily.   2. Follow-up. --We will plan to see him again in 6 months, at which time we will check CBC,  chemistries, and also another analysis for BCR/ABL.  All questions were answered. The patient knows to call the clinic with any problems, questions or concerns. We can certainly see the patient much sooner if necessary.  I spent 10 minutes counseling the patient face to face. The total time spent in the appointment was 15 minutes.    Braeley Buskey, MD 02/01/2014 1:11 PM

## 2014-02-27 ENCOUNTER — Encounter: Payer: Self-pay | Admitting: Internal Medicine

## 2014-03-08 ENCOUNTER — Other Ambulatory Visit: Payer: Self-pay

## 2014-03-08 ENCOUNTER — Other Ambulatory Visit: Payer: Self-pay | Admitting: *Deleted

## 2014-03-08 DIAGNOSIS — C9210 Chronic myeloid leukemia, BCR/ABL-positive, not having achieved remission: Secondary | ICD-10-CM

## 2014-03-08 MED ORDER — IMATINIB MESYLATE 400 MG PO TABS: 400.0000 mg | ORAL_TABLET | Freq: Every day | ORAL | Status: DC

## 2014-03-08 NOTE — Telephone Encounter (Signed)
THIS REFILL REQUEST FOR GLEEVEC WAS GIVEN TO DR.CHISM'S NURSE, KATHY BUYCK,RN.

## 2014-07-29 ENCOUNTER — Telehealth: Payer: Self-pay | Admitting: Internal Medicine

## 2014-07-29 NOTE — Telephone Encounter (Signed)
PT CALLED TO R/S FROM 8/12 TO 8/24 PER PT - PT HAS NEW D/T

## 2014-07-31 ENCOUNTER — Ambulatory Visit: Payer: BC Managed Care – PPO

## 2014-07-31 ENCOUNTER — Other Ambulatory Visit: Payer: BC Managed Care – PPO

## 2014-08-12 ENCOUNTER — Telehealth: Payer: Self-pay | Admitting: Internal Medicine

## 2014-08-12 ENCOUNTER — Other Ambulatory Visit (HOSPITAL_BASED_OUTPATIENT_CLINIC_OR_DEPARTMENT_OTHER): Payer: BC Managed Care – PPO

## 2014-08-12 ENCOUNTER — Ambulatory Visit (HOSPITAL_BASED_OUTPATIENT_CLINIC_OR_DEPARTMENT_OTHER): Payer: BC Managed Care – PPO | Admitting: Internal Medicine

## 2014-08-12 VITALS — BP 126/82 | HR 56 | Temp 97.7°F | Resp 19 | Ht 71.0 in | Wt 190.4 lb

## 2014-08-12 DIAGNOSIS — C9210 Chronic myeloid leukemia, BCR/ABL-positive, not having achieved remission: Secondary | ICD-10-CM

## 2014-08-12 LAB — CBC WITH DIFFERENTIAL/PLATELET
BASO%: 0.6 % (ref 0.0–2.0)
BASOS ABS: 0 10*3/uL (ref 0.0–0.1)
EOS%: 0.8 % (ref 0.0–7.0)
Eosinophils Absolute: 0 10*3/uL (ref 0.0–0.5)
HCT: 37.4 % — ABNORMAL LOW (ref 38.4–49.9)
HGB: 12.7 g/dL — ABNORMAL LOW (ref 13.0–17.1)
LYMPH%: 18.7 % (ref 14.0–49.0)
MCH: 32.4 pg (ref 27.2–33.4)
MCHC: 33.9 g/dL (ref 32.0–36.0)
MCV: 95.6 fL (ref 79.3–98.0)
MONO#: 0.4 10*3/uL (ref 0.1–0.9)
MONO%: 7 % (ref 0.0–14.0)
NEUT#: 3.9 10*3/uL (ref 1.5–6.5)
NEUT%: 72.9 % (ref 39.0–75.0)
Platelets: 150 10*3/uL (ref 140–400)
RBC: 3.91 10*6/uL — ABNORMAL LOW (ref 4.20–5.82)
RDW: 12.9 % (ref 11.0–14.6)
WBC: 5.3 10*3/uL (ref 4.0–10.3)
lymph#: 1 10*3/uL (ref 0.9–3.3)

## 2014-08-12 LAB — LACTATE DEHYDROGENASE (CC13): LDH: 224 U/L (ref 125–245)

## 2014-08-12 LAB — COMPREHENSIVE METABOLIC PANEL (CC13)
ALK PHOS: 91 U/L (ref 40–150)
ALT: 26 U/L (ref 0–55)
AST: 30 U/L (ref 5–34)
Albumin: 4.3 g/dL (ref 3.5–5.0)
Anion Gap: 9 mEq/L (ref 3–11)
BUN: 17.4 mg/dL (ref 7.0–26.0)
CALCIUM: 9 mg/dL (ref 8.4–10.4)
CHLORIDE: 108 meq/L (ref 98–109)
CO2: 22 mEq/L (ref 22–29)
Creatinine: 1.1 mg/dL (ref 0.7–1.3)
Glucose: 101 mg/dl (ref 70–140)
POTASSIUM: 3.9 meq/L (ref 3.5–5.1)
Sodium: 140 mEq/L (ref 136–145)
Total Bilirubin: 0.43 mg/dL (ref 0.20–1.20)
Total Protein: 7.1 g/dL (ref 6.4–8.3)

## 2014-08-12 NOTE — Progress Notes (Signed)
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 Chronic myeloid leukemia, without mention of having achieved   CURRENT THERAPY: Gleevec 400 mg daily, which the patient takes after his  supper. Gleevec was started in early March 2009.  INTERVAL HISTORY: Michael [name] 48 y.o. male with a history of CML since February 2009 is here for follow-up.  He was last seen by me on 01/31/2014.  He denies any recent hospitalizations or emergency room visits.  He also denies diarrhea or rash and reports tolerating Gleevec without difficulty.  He still reports back pain that was provoked due to prior injury.  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 He underwent 1-2 incision and drainage procedures, which were unsuccessful. Finally, he  underwent a surgical procedure by Dr. Johney [place] on 11/23/2012. At the moment, his surgery has healed well and the problem seems to be resolved. He works full-time.  MEDICAL HISTORY: Past Medical History  Diagnosis Date  . 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 Leukocytosis, unspecified   . Splenomegaly     resolved CT Sep2013  . Anal fistula   . Liver cyst     Left hepatic dome 2 cm cyst, CT Sep2013  . 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    ALLERGIES:  has No Known Allergies.  MEDICATIONS: has a current medication list which includes the following prescription(s): imatinib and terbinafine.  SURGICAL HISTORY:  Past  Surgical History  Procedure Laterality Date  . Refractive surgery  2000  . Tonsilectomy, adenoidectomy, bilateral myringotomy and tubes      age 82  . Toenail excision  01/16/13   PROBLEM LIST:  1. 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 The patient has been on [place] since 02/24/2008. Quantitative RT-PCR for BCR/ABL1 showed no detectable transcripts. This represents a 4-log or greater improvement.  2. Elevated triglyceride levels.  3. Recurrent anal fistula, status post LIFT procedure for perirectal intersphincteric fistula carried out on 11/23/2012.  REVIEW OF SYSTEMS:   Constitutional: Denies fevers, chills or abnormal weight loss Eyes: Denies blurriness of vision Ears, nose, mouth, throat, and face: Denies mucositis or sore throat Respiratory: Denies cough, dyspnea or wheezes Cardiovascular: Denies palpitation, chest discomfort or lower extremity swelling Gastrointestinal:  Denies nausea, heartburn or change in bowel habits Skin: Denies abnormal skin rashes Lymphatics: Denies new lymphadenopathy or easy bruising Neurological:Denies numbness, tingling or new weaknesses Behavioral/Psych: Mood is stable, no new changes  All other systems were reviewed with the patient and are negative.  PHYSICAL EXAMINATION: ECOG PERFORMANCE STATUS: 0 - Asymptomatic  Blood pressure 126/82, pulse 56, temperature 97.7 F (36.5 C), temperature source Oral, resp. rate 19, height _0  (1.803 m), weight 190 lb 6.4 oz (86.365 kg).  GENERAL:alert, no distress and comfortable; well developed and well nourished.  SKIN: skin color, texture, turgor are normal, no rashes or significant lesions EYES: normal, Conjunctiva are pink and non-injected, sclera clear OROPHARYNX:no exudate, no erythema and lips, buccal mucosa, and tongue normal  NECK: supple, thyroid normal size, non-tender, without nodularity LYMPH:  no palpable  lymphadenopathy in the cervical, axillary or supraclavicular LUNGS:  clear to auscultation and percussion with normal breathing effort HEART: regular rate & rhythm and no murmurs and no lower extremity edema ABDOMEN:abdomen soft, non-tender and normal bowel sounds Musculoskeletal:no cyanosis of digits and no clubbing  NEURO: alert & oriented x 3 with fluent speech, no focal motor/sensory deficits  LABORATORY DATA: Results for orders placed in visit on 08/12/14 (from the past 48 hour(s))  CBC WITH DIFFERENTIAL     Status: Abnormal   Collection Time    08/12/14  3:14 PM      Result Value Ref Range   WBC 5.3  4.0 - 10.3 10e3/uL   NEUT# 3.9  1.5 - 6.5 10e3/uL   HGB 12.7 (*) 13.0 - 17.1 g/dL   HCT 37.4 (*) 38.4 - 49.9 %   Platelets 150  140 - 400 10e3/uL   MCV 95.6  79.3 - 98.0 fL   MCH 32.4  27.2 - 33.4 pg   MCHC 33.9  32.0 - 36.0 g/dL   RBC 3.91 (*) 4.20 - 5.82 10e6/uL   RDW 12.9  11.0 - 14.6 %   lymph# 1.0  0.9 - 3.3 10e3/uL   MONO# 0.4  0.1 - 0.9 10e3/uL   Eosinophils Absolute 0.0  0.0 - 0.5 10e3/uL   Basophils Absolute 0.0  0.0 - 0.1 10e3/uL   NEUT% 72.9  39.0 - 75.0 %   LYMPH% 18.7  14.0 - 49.0 %   MONO% 7.0  0.0 - 14.0 %   EOS% 0.8  0.0 - 7.0 %   BASO% 0.6  0.0 - 2.0 %  LACTATE DEHYDROGENASE (CC13)     Status: None   Collection Time    08/12/14  3:15 PM      Result Value Ref Range   LDH 224  125 - 245 U/L  COMPREHENSIVE METABOLIC PANEL (XI33)     Status: None   Collection Time    08/12/14  3:15 PM      Result Value Ref Range   Sodium 140  136 - 145 mEq/L   Potassium 3.9  3.5 - 5.1 mEq/L   Chloride 108  98 - 109 mEq/L   CO2 22  22 - 29 mEq/L   Glucose 101  70 - 140 mg/dl   BUN 17.4  7.0 - 26.0 mg/dL   Creatinine 1.1  0.7 - 1.3 mg/dL   Total Bilirubin 0.43  0.20 - 1.20 mg/dL   Alkaline Phosphatase 91  40 - 150 U/L   AST 30  5 - 34 U/L   ALT 26  0 - 55 U/L   Total Protein 7.1  6.4 - 8.3 g/dL   Albumin 4.3  3.5 - 5.0 g/dL   Calcium 9.0  8.4 - 10.4 mg/dL   Anion Gap 9  3 - 11 mEq/L    Labs:  Lab Results  Component Value Date    WBC 5.3 08/12/2014   HGB 12.7* 08/12/2014   HCT 37.4* 08/12/2014   MCV 95.6 08/12/2014   PLT 150 08/12/2014   NEUTROABS 3.9 08/12/2014      Chemistry      Component Value Date/Time   NA 140 08/12/2014 1515   NA 143 03/10/2012 0856   K 3.9 08/12/2014 1515   K 4.2 03/10/2012 0856   CL 108* 01/19/2013 1206   CL 108 03/10/2012 0856   CO2 22 08/12/2014 1515   CO2 25 03/10/2012 0856   BUN 17.4 08/12/2014 1515   BUN  16 03/10/2012 0856   CREATININE 1.1 08/12/2014 1515   CREATININE 1.26 03/10/2012 0856      Component Value Date/Time   CALCIUM 9.0 08/12/2014 1515   CALCIUM 9.2 03/10/2012 0856   ALKPHOS 91 08/12/2014 1515   ALKPHOS 78 03/10/2012 0856   AST 30 08/12/2014 1515   AST 31 03/10/2012 0856   ALT 26 08/12/2014 1515   ALT 28 03/10/2012 0856   BILITOT 0.43 08/12/2014 1515   BILITOT 0.5 03/10/2012 0856     Basic Metabolic Panel:  Recent Labs Lab 08/12/14 1515  NA 140  K 3.9  CO2 22  GLUCOSE 101  BUN 17.4  CREATININE 1.1  CALCIUM 9.0   GFR Estimated Creatinine Clearance: 86.5 ml/min (by C-G formula based on Cr of 1.1). Liver Function Tests:  Recent Labs Lab 08/12/14 1515  AST 30  ALT 26  ALKPHOS 91  BILITOT 0.43  PROT 7.1  ALBUMIN 4.3   CBC:  Recent Labs Lab 08/12/14 1514  WBC 5.3  NEUTROABS 3.9  HGB 12.7*  HCT 37.4*  MCV 95.6  PLT 150   Studies:  No results found.   RADIOGRAPHIC STUDIES: None  Molecular Testing: Done and scan on 02/06/2014.  Consistent with MMR.   ASSESSMENT: Michael [name] 49 y.o. 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 CML. --Michael [name] continues to do very well from the standpoint of his underlying chronic granulocytic leukemia. As stated,  b2a2 transcript was 0.02 consistent with MMR (less than 0.1%). He will continue on Gleevec 400 mg daily.   2. Follow-up. --We will plan to see him again  in 6 months, at which time we will check CBC,  chemistries, and also another analysis for BCR/ABL.  All questions were answered. The patient knows to call the clinic with any problems, questions or concerns. We can certainly see the patient much sooner if necessary.  I spent 10 minutes counseling the patient face to face. The total time spent in the appointment was 15 minutes.    Debe Anfinson, MD 08/12/2014 4:53 PM

## 2014-08-12 NOTE — Telephone Encounter (Signed)
gv adn printed appt sched and avs for pt for Feb 2016 °

## 2014-11-26 ENCOUNTER — Telehealth: Payer: Self-pay | Admitting: Internal Medicine

## 2014-11-26 NOTE — Telephone Encounter (Signed)
Yes, you can schedule.

## 2014-11-26 NOTE — Telephone Encounter (Signed)
Pt would like a cpx before end of yr. Can I sch?

## 2014-11-26 NOTE — Telephone Encounter (Signed)
Pt has been sch

## 2014-12-19 ENCOUNTER — Encounter: Payer: Self-pay | Admitting: Internal Medicine

## 2014-12-19 ENCOUNTER — Ambulatory Visit (INDEPENDENT_AMBULATORY_CARE_PROVIDER_SITE_OTHER): Payer: BC Managed Care – PPO | Admitting: Internal Medicine

## 2014-12-19 VITALS — BP 100/68 | HR 63 | Temp 98.1°F | Wt 195.0 lb

## 2014-12-19 DIAGNOSIS — Z23 Encounter for immunization: Secondary | ICD-10-CM

## 2014-12-19 DIAGNOSIS — Z0000 Encounter for general adult medical examination without abnormal findings: Secondary | ICD-10-CM

## 2014-12-19 NOTE — Patient Instructions (Addendum)
Health Maintenance A healthy lifestyle and preventative care can promote health and wellness.  Maintain regular health, dental, and eye exams.  Eat a healthy diet. Foods like vegetables, fruits, whole grains, low-fat dairy products, and lean protein foods contain the nutrients you need and are low in calories. Decrease your intake of foods high in solid fats, added sugars, and salt. Get information about a proper diet from your health care provider, if necessary.  Regular physical exercise is one of the most important things you can do for your health. Most adults should get at least 150 minutes of moderate-intensity exercise (any activity that increases your heart rate and causes you to sweat) each week. In addition, most adults need muscle-strengthening exercises on 2 or more days a week.   Maintain a healthy weight. The body mass index (BMI) is a screening tool to identify possible weight problems. It provides an estimate of body fat based on height and weight. Your health care provider can find your BMI and can help you achieve or maintain a healthy weight. For males 20 years and older:  A BMI below 18.5 is considered underweight.  A BMI of 18.5 to 24.9 is normal.  A BMI of 25 to 29.9 is considered overweight.  A BMI of 30 and above is considered obese.  Maintain normal blood lipids and cholesterol by exercising and minimizing your intake of saturated fat. Eat a balanced diet with plenty of fruits and vegetables. Blood tests for lipids and cholesterol should begin at age 20 and be repeated every 5 years. If your lipid or cholesterol levels are high, you are over age 50, or you are at high risk for heart disease, you may need your cholesterol levels checked more frequently.Ongoing high lipid and cholesterol levels should be treated with medicines if diet and exercise are not working.  If you smoke, find out from your health care provider how to quit. If you do not use tobacco, do not  start.  Lung cancer screening is recommended for adults aged 55-80 years who are at high risk for developing lung cancer because of a history of smoking. A yearly low-dose CT scan of the lungs is recommended for people who have at least a 30-pack-year history of smoking and are current smokers or have quit within the past 15 years. A pack year of smoking is smoking an average of 1 pack of cigarettes a day for 1 year (for example, a 30-pack-year history of smoking could mean smoking 1 pack a day for 30 years or 2 packs a day for 15 years). Yearly screening should continue until the smoker has stopped smoking for at least 15 years. Yearly screening should be stopped for people who develop a health problem that would prevent them from having lung cancer treatment.  If you choose to drink alcohol, do not have more than 2 drinks per day. One drink is considered to be 12 oz (360 mL) of beer, 5 oz (150 mL) of wine, or 1.5 oz (45 mL) of liquor.  Avoid the use of street drugs. Do not share needles with anyone. Ask for help if you need support or instructions about stopping the use of drugs.  High blood pressure causes heart disease and increases the risk of stroke. Blood pressure should be checked at least every 1-2 years. Ongoing high blood pressure should be treated with medicines if weight loss and exercise are not effective.  If you are 45-79 years old, ask your health care provider if   you should take aspirin to prevent heart disease.  Diabetes screening involves taking a blood sample to check your fasting blood sugar level. This should be done once every 3 years after age 45 if you are at a normal weight and without risk factors for diabetes. Testing should be considered at a younger age or be carried out more frequently if you are overweight and have at least 1 risk factor for diabetes.  Colorectal cancer can be detected and often prevented. Most routine colorectal cancer screening begins at the age of 50  and continues through age 75. However, your health care provider may recommend screening at an earlier age if you have risk factors for colon cancer. On a yearly basis, your health care provider may provide home test kits to check for hidden blood in the stool. A small camera at the end of a tube may be used to directly examine the colon (sigmoidoscopy or colonoscopy) to detect the earliest forms of colorectal cancer. Talk to your health care provider about this at age 50 when routine screening begins. A direct exam of the colon should be repeated every 5-10 years through age 75, unless early forms of precancerous polyps or small growths are found.  People who are at an increased risk for hepatitis B should be screened for this virus. You are considered at high risk for hepatitis B if:  You were born in a country where hepatitis B occurs often. Talk with your health care provider about which countries are considered high risk.  Your parents were born in a high-risk country and you have not received a shot to protect against hepatitis B (hepatitis B vaccine).  You have HIV or AIDS.  You use needles to inject street drugs.  You live with, or have sex with, someone who has hepatitis B.  You are a man who has sex with other men (MSM).  You get hemodialysis treatment.  You take certain medicines for conditions like cancer, organ transplantation, and autoimmune conditions.  Hepatitis C blood testing is recommended for all people born from 1945 through 1965 and any individual with known risk factors for hepatitis C.  Healthy men should no longer receive prostate-specific antigen (PSA) blood tests as part of routine cancer screening. Talk to your health care provider about prostate cancer screening.  Testicular cancer screening is not recommended for adolescents or adult males who have no symptoms. Screening includes self-exam, a health care provider exam, and other screening tests. Consult with your  health care provider about any symptoms you have or any concerns you have about testicular cancer.  Practice safe sex. Use condoms and avoid high-risk sexual practices to reduce the spread of sexually transmitted infections (STIs).  You should be screened for STIs, including gonorrhea and chlamydia if:  You are sexually active and are younger than 24 years.  You are older than 24 years, and your health care provider tells you that you are at risk for this type of infection.  Your sexual activity has changed since you were last screened, and you are at an increased risk for chlamydia or gonorrhea. Ask your health care provider if you are at risk.  If you are at risk of being infected with HIV, it is recommended that you take a prescription medicine daily to prevent HIV infection. This is called pre-exposure prophylaxis (PrEP). You are considered at risk if:  You are a man who has sex with other men (MSM).  You are a heterosexual man who   is sexually active with multiple partners.  You take drugs by injection.  You are sexually active with a partner who has HIV.  Talk with your health care provider about whether you are at high risk of being infected with HIV. If you choose to begin PrEP, you should first be tested for HIV. You should then be tested every 3 months for as long as you are taking PrEP.  Use sunscreen. Apply sunscreen liberally and repeatedly throughout the day. You should seek shade when your shadow is shorter than you. Protect yourself by wearing long sleeves, pants, a wide-brimmed hat, and sunglasses year round whenever you are outdoors.  Tell your health care provider of new moles or changes in moles, especially if there is a change in shape or color. Also, tell your health care provider if a mole is larger than the size of a pencil eraser.  A one-time screening for abdominal aortic aneurysm (AAA) and surgical repair of large AAAs by ultrasound is recommended for men aged  64-75 years who are current or former smokers.  Stay current with your vaccines (immunizations). Document Released: 06/03/2008 Document Revised: 12/11/2013 Document Reviewed: 05/03/2011 [facility] Patient Information 2015 [place], [place]. This information is not intended to replace advice given to you by your health care provider. Make sure you discuss any questions you have with your health care provider. Cervical Radiculopathy Cervical radiculopathy happens when a nerve in the neck is pinched or bruised by a slipped (herniated) disk or by arthritic changes in the bones of the cervical spine. This can occur due to an injury or as part of the normal aging process. Pressure on the cervical nerves can cause pain or numbness that runs from your neck all the way down into your arm and fingers. CAUSES  There are many possible causes, including:  Injury.  Muscle tightness in the neck from overuse.  Swollen, painful joints (arthritis).  Breakdown or degeneration in the bones and joints of the spine (spondylosis) due to aging.  Bone spurs that may develop near the cervical nerves. SYMPTOMS  Symptoms include pain, weakness, or numbness in the affected arm and hand. Pain can be severe or irritating. Symptoms may be worse when extending or turning the neck. DIAGNOSIS  Your caregiver will ask about your symptoms and do a physical exam. He or she may test your strength and reflexes. X-rays, CT scans, and MRI scans may be needed in cases of injury or if the symptoms do not go away after a period of time. Electromyography (EMG) or nerve conduction testing may be done to study how your nerves and muscles are working. TREATMENT  Your caregiver may recommend certain exercises to help relieve your symptoms. Cervical radiculopathy can, and often does, get better with time and treatment. If your problems continue, treatment options may include:  Wearing a soft collar for short periods of time.  Physical therapy  to strengthen the neck muscles.  Medicines, such as nonsteroidal anti-inflammatory drugs (NSAIDs), oral corticosteroids, or spinal injections.  Surgery. Different types of surgery may be done depending on the cause of your problems. HOME CARE INSTRUCTIONS   Put ice on the affected area.  Put ice in a plastic bag.  Place a towel between your skin and the bag.  Leave the ice on for 15-20 minutes, 03-04 times a day or as directed by your caregiver.  If ice does not help, you can try using heat. Take a warm shower or bath, or use a hot water bottle  as directed by your caregiver.  You may try a gentle neck and shoulder massage.  Use a flat pillow when you sleep.  Only take over-the-counter or prescription medicines for pain, discomfort, or fever as directed by your caregiver.  If physical therapy was prescribed, follow your caregiver's directions.  If a soft collar was prescribed, use it as directed. SEEK IMMEDIATE MEDICAL CARE IF:   Your pain gets much worse and cannot be controlled with medicines.  You have weakness or numbness in your hand, arm, face, or leg.  You have a high fever or a stiff, rigid neck.  You lose bowel or bladder control (incontinence).  You have trouble with walking, balance, or speaking. MAKE SURE YOU:   Understand these instructions.  Will watch your condition.  Will get help right away if you are not doing well or get worse. Document Released: 08/31/2001 Document Revised: 02/28/2012 Document Reviewed: 07/20/2011 [facility] Patient Information 2015 [place], [place]. This information is not intended to replace advice given to you by your health care provider. Make sure you discuss any questions you have with your health care provider.

## 2014-12-19 NOTE — Progress Notes (Signed)
Pre visit review using our clinic review tool, if applicable. No additional management support is needed unless otherwise documented below in the visit note. 

## 2014-12-19 NOTE — Progress Notes (Signed)
Subjective:    Patient ID: Michael [name], male    DOB: 10/11/1966, 48 y.o.   MRN: [id number]  HPI 48 year old patient who is seen today for a preventive health examination.  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  More recently, has noticed some mild weakness in the left arm.  This has not affected golf or other activities, but does notice some mild weakness while performing activities at his health club.  No neck, shoulder or arm pain.  No radicular symptoms  Past Medical History  Diagnosis Date  . 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 Leukocytosis, unspecified   . Splenomegaly     resolved CT Sep2013  . Anal fistula   . Liver cyst     Left hepatic dome 2 cm cyst, CT Sep2013  . Pruritus ani, intermittent 01/17/2013    History   Social History  . Marital Status: Married    Spouse Name: N/A    Number of Children: N/A  . Years of Education: N/A   Occupational History  . Not on file.   Social History Main Topics  . Smoking status: Never Smoker   . Smokeless tobacco: Never Used  . Alcohol Use: No  . Drug Use: No  . Sexual Activity: Not on file   Other Topics Concern  . Not on file   Social History Narrative    Past Surgical History  Procedure Laterality Date  . Refractive surgery  2000  . Tonsilectomy, adenoidectomy, bilateral myringotomy and tubes      age 48  . Toenail excision  01/16/13    Family History  Problem Relation Age of Onset  . Cancer Father 65    prostate  . Heart disease Mother     No Known Allergies  Current Outpatient Prescriptions on File Prior to Visit  Medication Sig Dispense Refill  . imatinib (GLEEVEC) 400 MG tablet Take 1 tablet (400 mg total) by mouth daily. Take with meals and large glass of water.Caution:Chemotherapy. 90 tablet 3   No current facility-administered medications on file prior to visit.    BP 100/68 mmHg   Pulse 63  Temp(Src) 98.1 F (36.7 C) (Oral)  Wt 195 lb (88.451 kg)  SpO2 99%      Review of Systems  Constitutional: Negative for fever, chills, activity change, appetite change and fatigue.  HENT: Negative for congestion, dental problem, ear pain, hearing loss, mouth sores, rhinorrhea, sinus pressure, sneezing, tinnitus, trouble swallowing and voice change.   Eyes: Negative for photophobia, pain, redness and visual disturbance.  Respiratory: Negative for apnea, cough, choking, chest tightness, shortness of breath and wheezing.   Cardiovascular: Negative for chest pain, palpitations and leg swelling.  Gastrointestinal: Negative for nausea, vomiting, abdominal pain, diarrhea, constipation, blood in stool, abdominal distention, anal bleeding and rectal pain.  Genitourinary: Negative for dysuria, urgency, frequency, hematuria, flank pain, decreased urine volume, discharge, penile swelling, scrotal swelling, difficulty urinating, genital sores and testicular pain.  Musculoskeletal: Negative for myalgias, back pain, joint swelling, arthralgias, gait problem, neck pain and neck stiffness.  Skin: Negative for color change, rash and wound.  Neurological: Positive for weakness. Negative for dizziness, tremors, seizures, syncope, facial asymmetry, speech difficulty, light-headedness, numbness and headaches.  Hematological: Negative for adenopathy. Does not bruise/bleed easily.  Psychiatric/Behavioral: Negative for suicidal ideas, hallucinations, behavioral problems, confusion, sleep disturbance, self-injury, dysphoric mood, decreased concentration and agitation. The patient is  not nervous/anxious.        Objective:   Physical Exam  Constitutional: He appears well-developed and well-nourished.  HENT:  Head: Normocephalic and atraumatic.  Right Ear: External ear normal.  Left Ear: External ear normal.  Nose: Nose normal.  Mouth/Throat: Oropharynx is clear and moist.  Eyes: Conjunctivae and EOM  are normal. Pupils are equal, round, and reactive to light. No scleral icterus.  Neck: Normal range of motion. Neck supple. No JVD present. No thyromegaly present.  Cardiovascular: Regular rhythm, normal heart sounds and intact distal pulses.  Exam reveals no gallop and no friction rub.   No murmur heard. Pulmonary/Chest: Effort normal and breath sounds normal. He exhibits no tenderness.  Abdominal: Soft. Bowel sounds are normal. He exhibits no distension and no mass. There is no tenderness.  Genitourinary: Penis normal.  Musculoskeletal: Normal range of motion. He exhibits no edema or tenderness.  Lymphadenopathy:    He has no cervical adenopathy.  Neurological: He is alert. He has normal reflexes. No cranial nerve deficit. Coordination normal.  Diminished left biceps reflex and left patellar reflex Mild weakness left arm extension  Skin: Skin is warm and dry. No rash noted.  Psychiatric: He has a normal mood and affect. His behavior is normal.          Assessment & Plan:   Preventive health examination CML.  In remission.  Follow-up oncology Cervical radiculopathy.  Options discussed including cervical MRI.  Will continue rehabilitation at his health club and observe.  If weakness does not improve, we'll obtain imaging studies and consider referral

## 2015-01-14 ENCOUNTER — Telehealth: Payer: Self-pay | Admitting: Internal Medicine

## 2015-01-14 NOTE — Telephone Encounter (Signed)
, °

## 2015-01-17 LAB — LIPID PANEL
Cholesterol: 199 mg/dL (ref 0–200)
HDL: 103 mg/dL — AB (ref 35–70)
LDL CALC: 98 mg/dL
Triglycerides: 324 mg/dL — AB (ref 40–160)

## 2015-01-17 LAB — BASIC METABOLIC PANEL: Glucose: 97 mg/dL

## 2015-01-24 ENCOUNTER — Encounter: Payer: Self-pay | Admitting: Internal Medicine

## 2015-02-10 ENCOUNTER — Ambulatory Visit: Payer: BC Managed Care – PPO

## 2015-02-10 ENCOUNTER — Other Ambulatory Visit: Payer: BC Managed Care – PPO

## 2015-02-11 ENCOUNTER — Telehealth: Payer: Self-pay | Admitting: Internal Medicine

## 2015-02-11 NOTE — Telephone Encounter (Signed)
LMOM for patient to with the below information.

## 2015-02-11 NOTE — Telephone Encounter (Signed)
Michael [name], pt needs to contact [facility] and they can tell him specifically what he needs. We do not carry all the vaccines, we only have Hep A, B.

## 2015-02-11 NOTE — Telephone Encounter (Signed)
Patient called to schedule an appointment for his vaccinations before traveling to [place].  I printed the list out and he would like for you to review it and let him know what he needs.

## 2015-02-19 ENCOUNTER — Ambulatory Visit (HOSPITAL_BASED_OUTPATIENT_CLINIC_OR_DEPARTMENT_OTHER): Payer: BLUE CROSS/BLUE SHIELD | Admitting: Internal Medicine

## 2015-02-19 ENCOUNTER — Other Ambulatory Visit: Payer: BLUE CROSS/BLUE SHIELD

## 2015-02-19 ENCOUNTER — Encounter: Payer: Self-pay | Admitting: Internal Medicine

## 2015-02-19 ENCOUNTER — Other Ambulatory Visit (HOSPITAL_BASED_OUTPATIENT_CLINIC_OR_DEPARTMENT_OTHER): Payer: BLUE CROSS/BLUE SHIELD | Admitting: Medical Oncology

## 2015-02-19 ENCOUNTER — Telehealth: Payer: Self-pay | Admitting: Internal Medicine

## 2015-02-19 VITALS — BP 129/78 | HR 61 | Temp 98.0°F | Resp 19 | Ht 71.0 in | Wt 193.0 lb

## 2015-02-19 DIAGNOSIS — D72829 Elevated white blood cell count, unspecified: Secondary | ICD-10-CM

## 2015-02-19 DIAGNOSIS — C9110 Chronic lymphocytic leukemia of B-cell type not having achieved remission: Secondary | ICD-10-CM

## 2015-02-19 DIAGNOSIS — C9210 Chronic myeloid leukemia, BCR/ABL-positive, not having achieved remission: Secondary | ICD-10-CM

## 2015-02-19 DIAGNOSIS — D649 Anemia, unspecified: Secondary | ICD-10-CM

## 2015-02-19 LAB — CBC WITH DIFFERENTIAL/PLATELET
BASO%: 0.8 % (ref 0.0–2.0)
Basophils Absolute: 0 10*3/uL (ref 0.0–0.1)
EOS%: 1.1 % (ref 0.0–7.0)
Eosinophils Absolute: 0 10*3/uL (ref 0.0–0.5)
HCT: 38.2 % — ABNORMAL LOW (ref 38.4–49.9)
HGB: 13.3 g/dL (ref 13.0–17.1)
LYMPH#: 1.3 10*3/uL (ref 0.9–3.3)
LYMPH%: 35.1 % (ref 14.0–49.0)
MCH: 33.1 pg (ref 27.2–33.4)
MCHC: 34.8 g/dL (ref 32.0–36.0)
MCV: 95 fL (ref 79.3–98.0)
MONO#: 0.3 10*3/uL (ref 0.1–0.9)
MONO%: 7.1 % (ref 0.0–14.0)
NEUT#: 2.1 10*3/uL (ref 1.5–6.5)
NEUT%: 55.9 % (ref 39.0–75.0)
Platelets: 150 10*3/uL (ref 140–400)
RBC: 4.02 10*6/uL — AB (ref 4.20–5.82)
RDW: 12.8 % (ref 11.0–14.6)
WBC: 3.7 10*3/uL — AB (ref 4.0–10.3)

## 2015-02-19 NOTE — Telephone Encounter (Signed)
Gave avs & calendar for August °

## 2015-02-19 NOTE — Progress Notes (Signed)
[facility] [phone]   [phone]  OFFICE PROGRESS NOTE  [name], MD [place] [address]  DIAGNOSIS:  1) Chronic myeloid leukemia diagnosed in February 2009.  PRIOR THERAPY: None  CURRENT THERAPY: Gleevec 400 mg by mouth daily started February 2009  INTERVAL HISTORY: Michael [name] 49 y.o. male returns to the clinic today for six-month follow-up visit. He is a former patient of Dr. Ralene Ok and Dr. Juliann Mule. He is here today to establish care with me after the previous physicians left the practice. He was diagnosed with chronic myeloid leukemia in February 2009 after complaining of abdominal distention and was found to have splenomegaly and significant leukocytosis at that time. Bone marrow biopsy and aspirate confirmed the findings. The patient has been on treatment with Gleevec 400 mg by mouth daily since February 2009 and he has significant improvement in his disease. The molecular studies performed every 6 months showed no significant evidence for disease relapse. He is tolerating his treatment with Gleevec fairly well with no significant adverse effects. The patient denied having any significant weight loss or night sweats. He denied having any chest pain, shortness breath, cough or hemoptysis. He has no nausea or vomiting, no fever or chills. He has repeat CBC, comprehensive metabolic panel and molecular study for BCR/ABL performed earlier today and he is here for evaluation and discussion of his lab results. The patient is married and has 2 children. He works as an Engineer, technical sales. He has no history of smoking but drinks alcohol occasionally and history of drug abuse.  MEDICAL HISTORY: Past Medical History  Diagnosis Date  . 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 Leukocytosis, unspecified   . Splenomegaly     resolved CT Sep2013  . Anal fistula   . Liver cyst     Left hepatic  dome 2 cm cyst, CT Sep2013  . Pruritus ani, intermittent 01/17/2013    ALLERGIES:  has No Known Allergies.  MEDICATIONS:  Current Outpatient Prescriptions  Medication Sig Dispense Refill  . imatinib (GLEEVEC) 400 MG tablet Take 1 tablet (400 mg total) by mouth daily. Take with meals and large glass of water.Caution:Chemotherapy. 90 tablet 3   No current facility-administered medications for this visit.    SURGICAL HISTORY:  Past Surgical History  Procedure Laterality Date  . Refractive surgery  2000  . Tonsilectomy, adenoidectomy, bilateral myringotomy and tubes      age 58  . Toenail excision  01/16/13    REVIEW OF SYSTEMS:  Constitutional: negative Eyes: negative Ears, nose, mouth, throat, and face: negative Respiratory: negative Cardiovascular: negative Gastrointestinal: negative Genitourinary:negative Integument/breast: negative Hematologic/lymphatic: negative Musculoskeletal:negative Neurological: negative Behavioral/Psych: negative Endocrine: negative Allergic/Immunologic: negative   PHYSICAL EXAMINATION: General appearance: alert, cooperative and no distress Head: Normocephalic, without obvious abnormality, atraumatic Neck: no adenopathy, no JVD, supple, symmetrical, trachea midline and thyroid not enlarged, symmetric, no tenderness/mass/nodules Lymph nodes: Cervical, supraclavicular, and axillary nodes normal. Resp: clear to auscultation bilaterally Back: symmetric, no curvature. ROM normal. No CVA tenderness. Cardio: regular rate and rhythm, S1, S2 normal, no murmur, click, rub or gallop GI: soft, non-tender; bowel sounds normal; no masses,  no organomegaly Extremities: extremities normal, atraumatic, no cyanosis or edema Neurologic: Alert and oriented X 3, normal strength and tone. Normal symmetric reflexes. Normal coordination and gait  ECOG PERFORMANCE STATUS: 0 - Asymptomatic  Blood pressure 129/78, pulse 61, temperature 98 F (36.7 C), temperature source  Oral,  resp. rate 19, height 5' 11"  (1.803 m), weight 193 lb (87.544 kg), SpO2 100 %.  LABORATORY DATA: Lab Results  Component Value Date   WBC 3.7* 02/19/2015   HGB 13.3 02/19/2015   HCT 38.2* 02/19/2015   MCV 95.0 02/19/2015   PLT 150 02/19/2015      Chemistry      Component Value Date/Time   NA 140 08/12/2014 1515   NA 143 03/10/2012 0856   K 3.9 08/12/2014 1515   K 4.2 03/10/2012 0856   CL 108* 01/19/2013 1206   CL 108 03/10/2012 0856   CO2 22 08/12/2014 1515   CO2 25 03/10/2012 0856   BUN 17.4 08/12/2014 1515   BUN 16 03/10/2012 0856   CREATININE 1.1 08/12/2014 1515   CREATININE 1.26 03/10/2012 0856   GLU 97 01/17/2015      Component Value Date/Time   CALCIUM 9.0 08/12/2014 1515   CALCIUM 9.2 03/10/2012 0856   ALKPHOS 91 08/12/2014 1515   ALKPHOS 78 03/10/2012 0856   AST 30 08/12/2014 1515   AST 31 03/10/2012 0856   ALT 26 08/12/2014 1515   ALT 28 03/10/2012 0856   BILITOT 0.43 08/12/2014 1515   BILITOT 0.5 03/10/2012 0856       RADIOGRAPHIC STUDIES: No results found.  ASSESSMENT AND PLAN: This is a very pleasant 49 years old white male with history of chronic myeloid leukemia diagnosed inhibitor 2009 and the patient has been on treatment with Gleevec 400 mg by mouth daily since February 2009 and tolerating his treatment fairly well. The previous studies for BCR/ABL were unremarkable for any disease progression. The molecular study performed earlier today are still pending. His CBC showed no significant abnormalities except for mild leukocytopenia. I discussed the lab result with the patient today. I recommended for him to continue on the same treatment with Gleevec 400 mg by mouth daily. The patient is interested in switching to the generic form once he completes his current supply of the medication. I will see him back for follow-up visit in 6 months with repeat CBC, comprehensive metabolic panel, LDH and BCR/ABL. He was advised to call immediately if he has  any concerning symptoms in the interval. The patient voices understanding of current disease status and treatment options and is in agreement with the current care plan.  All questions were answered. The patient knows to call the clinic with any problems, questions or concerns. We can certainly see the patient much sooner if necessary.  I spent 20 minutes counseling the patient face to face. The total time spent in the appointment was 30 minutes.  Disclaimer: This note was dictated with voice recognition software. Similar sounding words can inadvertently be transcribed and may not be corrected upon review.

## 2015-02-20 ENCOUNTER — Telehealth: Payer: Self-pay | Admitting: *Deleted

## 2015-02-20 ENCOUNTER — Telehealth: Payer: Self-pay | Admitting: Medical Oncology

## 2015-02-20 NOTE — Telephone Encounter (Signed)
Pt states he is traveling to [place]. Wants to know if there is any contraindication with him taking shots for travel. In-basket message to Dr Julien Nordmann for advice.

## 2015-02-20 NOTE — Telephone Encounter (Signed)
Per Julien Nordmann there is no problem or contraindication with shots for his out of the country tripp- I left this information on pts voice mail.

## 2015-02-26 ENCOUNTER — Telehealth: Payer: Self-pay | Admitting: *Deleted

## 2015-02-26 MED ORDER — IMATINIB MESYLATE 400 MG PO TABS: 400.0000 mg | ORAL_TABLET | Freq: Every day | ORAL | Status: DC

## 2015-02-26 NOTE — Telephone Encounter (Signed)
Labs reviewed by MD, called notified pt per MD labs are good. Pt requested to have generic of gleevac if available as gleevac is very expensive. Reviewed with MD, ok to refill generic. Pt advised he uses [facility] mail order for medication. RX SENT

## 2015-03-24 ENCOUNTER — Encounter: Payer: Self-pay | Admitting: Internal Medicine

## 2015-08-20 ENCOUNTER — Encounter: Payer: Self-pay | Admitting: Internal Medicine

## 2015-08-20 ENCOUNTER — Telehealth: Payer: Self-pay | Admitting: Internal Medicine

## 2015-08-20 ENCOUNTER — Ambulatory Visit (HOSPITAL_BASED_OUTPATIENT_CLINIC_OR_DEPARTMENT_OTHER): Payer: BLUE CROSS/BLUE SHIELD | Admitting: Internal Medicine

## 2015-08-20 ENCOUNTER — Other Ambulatory Visit (HOSPITAL_BASED_OUTPATIENT_CLINIC_OR_DEPARTMENT_OTHER): Payer: BLUE CROSS/BLUE SHIELD

## 2015-08-20 VITALS — BP 123/78 | HR 63 | Temp 98.4°F | Resp 17 | Ht 71.0 in | Wt 189.5 lb

## 2015-08-20 DIAGNOSIS — D72819 Decreased white blood cell count, unspecified: Secondary | ICD-10-CM | POA: Diagnosis not present

## 2015-08-20 DIAGNOSIS — C9210 Chronic myeloid leukemia, BCR/ABL-positive, not having achieved remission: Secondary | ICD-10-CM

## 2015-08-20 LAB — CBC WITH DIFFERENTIAL/PLATELET
BASO%: 1.6 % (ref 0.0–2.0)
BASOS ABS: 0.1 10*3/uL (ref 0.0–0.1)
EOS%: 1.7 % (ref 0.0–7.0)
Eosinophils Absolute: 0.1 10*3/uL (ref 0.0–0.5)
HEMATOCRIT: 39.1 % (ref 38.4–49.9)
HGB: 13.2 g/dL (ref 13.0–17.1)
LYMPH#: 1.2 10*3/uL (ref 0.9–3.3)
LYMPH%: 31.2 % (ref 14.0–49.0)
MCH: 32.4 pg (ref 27.2–33.4)
MCHC: 33.8 g/dL (ref 32.0–36.0)
MCV: 95.8 fL (ref 79.3–98.0)
MONO#: 0.3 10*3/uL (ref 0.1–0.9)
MONO%: 7.8 % (ref 0.0–14.0)
NEUT#: 2.2 10*3/uL (ref 1.5–6.5)
NEUT%: 57.7 % (ref 39.0–75.0)
Platelets: 155 10*3/uL (ref 140–400)
RBC: 4.08 10*6/uL — AB (ref 4.20–5.82)
RDW: 13.1 % (ref 11.0–14.6)
WBC: 3.8 10*3/uL — ABNORMAL LOW (ref 4.0–10.3)

## 2015-08-20 LAB — COMPREHENSIVE METABOLIC PANEL (CC13)
ALT: 28 U/L (ref 0–55)
AST: 31 U/L (ref 5–34)
Albumin: 4.2 g/dL (ref 3.5–5.0)
Alkaline Phosphatase: 92 U/L (ref 40–150)
Anion Gap: 8 mEq/L (ref 3–11)
BUN: 19.5 mg/dL (ref 7.0–26.0)
CHLORIDE: 111 meq/L — AB (ref 98–109)
CO2: 24 mEq/L (ref 22–29)
CREATININE: 1.1 mg/dL (ref 0.7–1.3)
Calcium: 9 mg/dL (ref 8.4–10.4)
EGFR: 76 mL/min/{1.73_m2} — ABNORMAL LOW (ref >90)
GLUCOSE: 86 mg/dL (ref 70–140)
POTASSIUM: 4 meq/L (ref 3.5–5.1)
SODIUM: 142 meq/L (ref 136–145)
Total Bilirubin: 0.5 mg/dL (ref 0.20–1.20)
Total Protein: 6.8 g/dL (ref 6.4–8.3)

## 2015-08-20 LAB — LACTATE DEHYDROGENASE (CC13): LDH: 218 U/L (ref 125–245)

## 2015-08-20 NOTE — Telephone Encounter (Signed)
Gave adn printed appt sched anda vs for pt for Feb °

## 2015-08-20 NOTE — Progress Notes (Signed)
[facility] [phone]   Fax:(336) [phone]  OFFICE PROGRESS NOTE  Michael Cowden, MD [place] [address]  DIAGNOSIS:  1) Chronic myeloid leukemia diagnosed in February 2009.  PRIOR THERAPY: None  CURRENT THERAPY: Gleevec 400 mg by mouth daily started February 2009  INTERVAL HISTORY: Michael [name] 49 y.o. male returns to the clinic today for six-month follow-up visit. The patient is feeling fine today with no specific complaints. His last molecular study performed in 6 months ago showed no evidence for disease progression. He is tolerating his treatment with Gleevec fairly well with no significant adverse effects. The patient denied having any significant weight loss or night sweats. He denied having any chest pain, shortness of breath, cough or hemoptysis. He has no nausea or vomiting, no fever or chills. He has repeat CBC, comprehensive metabolic panel and molecular study for BCR/ABL performed earlier today and he is here for evaluation and discussion of his lab results.   MEDICAL HISTORY: Past Medical History  Diagnosis Date  . 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 Leukocytosis, unspecified   . Splenomegaly     resolved CT Sep2013  . Anal fistula   . Liver cyst     Left hepatic dome 2 cm cyst, CT Sep2013  . Pruritus ani, intermittent 01/17/2013    ALLERGIES:  has No Known Allergies.  MEDICATIONS:  Current Outpatient Prescriptions  Medication Sig Dispense Refill  . imatinib (GLEEVEC) 400 MG tablet Take 1 tablet (400 mg total) by mouth daily. Take with meals and large glass of water.Caution:Chemotherapy. 90 tablet 1   No current facility-administered medications for this visit.    SURGICAL HISTORY:  Past Surgical History  Procedure Laterality Date  . Refractive surgery  2000  . Tonsilectomy, adenoidectomy, bilateral myringotomy and tubes      age 73  . Toenail  excision  01/16/13    REVIEW OF SYSTEMS:  Constitutional: negative Eyes: negative Ears, nose, mouth, throat, and face: negative Respiratory: negative Cardiovascular: negative Gastrointestinal: negative Genitourinary:negative Integument/breast: negative Hematologic/lymphatic: negative Musculoskeletal:negative Neurological: negative Behavioral/Psych: negative Endocrine: negative Allergic/Immunologic: negative   PHYSICAL EXAMINATION: General appearance: alert, cooperative and no distress Head: Normocephalic, without obvious abnormality, atraumatic Neck: no adenopathy, no JVD, supple, symmetrical, trachea midline and thyroid not enlarged, symmetric, no tenderness/mass/nodules Lymph nodes: Cervical, supraclavicular, and axillary nodes normal. Resp: clear to auscultation bilaterally Back: symmetric, no curvature. ROM normal. No CVA tenderness. Cardio: regular rate and rhythm, S1, S2 normal, no murmur, click, rub or gallop GI: soft, non-tender; bowel sounds normal; no masses,  no organomegaly Extremities: extremities normal, atraumatic, no cyanosis or edema Neurologic: Alert and oriented X 3, normal strength and tone. Normal symmetric reflexes. Normal coordination and gait  ECOG PERFORMANCE STATUS: 0 - Asymptomatic  Blood pressure 123/78, pulse 63, temperature 98.4 F (36.9 C), temperature source Oral, resp. rate 17, height 5\' 11"  (1.803 m), weight 189 lb 8 oz (85.957 kg), SpO2 100 %.  LABORATORY DATA: Lab Results  Component Value Date   WBC 3.8* 08/20/2015   HGB 13.2 08/20/2015   HCT 39.1 08/20/2015   MCV 95.8 08/20/2015   PLT 155 08/20/2015      Chemistry      Component Value Date/Time   NA 142 08/20/2015 1504   NA 143 03/10/2012 0856   K 4.0 08/20/2015 1504   K 4.2 03/10/2012 0856   CL 108* 01/19/2013 1206   CL 108 03/10/2012 0856  CO2 24 08/20/2015 1504   CO2 25 03/10/2012 0856   BUN 19.5 08/20/2015 1504   BUN 16 03/10/2012 0856   CREATININE 1.1 08/20/2015 1504     CREATININE 1.26 03/10/2012 0856   GLU 97 01/17/2015      Component Value Date/Time   CALCIUM 9.0 08/20/2015 1504   CALCIUM 9.2 03/10/2012 0856   ALKPHOS 92 08/20/2015 1504   ALKPHOS 78 03/10/2012 0856   AST 31 08/20/2015 1504   AST 31 03/10/2012 0856   ALT 28 08/20/2015 1504   ALT 28 03/10/2012 0856   BILITOT 0.50 08/20/2015 1504   BILITOT 0.5 03/10/2012 0856       RADIOGRAPHIC STUDIES: No results found.  ASSESSMENT AND PLAN: This is a very pleasant 49 years old white male with history of chronic myeloid leukemia diagnosed inhibitor 2009 and the patient has been on treatment with Gleevec 400 mg by mouth daily since February 2009 and tolerating his treatment fairly well. The previous studies for BCR/ABL were unremarkable for any disease progression. The molecular study performed earlier today are still pending. CBC today showed persistent mild leukocytopenia. I discussed the lab result with the patient today. I recommended for him to continue on the same treatment with Gleevec 400 mg by mouth daily. The patient is interested in switching to the generic form once he completes his current supply of the medication. I will see him back for follow-up visit in 6 months with repeat CBC, comprehensive metabolic panel, LDH and BCR/ABL. He was advised to call immediately if he has any concerning symptoms in the interval. The patient voices understanding of current disease status and treatment options and is in agreement with the current care plan.  All questions were answered. The patient knows to call the clinic with any problems, questions or concerns. We can certainly see the patient much sooner if necessary.  Disclaimer: This note was dictated with voice recognition software. Similar sounding words can inadvertently be transcribed and may not be corrected upon review.

## 2015-09-05 ENCOUNTER — Other Ambulatory Visit: Payer: Self-pay | Admitting: Medical Oncology

## 2015-09-05 DIAGNOSIS — C9210 Chronic myeloid leukemia, BCR/ABL-positive, not having achieved remission: Secondary | ICD-10-CM

## 2015-09-05 MED ORDER — IMATINIB MESYLATE 400 MG PO TABS: 400.0000 mg | ORAL_TABLET | Freq: Every day | ORAL | Status: DC

## 2015-09-05 NOTE — Progress Notes (Signed)
[facility] Faxed to [facility] too.

## 2015-11-07 ENCOUNTER — Telehealth: Payer: Self-pay | Admitting: Internal Medicine

## 2015-11-07 NOTE — Telephone Encounter (Signed)
error 

## 2016-01-07 ENCOUNTER — Other Ambulatory Visit (INDEPENDENT_AMBULATORY_CARE_PROVIDER_SITE_OTHER): Payer: BLUE CROSS/BLUE SHIELD

## 2016-01-07 DIAGNOSIS — R7989 Other specified abnormal findings of blood chemistry: Secondary | ICD-10-CM | POA: Diagnosis not present

## 2016-01-07 DIAGNOSIS — Z0000 Encounter for general adult medical examination without abnormal findings: Secondary | ICD-10-CM

## 2016-01-07 LAB — POCT URINALYSIS DIPSTICK
Bilirubin, UA: NEGATIVE
Blood, UA: NEGATIVE
GLUCOSE UA: NEGATIVE
KETONES UA: NEGATIVE
LEUKOCYTES UA: NEGATIVE
Nitrite, UA: NEGATIVE
PROTEIN UA: NEGATIVE
Spec Grav, UA: 1.025
UROBILINOGEN UA: 0.2
pH, UA: 5.5

## 2016-01-07 LAB — CBC WITH DIFFERENTIAL/PLATELET
BASOS ABS: 0 10*3/uL (ref 0.0–0.1)
Basophils Relative: 0.9 % (ref 0.0–3.0)
EOS ABS: 0 10*3/uL (ref 0.0–0.7)
Eosinophils Relative: 1.2 % (ref 0.0–5.0)
HEMATOCRIT: 39.3 % (ref 39.0–52.0)
HEMOGLOBIN: 13.4 g/dL (ref 13.0–17.0)
LYMPHS PCT: 35.6 % (ref 12.0–46.0)
Lymphs Abs: 1.1 10*3/uL (ref 0.7–4.0)
MCHC: 34.2 g/dL (ref 30.0–36.0)
MCV: 97.3 fl (ref 78.0–100.0)
MONOS PCT: 7.8 % (ref 3.0–12.0)
Monocytes Absolute: 0.2 10*3/uL (ref 0.1–1.0)
NEUTROS ABS: 1.6 10*3/uL (ref 1.4–7.7)
Neutrophils Relative %: 54.5 % (ref 43.0–77.0)
PLATELETS: 148 10*3/uL — AB (ref 150.0–400.0)
RBC: 4.04 Mil/uL — AB (ref 4.22–5.81)
RDW: 13.2 % (ref 11.5–15.5)
WBC: 3 10*3/uL — AB (ref 4.0–10.5)

## 2016-01-07 LAB — LDL CHOLESTEROL, DIRECT: Direct LDL: 48 mg/dL

## 2016-01-07 LAB — BASIC METABOLIC PANEL
BUN: 16 mg/dL (ref 6–23)
CO2: 28 meq/L (ref 19–32)
Calcium: 8.9 mg/dL (ref 8.4–10.5)
Chloride: 106 mEq/L (ref 96–112)
Creatinine, Ser: 1.26 mg/dL (ref 0.40–1.50)
GFR: 64.19 mL/min (ref >60.00)
GLUCOSE: 96 mg/dL (ref 70–99)
POTASSIUM: 4.7 meq/L (ref 3.5–5.1)
Sodium: 140 mEq/L (ref 135–145)

## 2016-01-07 LAB — PSA: PSA: 1.89 ng/mL (ref 0.10–4.00)

## 2016-01-07 LAB — TSH: TSH: 1.39 u[IU]/mL (ref 0.35–4.50)

## 2016-01-07 LAB — LIPID PANEL
CHOL/HDL RATIO: 8
CHOLESTEROL: 210 mg/dL — AB (ref 0–200)
HDL: 25.4 mg/dL — AB (ref >39.00)
Triglycerides: 751 mg/dL — ABNORMAL HIGH (ref 0.0–149.0)

## 2016-01-07 LAB — HEPATIC FUNCTION PANEL
ALK PHOS: 66 U/L (ref 39–117)
ALT: 23 U/L (ref 0–53)
AST: 28 U/L (ref 0–37)
Albumin: 4.2 g/dL (ref 3.5–5.2)
BILIRUBIN DIRECT: 0.1 mg/dL (ref 0.0–0.3)
TOTAL PROTEIN: 6.3 g/dL (ref 6.0–8.3)
Total Bilirubin: 0.7 mg/dL (ref 0.2–1.2)

## 2016-01-13 ENCOUNTER — Ambulatory Visit (INDEPENDENT_AMBULATORY_CARE_PROVIDER_SITE_OTHER): Payer: BLUE CROSS/BLUE SHIELD | Admitting: Internal Medicine

## 2016-01-13 ENCOUNTER — Encounter: Payer: Self-pay | Admitting: Internal Medicine

## 2016-01-13 VITALS — BP 130/88 | HR 54 | Temp 98.4°F | Resp 20 | Ht 70.75 in | Wt 195.0 lb

## 2016-01-13 DIAGNOSIS — Z0000 Encounter for general adult medical examination without abnormal findings: Secondary | ICD-10-CM | POA: Diagnosis not present

## 2016-01-13 NOTE — Patient Instructions (Signed)
Health Maintenance, Male A healthy lifestyle and preventative care can promote health and wellness.  Maintain regular health, dental, and eye exams.  Eat a healthy diet. Foods like vegetables, fruits, whole grains, low-fat dairy products, and lean protein foods contain the nutrients you need and are low in calories. Decrease your intake of foods high in solid fats, added sugars, and salt. Get information about a proper diet from your health care provider, if necessary.  Regular physical exercise is one of the most important things you can do for your health. Most adults should get at least 150 minutes of moderate-intensity exercise (any activity that increases your heart rate and causes you to sweat) each week. In addition, most adults need muscle-strengthening exercises on 2 or more days a week.   Maintain a healthy weight. The body mass index (BMI) is a screening tool to identify possible weight problems. It provides an estimate of body fat based on height and weight. Your health care provider can find your BMI and can help you achieve or maintain a healthy weight. For males 20 years and older:  A BMI below 18.5 is considered underweight.  A BMI of 18.5 to 24.9 is normal.  A BMI of 25 to 29.9 is considered overweight.  A BMI of 30 and above is considered obese.  Maintain normal blood lipids and cholesterol by exercising and minimizing your intake of saturated fat. Eat a balanced diet with plenty of fruits and vegetables. Blood tests for lipids and cholesterol should begin at age 20 and be repeated every 5 years. If your lipid or cholesterol levels are high, you are over age 50, or you are at high risk for heart disease, you may need your cholesterol levels checked more frequently.Ongoing high lipid and cholesterol levels should be treated with medicines if diet and exercise are not working.  If you smoke, find out from your health care provider how to quit. If you do not use tobacco, do not  start.  Lung cancer screening is recommended for adults aged 55-80 years who are at high risk for developing lung cancer because of a history of smoking. A yearly low-dose CT scan of the lungs is recommended for people who have at least a 30-pack-year history of smoking and are current smokers or have quit within the past 15 years. A pack year of smoking is smoking an average of 1 pack of cigarettes a day for 1 year (for example, a 30-pack-year history of smoking could mean smoking 1 pack a day for 30 years or 2 packs a day for 15 years). Yearly screening should continue until the smoker has stopped smoking for at least 15 years. Yearly screening should be stopped for people who develop a health problem that would prevent them from having lung cancer treatment.  If you choose to drink alcohol, do not have more than 2 drinks per day. One drink is considered to be 12 oz (360 mL) of beer, 5 oz (150 mL) of wine, or 1.5 oz (45 mL) of liquor.  Avoid the use of street drugs. Do not share needles with anyone. Ask for help if you need support or instructions about stopping the use of drugs.  High blood pressure causes heart disease and increases the risk of stroke. High blood pressure is more likely to develop in:  People who have blood pressure in the end of the normal range (100-139/85-89 mm Hg).  People who are overweight or obese.  People who are African American.    If you are 18-39 years of age, have your blood pressure checked every 3-5 years. If you are 40 years of age or older, have your blood pressure checked every year. You should have your blood pressure measured twice--once when you are at a hospital or clinic, and once when you are not at a hospital or clinic. Record the average of the two measurements. To check your blood pressure when you are not at a hospital or clinic, you can use:  An automated blood pressure machine at a pharmacy.  A home blood pressure monitor.  If you are 45-79 years  old, ask your health care provider if you should take aspirin to prevent heart disease.  Diabetes screening involves taking a blood sample to check your fasting blood sugar level. This should be done once every 3 years after age 45 if you are at a normal weight and without risk factors for diabetes. Testing should be considered at a younger age or be carried out more frequently if you are overweight and have at least 1 risk factor for diabetes.  Colorectal cancer can be detected and often prevented. Most routine colorectal cancer screening begins at the age of 50 and continues through age 75. However, your health care provider may recommend screening at an earlier age if you have risk factors for colon cancer. On a yearly basis, your health care provider may provide home test kits to check for hidden blood in the stool. A small camera at the end of a tube may be used to directly examine the colon (sigmoidoscopy or colonoscopy) to detect the earliest forms of colorectal cancer. Talk to your health care provider about this at age 50 when routine screening begins. A direct exam of the colon should be repeated every 5-10 years through age 75, unless early forms of precancerous polyps or small growths are found.  People who are at an increased risk for hepatitis B should be screened for this virus. You are considered at high risk for hepatitis B if:  You were born in a country where hepatitis B occurs often. Talk with your health care provider about which countries are considered high risk.  Your parents were born in a high-risk country and you have not received a shot to protect against hepatitis B (hepatitis B vaccine).  You have HIV or AIDS.  You use needles to inject street drugs.  You live with, or have sex with, someone who has hepatitis B.  You are a man who has sex with other men (MSM).  You get hemodialysis treatment.  You take certain medicines for conditions like cancer, organ  transplantation, and autoimmune conditions.  Hepatitis C blood testing is recommended for all people born from 1945 through 1965 and any individual with known risk factors for hepatitis C.  Healthy men should no longer receive prostate-specific antigen (PSA) blood tests as part of routine cancer screening. Talk to your health care provider about prostate cancer screening.  Testicular cancer screening is not recommended for adolescents or adult males who have no symptoms. Screening includes self-exam, a health care provider exam, and other screening tests. Consult with your health care provider about any symptoms you have or any concerns you have about testicular cancer.  Practice safe sex. Use condoms and avoid high-risk sexual practices to reduce the spread of sexually transmitted infections (STIs).  You should be screened for STIs, including gonorrhea and chlamydia if:  You are sexually active and are younger than 24 years.  You   are older than 24 years, and your health care provider tells you that you are at risk for this type of infection.  Your sexual activity has changed since you were last screened, and you are at an increased risk for chlamydia or gonorrhea. Ask your health care provider if you are at risk.  If you are at risk of being infected with HIV, it is recommended that you take a prescription medicine daily to prevent HIV infection. This is called pre-exposure prophylaxis (PrEP). You are considered at risk if:  You are a man who has sex with other men (MSM).  You are a heterosexual man who is sexually active with multiple partners.  You take drugs by injection.  You are sexually active with a partner who has HIV.  Talk with your health care provider about whether you are at high risk of being infected with HIV. If you choose to begin PrEP, you should first be tested for HIV. You should then be tested every 3 months for as long as you are taking PrEP.  Use sunscreen. Apply  sunscreen liberally and repeatedly throughout the day. You should seek shade when your shadow is shorter than you. Protect yourself by wearing long sleeves, pants, a wide-brimmed hat, and sunglasses year round whenever you are outdoors.  Tell your health care provider of new moles or changes in moles, especially if there is a change in shape or color. Also, tell your health care provider if a mole is larger than the size of a pencil eraser.  A one-time screening for abdominal aortic aneurysm (AAA) and surgical repair of large AAAs by ultrasound is recommended for men aged 55-75 years who are current or former smokers.  Stay current with your vaccines (immunizations).   This information is not intended to replace advice given to you by your health care provider. Make sure you discuss any questions you have with your health care provider.   Document Released: 06/03/2008 Document Revised: 12/27/2014 Document Reviewed: 05/03/2011 Elsevier Interactive Patient Education 2016 [place] Choices to Lower Your Triglycerides Triglycerides are a type of fat in your blood. High levels of triglycerides can increase the risk of heart disease and stroke. If your triglyceride levels are high, the foods you eat and your eating habits are very important. Choosing the right foods can help lower your triglycerides.  WHAT GENERAL GUIDELINES DO I NEED TO FOLLOW?  Lose weight if you are overweight.   Limit or avoid alcohol.   Fill one half of your plate with vegetables and green salads.   Limit fruit to two servings a day. Choose fruit instead of juice.   Make one fourth of your plate whole grains. Look for the word "whole" as the first word in the ingredient list.  Fill one fourth of your plate with lean protein foods.  Enjoy fatty fish (such as salmon, mackerel, sardines, and tuna) three times a week.   Choose healthy fats.   Limit foods high in starch and sugar.  Eat more home-cooked  food and less restaurant, buffet, and fast food.  Limit fried foods.  Cook foods using methods other than frying.  Limit saturated fats.  Check ingredient lists to avoid foods with partially hydrogenated oils (trans fats) in them. WHAT FOODS CAN I EAT?  Grains Whole grains, such as whole wheat or whole grain breads, crackers, cereals, and pasta. Unsweetened oatmeal, bulgur, barley, quinoa, or brown rice. Corn or whole wheat flour tortillas.  Vegetables Fresh or frozen vegetables (  raw, steamed, roasted, or grilled). Green salads. Fruits All fresh, canned (in natural juice), or frozen fruits. Meat and Other Protein Products Ground beef (85% or leaner), grass-fed beef, or beef trimmed of fat. Skinless chicken or [place]. Ground chicken or [place]. Pork trimmed of fat. All fish and seafood. Eggs. Dried beans, peas, or lentils. Unsalted nuts or seeds. Unsalted canned or dry beans. Dairy Low-fat dairy products, such as skim or 1% milk, 2% or reduced-fat cheeses, low-fat ricotta or cottage cheese, or plain low-fat yogurt. Fats and Oils Tub margarines without trans fats. Light or reduced-fat mayonnaise and salad dressings. Avocado. Safflower, olive, or canola oils. Natural peanut or almond butter. The items listed above may not be a complete list of recommended foods or beverages. Contact your dietitian for more options. WHAT FOODS ARE NOT RECOMMENDED?  Grains White bread. White pasta. White rice. Cornbread. Bagels, pastries, and croissants. Crackers that contain trans fat. Vegetables White potatoes. Corn. Creamed or fried vegetables. Vegetables in a cheese sauce. Fruits Dried fruits. Canned fruit in light or heavy syrup. Fruit juice. Meat and Other Protein Products Fatty cuts of meat. Ribs, chicken wings, bacon, sausage, bologna, salami, chitterlings, fatback, hot dogs, bratwurst, and packaged luncheon meats. Dairy Whole or 2% milk, cream, half-and-half, and cream cheese. Whole-fat or  sweetened yogurt. Full-fat cheeses. Nondairy creamers and whipped toppings. Processed cheese, cheese spreads, or cheese curds. Sweets and Desserts Corn syrup, sugars, honey, and molasses. Candy. Jam and jelly. Syrup. Sweetened cereals. Cookies, pies, cakes, donuts, muffins, and ice cream. Fats and Oils Butter, stick margarine, lard, shortening, ghee, or bacon fat. Coconut, palm kernel, or palm oils. Beverages Alcohol. Sweetened drinks (such as sodas, lemonade, and fruit drinks or punches). The items listed above may not be a complete list of foods and beverages to avoid. Contact your dietitian for more information.   This information is not intended to replace advice given to you by your health care provider. Make sure you discuss any questions you have with your health care provider.   Document Released: 09/23/2004 Document Revised: 12/27/2014 Document Reviewed: 10/10/2013 Elsevier Interactive Patient Education [employer].

## 2016-01-13 NOTE — Progress Notes (Signed)
Pre visit review using our clinic review tool, if applicable. No additional management support is needed unless otherwise documented below in the visit note. 

## 2016-01-13 NOTE — Progress Notes (Signed)
Subjective:    Patient ID: Michael [name], male    DOB: 1966/05/26, 50 y.o.   MRN: [id number]  HPI    Subjective:    Patient ID: Michael [name], male    DOB: 10-23-1966, 50 y.o.   MRN: [id number]  HPI 50 year-old patient who is seen today for a preventive health examination.  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  Last year he noticed some left arm weakness, but this has resolved over the past year.  He remains quite active with golf.  No new concerns or complaints  Family history father age 72 Mother died age 49 complications of coronary artery disease One brother, 2 sisters in good health  Past Medical History  Diagnosis Date  . 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 Leukocytosis, unspecified   . Splenomegaly     resolved CT Sep2013  . Anal fistula   . Liver cyst     Left hepatic dome 2 cm cyst, CT Sep2013  . Pruritus ani, intermittent 01/17/2013    Social History   Social History  . Marital Status: Married    Spouse Name: N/A  . Number of Children: N/A  . Years of Education: N/A   Occupational History  . Not on file.   Social History Main Topics  . Smoking status: Never Smoker   . Smokeless tobacco: Never Used  . Alcohol Use: No  . Drug Use: No  . Sexual Activity: Not on file   Other Topics Concern  . Not on file   Social History Narrative    Past Surgical History  Procedure Laterality Date  . Refractive surgery  2000  . Tonsilectomy, adenoidectomy, bilateral myringotomy and tubes      age 50  . Toenail excision  01/16/13    Family History  Problem Relation Age of Onset  . Cancer Father 51    prostate  . Heart disease Mother     No Known Allergies  Current Outpatient Prescriptions on File Prior to Visit  Medication Sig Dispense Refill  . imatinib (GLEEVEC) 400 MG tablet Take 1 tablet (400 mg total) by mouth daily. Take with meals and  large glass of water.Caution:Chemotherapy. 90 tablet 2   No current facility-administered medications on file prior to visit.    BP 130/88 mmHg  Pulse 54  Temp(Src) 98.4 F (36.9 C) (Oral)  Resp 20  Ht 5' 10.75" (1.797 m)  Wt 195 lb (88.451 kg)  BMI 27.39 kg/m2  SpO2 99%      Review of Systems  Constitutional: Negative for fever, chills, activity change, appetite change and fatigue.  HENT: Negative for congestion, dental problem, ear pain, hearing loss, mouth sores, rhinorrhea, sinus pressure, sneezing, tinnitus, trouble swallowing and voice change.   Eyes: Negative for photophobia, pain, redness and visual disturbance.  Respiratory: Negative for apnea, cough, choking, chest tightness, shortness of breath and wheezing.   Cardiovascular: Negative for chest pain, palpitations and leg swelling.  Gastrointestinal: Negative for nausea, vomiting, abdominal pain, diarrhea, constipation, blood in stool, abdominal distention, anal bleeding and rectal pain.  Genitourinary: Negative for dysuria, urgency, frequency, hematuria, flank pain, decreased urine volume, discharge, penile swelling, scrotal swelling, difficulty urinating, genital sores and testicular pain.  Musculoskeletal: Negative for myalgias, back pain, joint swelling, arthralgias, gait problem, neck pain and neck stiffness.  Skin: Negative for color change, rash and wound.  Neurological  Negative for dizziness, tremors, seizures, syncope, facial asymmetry, speech difficulty, light-headedness, numbness and headaches.  Hematological: Negative for adenopathy. Does not bruise/bleed easily.  Psychiatric/Behavioral: Negative for suicidal ideas, hallucinations, behavioral problems, confusion, sleep disturbance, self-injury, dysphoric mood, decreased concentration and agitation. The patient is not nervous/anxious.        Objective:   Physical Exam  Constitutional: He appears well-developed and well-nourished.  HENT:  Head:  Normocephalic and atraumatic.  Right Ear: External ear normal.  Left Ear: External ear normal.  Nose: Nose normal.  Mouth/Throat: Oropharynx is clear and moist.  Eyes: Conjunctivae and EOM are normal. Pupils are equal, round, and reactive to light. No scleral icterus.  Neck: Normal range of motion. Neck supple. No JVD present. No thyromegaly present.  Cardiovascular: Regular rhythm, normal heart sounds and intact distal pulses.  Exam reveals no gallop and no friction rub.   No murmur heard. Pulmonary/Chest: Effort normal and breath sounds normal. He exhibits no tenderness.  Abdominal: Soft. Bowel sounds are normal. He exhibits no distension and no mass. There is no tenderness.  Genitourinary: Penis normal.  Musculoskeletal: Normal range of motion. He exhibits no edema or tenderness.  Lymphadenopathy:    He has no cervical adenopathy.  Neurological: He is alert. He has normal reflexes. No cranial nerve deficit. Coordination normal.   normal.  Motor exam Skin: Skin is warm and dry. No rash noted.  Psychiatric: He has a normal mood and affect. His behavior is normal.          Assessment & Plan:   Preventive health examination CML.  In remission.  Follow-up oncology History of Cervical radiculopathy.  Left arm weakness has resolved   Review of Systems     as above  Objective:   Physical Exam  As above     Assessment & Plan:   Preventive health examination.  Screening laboratory data reviewed We'll schedule screening colonoscopy Hypertriglyceridemia dietary factors discussed and information provided.  We'll continue vigorous exercise program.  Patient will consider niacin or omega-3 fatty acids

## 2016-02-18 ENCOUNTER — Telehealth: Payer: Self-pay | Admitting: Internal Medicine

## 2016-02-18 ENCOUNTER — Other Ambulatory Visit (HOSPITAL_BASED_OUTPATIENT_CLINIC_OR_DEPARTMENT_OTHER): Payer: BLUE CROSS/BLUE SHIELD

## 2016-02-18 ENCOUNTER — Encounter: Payer: Self-pay | Admitting: Internal Medicine

## 2016-02-18 ENCOUNTER — Ambulatory Visit (HOSPITAL_BASED_OUTPATIENT_CLINIC_OR_DEPARTMENT_OTHER): Payer: BLUE CROSS/BLUE SHIELD | Admitting: Internal Medicine

## 2016-02-18 VITALS — BP 124/79 | HR 56 | Temp 98.1°F | Resp 18 | Ht 70.75 in | Wt 188.5 lb

## 2016-02-18 DIAGNOSIS — C9210 Chronic myeloid leukemia, BCR/ABL-positive, not having achieved remission: Secondary | ICD-10-CM

## 2016-02-18 DIAGNOSIS — C9110 Chronic lymphocytic leukemia of B-cell type not having achieved remission: Secondary | ICD-10-CM

## 2016-02-18 LAB — COMPREHENSIVE METABOLIC PANEL
ALBUMIN: 4.3 g/dL (ref 3.5–5.0)
ALK PHOS: 81 U/L (ref 40–150)
ALT: 29 U/L (ref 0–55)
ANION GAP: 7 meq/L (ref 3–11)
AST: 36 U/L — ABNORMAL HIGH (ref 5–34)
BILIRUBIN TOTAL: 0.53 mg/dL (ref 0.20–1.20)
BUN: 18.4 mg/dL (ref 7.0–26.0)
CO2: 25 mEq/L (ref 22–29)
Calcium: 8.8 mg/dL (ref 8.4–10.4)
Chloride: 109 mEq/L (ref 98–109)
Creatinine: 1.3 mg/dL (ref 0.7–1.3)
EGFR: 64 mL/min/{1.73_m2} — AB (ref >90)
Glucose: 91 mg/dl (ref 70–140)
Potassium: 4.3 mEq/L (ref 3.5–5.1)
Sodium: 141 mEq/L (ref 136–145)
TOTAL PROTEIN: 7 g/dL (ref 6.4–8.3)

## 2016-02-18 LAB — CBC WITH DIFFERENTIAL/PLATELET
BASO%: 0.9 % (ref 0.0–2.0)
BASOS ABS: 0 10*3/uL (ref 0.0–0.1)
EOS ABS: 0 10*3/uL (ref 0.0–0.5)
EOS%: 0.7 % (ref 0.0–7.0)
HEMATOCRIT: 38.9 % (ref 38.4–49.9)
HEMOGLOBIN: 13.1 g/dL (ref 13.0–17.1)
LYMPH#: 1.3 10*3/uL (ref 0.9–3.3)
LYMPH%: 32.2 % (ref 14.0–49.0)
MCH: 32.6 pg (ref 27.2–33.4)
MCHC: 33.7 g/dL (ref 32.0–36.0)
MCV: 96.7 fL (ref 79.3–98.0)
MONO#: 0.3 10*3/uL (ref 0.1–0.9)
MONO%: 7.4 % (ref 0.0–14.0)
NEUT%: 58.8 % (ref 39.0–75.0)
NEUTROS ABS: 2.4 10*3/uL (ref 1.5–6.5)
PLATELETS: 152 10*3/uL (ref 140–400)
RBC: 4.02 10*6/uL — ABNORMAL LOW (ref 4.20–5.82)
RDW: 12.8 % (ref 11.0–14.6)
WBC: 4 10*3/uL (ref 4.0–10.3)

## 2016-02-18 LAB — LACTATE DEHYDROGENASE: LDH: 228 U/L (ref 125–245)

## 2016-02-18 NOTE — Progress Notes (Signed)
[facility] [phone]   [phone]  OFFICE PROGRESS NOTE  [name], MD [place] [address]  DIAGNOSIS:  1) Chronic myeloid leukemia diagnosed in February 2009.  PRIOR THERAPY: None  CURRENT THERAPY: Gleevec 400 mg by mouth daily started February 2009  INTERVAL HISTORY: Michael [name] 50 y.o. male returns to the clinic today for six-month follow-up visit. The patient is feeling fine today with no specific complaints. He is tolerating his treatment with Gleevec fairly well with no significant adverse effects. The patient denied having any significant weight loss or night sweats. He denied having any chest pain, shortness of breath, cough or hemoptysis. He has no nausea or vomiting, no fever or chills. He has repeat CBC, comprehensive metabolic panel and molecular study for BCR/ABL performed earlier today and he is here for evaluation and discussion of his lab results.   MEDICAL HISTORY: Past Medical History  Diagnosis Date  . 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 Leukocytosis, unspecified   . Splenomegaly     resolved CT Sep2013  . Anal fistula   . Liver cyst     Left hepatic dome 2 cm cyst, CT Sep2013  . Pruritus ani, intermittent 01/17/2013    ALLERGIES:  has No Known Allergies.  MEDICATIONS:  Current Outpatient Prescriptions  Medication Sig Dispense Refill  . imatinib (GLEEVEC) 400 MG tablet Take 1 tablet (400 mg total) by mouth daily. Take with meals and large glass of water.Caution:Chemotherapy. 90 tablet 2   No current facility-administered medications for this visit.    SURGICAL HISTORY:  Past Surgical History  Procedure Laterality Date  . Refractive surgery  2000  . Tonsilectomy, adenoidectomy, bilateral myringotomy and tubes      age 28  . Toenail excision  01/16/13    REVIEW OF SYSTEMS:  A comprehensive review of systems was negative.    PHYSICAL EXAMINATION: General appearance: alert, cooperative and no distress Head: Normocephalic, without obvious abnormality, atraumatic Neck: no adenopathy, no JVD, supple, symmetrical, trachea midline and thyroid not enlarged, symmetric, no tenderness/mass/nodules Lymph nodes: Cervical, supraclavicular, and axillary nodes normal. Resp: clear to auscultation bilaterally Back: symmetric, no curvature. ROM normal. No CVA tenderness. Cardio: regular rate and rhythm, S1, S2 normal, no murmur, click, rub or gallop GI: soft, non-tender; bowel sounds normal; no masses,  no organomegaly Extremities: extremities normal, atraumatic, no cyanosis or edema Neurologic: Alert and oriented X 3, normal strength and tone. Normal symmetric reflexes. Normal coordination and gait  ECOG PERFORMANCE STATUS: 0 - Asymptomatic  Blood pressure 124/79, pulse 56, temperature 98.1 F (36.7 C), temperature source Oral, resp. rate 18, height 5' 10.75" (1.797 m), weight 188 lb 8 oz (85.503 kg), SpO2 100 %.  LABORATORY DATA: Lab Results  Component Value Date   WBC 4.0 02/18/2016   HGB 13.1 02/18/2016   HCT 38.9 02/18/2016   MCV 96.7 02/18/2016   PLT 152 02/18/2016      Chemistry      Component Value Date/Time   NA 140 01/07/2016 0816   NA 142 08/20/2015 1504   K 4.7 01/07/2016 0816   K 4.0 08/20/2015 1504   CL 106 01/07/2016 0816   CL 108* 01/19/2013 1206   CO2 28 01/07/2016 0816   CO2 24 08/20/2015 1504   BUN 16 01/07/2016 0816   BUN 19.5 08/20/2015 1504   CREATININE 1.26 01/07/2016 0816   CREATININE 1.1 08/20/2015 1504   GLU  97 01/17/2015      Component Value Date/Time   CALCIUM 8.9 01/07/2016 0816   CALCIUM 9.0 08/20/2015 1504   ALKPHOS 66 01/07/2016 0816   ALKPHOS 92 08/20/2015 1504   AST 28 01/07/2016 0816   AST 31 08/20/2015 1504   ALT 23 01/07/2016 0816   ALT 28 08/20/2015 1504   BILITOT 0.7 01/07/2016 0816   BILITOT 0.50 08/20/2015 1504       RADIOGRAPHIC STUDIES: No results  found.  ASSESSMENT AND PLAN: This is a very pleasant 50 years old white male with history of chronic myeloid leukemia diagnosed inhibitor 2009 and the patient has been on treatment with Gleevec 400 mg by mouth daily since February 2009 and tolerating his treatment fairly well. The previous studies for BCR/ABL were unremarkable for any disease progression. His CBC is unremarkable today. The molecular study performed earlier today are still pending. I discussed the lab result with the patient today. I recommended for him to continue on the same treatment with Gleevec 400 mg by mouth daily. I will see him back for follow-up visit in 6 months with repeat CBC, comprehensive metabolic panel, LDH. I will repeat his molecular study for BCR/ABL on an annual basis at this point since the patient has been doing very well for the last 8 years. He agreed to the current plan. He was advised to call immediately if he has any concerning symptoms in the interval. The patient voices understanding of current disease status and treatment options and is in agreement with the current care plan.  All questions were answered. The patient knows to call the clinic with any problems, questions or concerns. We can certainly see the patient much sooner if necessary.  Disclaimer: This note was dictated with voice recognition software. Similar sounding words can inadvertently be transcribed and may not be corrected upon review.

## 2016-02-18 NOTE — Telephone Encounter (Signed)
appt made and avs printed °

## 2016-06-01 ENCOUNTER — Other Ambulatory Visit: Payer: Self-pay | Admitting: Medical Oncology

## 2016-06-01 DIAGNOSIS — C9210 Chronic myeloid leukemia, BCR/ABL-positive, not having achieved remission: Secondary | ICD-10-CM

## 2016-06-01 MED ORDER — IMATINIB MESYLATE 400 MG PO TABS: 400.0000 mg | ORAL_TABLET | Freq: Every day | ORAL | Status: DC

## 2016-06-01 NOTE — Progress Notes (Signed)
Mohamed  escribed gleevec rx ,

## 2016-06-10 ENCOUNTER — Other Ambulatory Visit: Payer: Self-pay | Admitting: Medical Oncology

## 2016-06-10 ENCOUNTER — Telehealth: Payer: Self-pay | Admitting: Medical Oncology

## 2016-06-10 NOTE — Telephone Encounter (Signed)
Per Julien Nordmann I told pt it is okay to take generic Gleevec.

## 2016-06-11 NOTE — Telephone Encounter (Signed)
Pt still has some trepidation with taking the imatinib over [place] .Will discuss with [facility].

## 2016-06-18 ENCOUNTER — Encounter: Payer: Self-pay | Admitting: Medical Oncology

## 2016-08-24 ENCOUNTER — Encounter: Payer: Self-pay | Admitting: Internal Medicine

## 2016-08-24 ENCOUNTER — Ambulatory Visit (HOSPITAL_BASED_OUTPATIENT_CLINIC_OR_DEPARTMENT_OTHER): Payer: BLUE CROSS/BLUE SHIELD | Admitting: Internal Medicine

## 2016-08-24 ENCOUNTER — Telehealth: Payer: Self-pay | Admitting: Medical Oncology

## 2016-08-24 ENCOUNTER — Telehealth: Payer: Self-pay | Admitting: Internal Medicine

## 2016-08-24 ENCOUNTER — Other Ambulatory Visit (HOSPITAL_BASED_OUTPATIENT_CLINIC_OR_DEPARTMENT_OTHER): Payer: BLUE CROSS/BLUE SHIELD

## 2016-08-24 VITALS — BP 128/75 | HR 71 | Temp 98.0°F | Resp 18 | Ht 70.75 in | Wt 189.9 lb

## 2016-08-24 DIAGNOSIS — C9210 Chronic myeloid leukemia, BCR/ABL-positive, not having achieved remission: Secondary | ICD-10-CM

## 2016-08-24 LAB — COMPREHENSIVE METABOLIC PANEL
ALBUMIN: 4.2 g/dL (ref 3.5–5.0)
ALT: 27 U/L (ref 0–55)
AST: 31 U/L (ref 5–34)
Alkaline Phosphatase: 80 U/L (ref 40–150)
Anion Gap: 9 mEq/L (ref 3–11)
BUN: 22.8 mg/dL (ref 7.0–26.0)
CHLORIDE: 108 meq/L (ref 98–109)
CO2: 24 meq/L (ref 22–29)
Calcium: 8.9 mg/dL (ref 8.4–10.4)
Creatinine: 1.3 mg/dL (ref 0.7–1.3)
EGFR: 66 mL/min/{1.73_m2} — AB (ref >90)
GLUCOSE: 106 mg/dL (ref 70–140)
Potassium: 3.9 mEq/L (ref 3.5–5.1)
SODIUM: 141 meq/L (ref 136–145)
TOTAL PROTEIN: 7.3 g/dL (ref 6.4–8.3)
Total Bilirubin: 0.52 mg/dL (ref 0.20–1.20)

## 2016-08-24 LAB — CBC WITH DIFFERENTIAL/PLATELET
BASO%: 0.9 % (ref 0.0–2.0)
Basophils Absolute: 0 10*3/uL (ref 0.0–0.1)
EOS ABS: 0 10*3/uL (ref 0.0–0.5)
EOS%: 0.9 % (ref 0.0–7.0)
HCT: 38.1 % — ABNORMAL LOW (ref 38.4–49.9)
HEMOGLOBIN: 13.3 g/dL (ref 13.0–17.1)
LYMPH%: 30.1 % (ref 14.0–49.0)
MCH: 32.6 pg (ref 27.2–33.4)
MCHC: 34.9 g/dL (ref 32.0–36.0)
MCV: 93.4 fL (ref 79.3–98.0)
MONO#: 0.5 10*3/uL (ref 0.1–0.9)
MONO%: 10.3 % (ref 0.0–14.0)
NEUT%: 57.8 % (ref 39.0–75.0)
NEUTROS ABS: 2.5 10*3/uL (ref 1.5–6.5)
PLATELETS: 153 10*3/uL (ref 140–400)
RBC: 4.08 10*6/uL — AB (ref 4.20–5.82)
RDW: 13 % (ref 11.0–14.6)
WBC: 4.4 10*3/uL (ref 4.0–10.3)
lymph#: 1.3 10*3/uL (ref 0.9–3.3)

## 2016-08-24 LAB — LACTATE DEHYDROGENASE: LDH: 231 U/L (ref 125–245)

## 2016-08-24 NOTE — Telephone Encounter (Signed)
err

## 2016-08-24 NOTE — Telephone Encounter (Signed)
GAVE PATIENT AVS REPORT AND APPOINTMENTS FOR MARCH  °

## 2016-08-24 NOTE — Progress Notes (Signed)
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  PRIOR THERAPY: None  CURRENT THERAPY: Gleevec 400 mg by mouth daily started February 2009  INTERVAL HISTORY: Michael [name] 50 y.o. male returns to the clinic today for six-month follow-up visit. The patient is feeling fine today with no specific complaints. He is his currently on generic Gleevec for the last 2 months and tolerating his treatment with Gleevec fairly well with no significant adverse effects. The patient denied having any significant weight loss or night sweats. He denied having any chest pain, shortness of breath, cough or hemoptysis. He has no nausea or vomiting, no fever or chills. He has repeat CBC and comprehensive metabolic panel performed earlier today and he is here for evaluation and discussion of his lab results.   MEDICAL HISTORY: Past Medical History:  Diagnosis Date  . Anal fistula   . 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 Leukocytosis, unspecified   . Liver cyst    Left hepatic dome 2 cm cyst, CT Sep2013  . Pruritus ani, intermittent 01/17/2013  . Splenomegaly    resolved CT Sep2013    ALLERGIES:  has No Known Allergies.  MEDICATIONS:  Current Outpatient Prescriptions  Medication Sig Dispense Refill  . imatinib (GLEEVEC) 400 MG tablet Take 1 tablet (400 mg total) by mouth daily. Take with meals and large glass of water.Caution:Chemotherapy. 90 tablet 2   No current facility-administered medications for this visit.     SURGICAL HISTORY:  Past Surgical History:  Procedure Laterality Date  . REFRACTIVE SURGERY  2000  . TOENAIL EXCISION  01/16/13  . TONSILECTOMY, ADENOIDECTOMY, BILATERAL MYRINGOTOMY AND TUBES     age 64    REVIEW OF SYSTEMS:  A comprehensive review  of systems was negative.   PHYSICAL EXAMINATION: General appearance: alert, cooperative and no distress Head: Normocephalic, without obvious abnormality, atraumatic Neck: no adenopathy, no JVD, supple, symmetrical, trachea midline and thyroid not enlarged, symmetric, no tenderness/mass/nodules Lymph nodes: Cervical, supraclavicular, and axillary nodes normal. Resp: clear to auscultation bilaterally Back: symmetric, no curvature. ROM normal. No CVA tenderness. Cardio: regular rate and rhythm, S1, S2 normal, no murmur, click, rub or gallop GI: soft, non-tender; bowel sounds normal; no masses,  no organomegaly Extremities: extremities normal, atraumatic, no cyanosis or edema Neurologic: Alert and oriented X 3, normal strength and tone. Normal symmetric reflexes. Normal coordination and gait  ECOG PERFORMANCE STATUS: 0 - Asymptomatic  Blood pressure 128/75, pulse 71, temperature 98 F (36.7 C), temperature source Oral, resp. rate 18, height 5' 10.75" (1.797 m), weight 189 lb 14.4 oz (86.1 kg), SpO2 100 %.  LABORATORY DATA: Lab Results  Component Value Date   WBC 4.4 08/24/2016   HGB 13.3 08/24/2016   HCT 38.1 (L) 08/24/2016   MCV 93.4 08/24/2016   PLT 153 08/24/2016      Chemistry      Component Value Date/Time   NA 141 02/18/2016 1512   K 4.3 02/18/2016 1512   CL 106 01/07/2016 0816   CL 108 (H) 01/19/2013 1206   CO2 25 02/18/2016 1512   BUN 18.4 02/18/2016 1512   CREATININE 1.3 02/18/2016 1512   GLU 97 01/17/2015      Component Value Date/Time   CALCIUM 8.8 02/18/2016 1512   ALKPHOS 81 02/18/2016 1512  AST 36 (H) 02/18/2016 1512   ALT 29 02/18/2016 1512   BILITOT 0.53 02/18/2016 1512       RADIOGRAPHIC STUDIES: No results found.  ASSESSMENT AND PLAN: This is a very pleasant 50 years old white male with history of chronic myeloid leukemia diagnosed inhibitor 2009 and the patient has been on treatment with Gleevec 400 mg by mouth daily since February 2009 and  tolerating his treatment fairly well. The previous studies for BCR/ABL were unremarkable for any disease progression. His CBC is unremarkable today. I discussed the lab result with the patient today. I recommended for him to continue on the same treatment with Gleevec 400 mg by mouth daily. I will see him back for follow-up visit in 6 months with repeat CBC, comprehensive metabolic panel, LDH as well as molecular study for BCR/ABL. He agreed to the current plan. He was advised to call immediately if he has any concerning symptoms in the interval. The patient voices understanding of current disease status and treatment options and is in agreement with the current care plan.  All questions were answered. The patient knows to call the clinic with any problems, questions or concerns. We can certainly see the patient much sooner if necessary.  Disclaimer: This note was dictated with voice recognition software. Similar sounding words can inadvertently be transcribed and may not be corrected upon review.

## 2016-09-28 ENCOUNTER — Telehealth: Payer: Self-pay | Admitting: Medical Oncology

## 2016-09-28 ENCOUNTER — Encounter: Payer: Self-pay | Admitting: Pharmacist

## 2016-09-28 NOTE — Telephone Encounter (Signed)
Medication coverage [employer] -Imatinib approved effective 08/29/16-09/28/19. Left message for pt with same message.

## 2016-09-28 NOTE — Progress Notes (Signed)
Oral Chemotherapy Pharmacist Encounter  Received notification from [place] that imatinib prescription requires prior authorization. Clinical questions answered over the phone. PA was approved, PA# [id number], approval dates: 08/29/16-09/28/2019  I called [facility] to alert them of PA approval, script re-run, received paid claim from insurance, copay $20. [facility] will call patient today to schedule delivery of next fill.  Johny Drilling, PharmD, BCPS 09/28/2016  11:07 AM Oral Chemotherapy Clinic [phone]

## 2016-09-30 ENCOUNTER — Other Ambulatory Visit: Payer: Self-pay | Admitting: Medical Oncology

## 2016-09-30 DIAGNOSIS — C9210 Chronic myeloid leukemia, BCR/ABL-positive, not having achieved remission: Secondary | ICD-10-CM

## 2016-09-30 MED ORDER — IMATINIB MESYLATE 400 MG PO TABS: 400.0000 mg | ORAL_TABLET | Freq: Every day | ORAL | 2 refills | Status: DC

## 2016-12-01 ENCOUNTER — Encounter: Payer: Self-pay | Admitting: Internal Medicine

## 2016-12-01 ENCOUNTER — Ambulatory Visit (INDEPENDENT_AMBULATORY_CARE_PROVIDER_SITE_OTHER): Payer: BLUE CROSS/BLUE SHIELD | Admitting: Internal Medicine

## 2016-12-01 VITALS — BP 126/92 | HR 98 | Temp 97.5°F | Wt 197.2 lb

## 2016-12-01 DIAGNOSIS — M7661 Achilles tendinitis, right leg: Secondary | ICD-10-CM

## 2016-12-01 NOTE — Patient Instructions (Addendum)
Achilles Tendinitis Rehab Ask your health care provider which exercises are safe for you. Do exercises exactly as told by your health care provider and adjust them as directed. It is normal to feel mild stretching, pulling, tightness, or discomfort as you do these exercises, but you should stop right away if you feel sudden pain or your pain gets worse. Do not begin these exercises until told by your health care provider. Stretching and range of motion exercises These exercises warm up your muscles and joints and improve the movement and flexibility of your ankle. These exercises also help to relieve pain, numbness, and tingling. Exercise A: Standing wall calf stretch, knee straight 1. Stand with your hands against a wall. 2. Extend your __________ leg behind you and bend your front knee slightly. Keep both of your heels on the floor. 3. Point the toes of your back foot slightly inward. 4. Keeping your heels on the floor and your back knee straight, shift your weight toward the wall. Do not allow your back to arch. You should feel a gentle stretch in your calf. 5. Hold this position for seconds. Repeat __________ times. Complete this stretch __________ times per day. Exercise B: Standing wall calf stretch, knee bent 1. Stand with your hands against a wall. 2. Extend your __________ leg behind you, and bend your front knee slightly. Keep both of your heels on the floor. 3. Point the toes of your back foot slightly inward. 4. Keeping your heels on the floor, unlock your back knee so that it is bent. You should feel a gentle stretch deep in your calf. 5. Hold this position for __________ seconds. Repeat __________ times. Complete this stretch __________ times per day. Strengthening exercises These exercises build strength and control of your ankle. Endurance is the ability to use your muscles for a long time, even after they get tired. Exercise C: Plantar flexion with band 1. Sit on the floor with  your __________ leg extended. You may put a pillow under your calf to give your foot more room to move. 2. Loop a rubber exercise band or tube around the ball of your __________ foot. The ball of your foot is on the walking surface, right under your toes. The band or tube should be slightly tense when your foot is relaxed. If the band or tube slips, you can put on your shoe or put a washcloth between the band and your foot to help it stay in place. 3. Slowly point your toes downward, pushing them away from you. 4. Hold this position for __________ seconds. 5. Slowly release the tension in the band or tube, controlling smoothly until your foot is back to the starting position. Repeat __________ times. Complete this exercise __________ times per day. Exercise D: Heel raise with eccentric lower 1. Stand on a step with the balls of your feet. The ball of your foot is on the walking surface, right under your toes.  Do not put your heels on the step.  For balance, rest your hands on the wall or on a railing. 2. Rise up onto the balls of your feet. 3. Keeping your heels up, shift all of your weight to your __________ leg and pick up your other leg. 4. Slowly lower your __________ leg so your heel drops below the level of the step. 5. Put down your foot. If told by your health care provider, build up to:  3 sets of 15 repetitions while keeping your knees straight.  3   sets of 15 repetitions while keeping your knees bent as far as told by your health care provider. Complete this exercise __________ times per day. If this exercise is too easy, try doing it while wearing a backpack with weights in it. Balance exercises These exercises improve or maintain your balance. Balance is important in preventing falls. Exercise E: Single leg stand 1. Without shoes, stand near a railing or in a door frame. Hold on to the railing or door frame as needed. 2. Stand on your __________ foot. Keep your big toe down on  the floor and try to keep your arch lifted. 3. Hold this position for __________ seconds. Repeat __________ times. Complete this exercise __________ times per day. If this exercise is too easy, you can try it with your eyes closed or while standing on a pillow. This information is not intended to replace advice given to you by your health care provider. Make sure you discuss any questions you have with your health care provider. Document Released: 07/07/2005 Document Revised: 08/12/2016 Document Reviewed: 08/12/2015 Elsevier Interactive Patient Education  2017 [employer].  Achilles Tendinitis Achilles tendinitis is inflammation of the tough, cord-like band that attaches the lower muscles of your leg to your heel (Achilles tendon). It is usually caused by overusing the tendon and joint involved.  CAUSES Achilles tendinitis can happen because of:  A sudden increase in exercise or activity (such as running).  Doing the same exercises or activities (such as jumping) over and over.  Not warming up calf muscles before exercising.  Exercising in shoes that are worn out or not made for exercise.  Having arthritis or a bone growth on the back of the heel bone. This can rub against the tendon and hurt the tendon. SIGNS AND SYMPTOMS The most common symptoms are:  Pain in the back of the leg, just above the heel. The pain usually gets worse with exercise and better with rest.  Stiffness or soreness in the back of the leg, especially in the morning.  Swelling of the skin over the Achilles tendon.  Trouble standing on tiptoe. Sometimes, an Achilles tendon tears (ruptures). Symptoms of an Achilles tendon rupture can include:  Sudden, severe pain in the back of the leg.  Trouble putting weight on the foot or walking normally. DIAGNOSIS Achilles tendinitis will be diagnosed based on symptoms and a physical examination. An X-ray may be done to check if another condition is causing your  symptoms. An MRI may be ordered if your health care provider suspects you may have completely torn your tendon, which is called an Achilles tendon rupture.  TREATMENT  Achilles tendinitis usually gets better over time. It can take weeks to months to heal completely. Treatment focuses on treating the symptoms and helping the injury heal. HOME CARE INSTRUCTIONS   Rest your Achilles tendon and avoid activities that cause pain.  Apply ice to the injured area:  Put ice in a plastic bag.  Place a towel between your skin and the bag.  Leave the ice on for 20 minutes, 2-3 times a day  Try to avoid using the tendon (other than gentle range of motion) while the tendon is painful. Do not resume use until instructed by your health care provider. Then begin use gradually. Do not increase use to the point of pain. If pain does develop, decrease use and continue the above measures. Gradually increase activities that do not cause discomfort until you achieve normal use.  Do exercises to make  your calf muscles stronger and more flexible. Your health care provider or physical therapist can recommend exercises for you to do.  Wrap your ankle with an elastic bandage or other wrap. This can help keep your tendon from moving too much. Your health care provider will show you how to wrap your ankle correctly.  Only take over-the-counter or prescription medicines for pain, discomfort, or fever as directed by your health care provider. SEEK MEDICAL CARE IF:   Your pain and swelling increase or pain is uncontrolled with medicines.  You develop new, unexplained symptoms or your symptoms get worse.  You are unable to move your toes or foot.  You develop warmth and swelling in your foot.  You have an unexplained temperature. MAKE SURE YOU:   Understand these instructions.  Will watch your condition.  Will get help right away if you are not doing well or get worse. This information is not intended to  replace advice given to you by your health care provider. Make sure you discuss any questions you have with your health care provider. Document Released: 09/15/2005 Document Revised: 12/27/2014 Document Reviewed: 07/18/2013 Elsevier Interactive Patient Education  2017 [employer].

## 2016-12-01 NOTE — Progress Notes (Signed)
   Subjective:    Patient ID: Michael [name], male    DOB: 06-Nov-1966, 50 y.o.   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  Pain is worse in the morning and after prolonged periods of inactivity, but improves with activity He has discontinued exercise on the treadmill and has started swimming recently.  Past Medical History:  Diagnosis Date  . Anal fistula   . 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 Leukocytosis, unspecified   . Liver cyst    Left hepatic dome 2 cm cyst, CT Sep2013  . Pruritus ani, intermittent 01/17/2013  . Splenomegaly    resolved CT Sep2013     Social History   Social History  . Marital status: Married    Spouse name: N/A  . Number of children: N/A  . Years of education: N/A   Occupational History  . Not on file.   Social History Main Topics  . Smoking status: Never Smoker  . Smokeless tobacco: Never Used  . Alcohol use No  . Drug use: No  . Sexual activity: Not on file   Other Topics Concern  . Not on file   Social History Narrative  . No narrative on file    Past Surgical History:  Procedure Laterality Date  . REFRACTIVE SURGERY  2000  . TOENAIL EXCISION  01/16/13  . TONSILECTOMY, ADENOIDECTOMY, BILATERAL MYRINGOTOMY AND TUBES     age 50    Family History  Problem Relation Age of Onset  . Heart disease Mother   . Cancer Father 38    prostate    No Known Allergies  Current Outpatient Prescriptions on File Prior to Visit  Medication Sig Dispense Refill  . imatinib (GLEEVEC) 400 MG tablet Take 1 tablet (400 mg total) by mouth daily. Take with meals and large glass of water.Caution:Chemotherapy. 90 tablet 2   No current facility-administered medications on file prior to visit.     BP (!) 126/92 (BP Location: Left Arm, Patient Position: Sitting, Cuff Size:  Normal)   Pulse 98   Temp 97.5 F (36.4 C) (Oral)   Wt 197 lb 3.2 oz (89.4 kg)   BMI 27.70 kg/m      Review of Systems  Constitutional: Positive for activity change.  Musculoskeletal:       Pain right heel       Objective:   Physical Exam  Constitutional: He appears well-developed and well-nourished. No distress.  Musculoskeletal:  Mild tenderness and soft tissue swelling involving the right mid Achilles tendon area Full range of motion Minimal discomfort with standing on toes Normal ankle flexion and extension          Assessment & Plan:   Right Achilles tendinitis.  Discussed at length.  Information provided.  He will attempt to moderate his activities such as treadmill work that may be aggravating.  It was recommended that he try a seven-day course of naproxen. Will consider ice therapy following overuse activities.  Was also given instructions on rehabilitation with stretching and range of motion type activities.  Sports medicine referral if unimproved  [name]

## 2016-12-01 NOTE — Progress Notes (Signed)
Pre visit review using our clinic review tool, if applicable. No additional management support is needed unless otherwise documented below in the visit note. 

## 2017-01-04 ENCOUNTER — Encounter: Payer: Self-pay | Admitting: Medical Oncology

## 2017-01-04 ENCOUNTER — Telehealth: Payer: Self-pay | Admitting: Medical Oncology

## 2017-01-04 NOTE — Telephone Encounter (Signed)
Letter re 90 day supply of gleevec faxed to mail order pharmacy.

## 2017-01-05 ENCOUNTER — Telehealth: Payer: Self-pay | Admitting: Medical Oncology

## 2017-01-05 NOTE — Telephone Encounter (Signed)
Pharmacy has rx for #90 and #30-his insurance will decide quantity-she could not confirm she received letter from [facility] requesting #90.

## 2017-01-07 ENCOUNTER — Telehealth: Payer: Self-pay | Admitting: Medical Oncology

## 2017-01-07 NOTE — Telephone Encounter (Signed)
I returned call to [facility] regarding [place]. #90 day supply denied . Needs PA. [facility] has copay assistance for #30- they will pay  $700/$3000 copay assistance is available for #30 days .I requested appeal form.

## 2017-01-17 ENCOUNTER — Telehealth: Payer: Self-pay | Admitting: Medical Oncology

## 2017-01-17 DIAGNOSIS — C9210 Chronic myeloid leukemia, BCR/ABL-positive, not having achieved remission: Secondary | ICD-10-CM

## 2017-01-17 MED ORDER — IMATINIB MESYLATE 400 MG PO TABS: 400.0000 mg | ORAL_TABLET | Freq: Every day | ORAL | 0 refills | Status: DC

## 2017-01-17 NOTE — Telephone Encounter (Signed)
Started  imatinib #30 on 1/25 . I gave Tyrian the number to contact [facility] at [place] [phone] for refills and we will try #90 at next refill.

## 2017-02-03 ENCOUNTER — Other Ambulatory Visit: Payer: Self-pay | Admitting: Internal Medicine

## 2017-02-03 DIAGNOSIS — C9210 Chronic myeloid leukemia, BCR/ABL-positive, not having achieved remission: Secondary | ICD-10-CM

## 2017-02-22 ENCOUNTER — Ambulatory Visit (HOSPITAL_BASED_OUTPATIENT_CLINIC_OR_DEPARTMENT_OTHER): Payer: BLUE CROSS/BLUE SHIELD | Admitting: Internal Medicine

## 2017-02-22 ENCOUNTER — Encounter: Payer: Self-pay | Admitting: Internal Medicine

## 2017-02-22 ENCOUNTER — Other Ambulatory Visit (HOSPITAL_BASED_OUTPATIENT_CLINIC_OR_DEPARTMENT_OTHER): Payer: BLUE CROSS/BLUE SHIELD

## 2017-02-22 ENCOUNTER — Telehealth: Payer: Self-pay | Admitting: Internal Medicine

## 2017-02-22 DIAGNOSIS — C9210 Chronic myeloid leukemia, BCR/ABL-positive, not having achieved remission: Secondary | ICD-10-CM

## 2017-02-22 DIAGNOSIS — D649 Anemia, unspecified: Secondary | ICD-10-CM

## 2017-02-22 DIAGNOSIS — D72829 Elevated white blood cell count, unspecified: Secondary | ICD-10-CM

## 2017-02-22 LAB — CBC WITH DIFFERENTIAL/PLATELET
BASO%: 0.3 % (ref 0.0–2.0)
Basophils Absolute: 0 10*3/uL (ref 0.0–0.1)
EOS%: 0.8 % (ref 0.0–7.0)
Eosinophils Absolute: 0 10*3/uL (ref 0.0–0.5)
HCT: 37.4 % — ABNORMAL LOW (ref 38.4–49.9)
HGB: 12.9 g/dL — ABNORMAL LOW (ref 13.0–17.1)
LYMPH#: 1.5 10*3/uL (ref 0.9–3.3)
LYMPH%: 40.3 % (ref 14.0–49.0)
MCH: 33.1 pg (ref 27.2–33.4)
MCHC: 34.5 g/dL (ref 32.0–36.0)
MCV: 95.9 fL (ref 79.3–98.0)
MONO#: 0.3 10*3/uL (ref 0.1–0.9)
MONO%: 7.9 % (ref 0.0–14.0)
NEUT%: 50.7 % (ref 39.0–75.0)
NEUTROS ABS: 1.9 10*3/uL (ref 1.5–6.5)
Platelets: 144 10*3/uL (ref 140–400)
RBC: 3.9 10*6/uL — AB (ref 4.20–5.82)
RDW: 13.1 % (ref 11.0–14.6)
WBC: 3.7 10*3/uL — AB (ref 4.0–10.3)

## 2017-02-22 LAB — COMPREHENSIVE METABOLIC PANEL
ALT: 30 U/L (ref 0–55)
AST: 36 U/L — AB (ref 5–34)
Albumin: 4.3 g/dL (ref 3.5–5.0)
Alkaline Phosphatase: 96 U/L (ref 40–150)
Anion Gap: 8 mEq/L (ref 3–11)
BUN: 19.5 mg/dL (ref 7.0–26.0)
CHLORIDE: 109 meq/L (ref 98–109)
CO2: 24 meq/L (ref 22–29)
CREATININE: 1.2 mg/dL (ref 0.7–1.3)
Calcium: 9.3 mg/dL (ref 8.4–10.4)
EGFR: 71 mL/min/{1.73_m2} — ABNORMAL LOW (ref >90)
GLUCOSE: 93 mg/dL (ref 70–140)
Potassium: 3.9 mEq/L (ref 3.5–5.1)
SODIUM: 141 meq/L (ref 136–145)
Total Bilirubin: 0.48 mg/dL (ref 0.20–1.20)
Total Protein: 7 g/dL (ref 6.4–8.3)

## 2017-02-22 LAB — LACTATE DEHYDROGENASE: LDH: 232 U/L (ref 125–245)

## 2017-02-22 NOTE — Progress Notes (Signed)
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  PRIOR THERAPY: None  CURRENT THERAPY: Gleevec 400 mg by mouth daily started February 2009  INTERVAL HISTORY: Michael [name] 51 y.o. male came to the clinic today for his routine six-month follow-up visit. The patient is feeling fine with no specific complaints today. He intentionally lost few pounds since his last visit. He denied having any night sweats. He has no chest pain, shortness of breath, cough or hemoptysis. The patient denied having any nausea, vomiting, diarrhea or constipation. He is tolerating his treatment with Gleevec fairly well. He is here today for evaluation and repeat blood work.   MEDICAL HISTORY: Past Medical History:  Diagnosis Date  . Anal fistula   . 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 Leukocytosis, unspecified   . Liver cyst    Left hepatic dome 2 cm cyst, CT Sep2013  . Pruritus ani, intermittent 01/17/2013  . Splenomegaly    resolved CT Sep2013    ALLERGIES:  has No Known Allergies.  MEDICATIONS:  Current Outpatient Prescriptions  Medication Sig Dispense Refill  . imatinib (GLEEVEC) 400 MG tablet TAKE 1 TABLET BY MOUTH ONCE DAILY AT THE SAME TIME WITH FOOD AND A LARGE GLASS OF WATER. DO NOT CRUSH TABLETS. AVOID GRAPEFRUIT PRODUCTS. (C 90 tablet 0   No current facility-administered medications for this visit.     SURGICAL HISTORY:  Past Surgical History:  Procedure Laterality Date  . REFRACTIVE SURGERY  2000  . TOENAIL EXCISION  01/16/13  . TONSILECTOMY, ADENOIDECTOMY, BILATERAL MYRINGOTOMY AND TUBES     age 22    REVIEW OF SYSTEMS:  A comprehensive review of systems was negative.   PHYSICAL EXAMINATION: General appearance: alert, cooperative and no  distress Head: Normocephalic, without obvious abnormality, atraumatic Neck: no adenopathy, no JVD, supple, symmetrical, trachea midline and thyroid not enlarged, symmetric, no tenderness/mass/nodules Lymph nodes: Cervical, supraclavicular, and axillary nodes normal. Resp: clear to auscultation bilaterally Back: symmetric, no curvature. ROM normal. No CVA tenderness. Cardio: regular rate and rhythm, S1, S2 normal, no murmur, click, rub or gallop GI: soft, non-tender; bowel sounds normal; no masses,  no organomegaly Extremities: extremities normal, atraumatic, no cyanosis or edema  ECOG PERFORMANCE STATUS: 0 - Asymptomatic  There were no vitals taken for this visit.  LABORATORY DATA: Lab Results  Component Value Date   WBC 3.7 (L) 02/22/2017   HGB 12.9 (L) 02/22/2017   HCT 37.4 (L) 02/22/2017   MCV 95.9 02/22/2017   PLT 144 02/22/2017      Chemistry      Component Value Date/Time   NA 141 02/22/2017 1541   K 3.9 02/22/2017 1541   CL 106 01/07/2016 0816   CL 108 (H) 01/19/2013 1206   CO2 24 02/22/2017 1541   BUN 19.5 02/22/2017 1541   CREATININE 1.2 02/22/2017 1541   GLU 97 01/17/2015      Component Value Date/Time   CALCIUM 9.3 02/22/2017 1541   ALKPHOS 96 02/22/2017 1541   AST 36 (H) 02/22/2017 1541   ALT 30 02/22/2017 1541   BILITOT 0.48 02/22/2017 1541       RADIOGRAPHIC STUDIES: No results found.  ASSESSMENT AND PLAN:  This is a very pleasant 51 years old white male with chronic myeloid leukemia diagnosed in February 2009.  The patient has been on treatment with Gleevec 400 mg by mouth daily since that time and tolerating his treatment well. His CBC, comprehensive metabolic panel and LDH are unremarkable except for mild anemia and leukocytopenia. I recommended for him to continue on the same dose of his treatment with Gleevec. I will see him back for follow-up visit in 6 months for reevaluation with repeat CBC, comprehensive metabolic panel, LDH and molecular  study for BCR/ABL. He was advised to call immediately if he has any concerning symptoms in the interval. The patient voices understanding of current disease status and treatment options and is in agreement with the current care plan.  All questions were answered. The patient knows to call the clinic with any problems, questions or concerns. We can certainly see the patient much sooner if necessary. I spent 10 minutes counseling the patient face to face. The total time spent in the appointment was 15 minutes.  Disclaimer: This note was dictated with voice recognition software. Similar sounding words can inadvertently be transcribed and may not be corrected upon review.

## 2017-02-22 NOTE — Telephone Encounter (Signed)
Appointments scheduled per 3/6 LOS. Patient given AVS report and calendars with future scheduled appointments. °

## 2017-04-25 ENCOUNTER — Telehealth: Payer: Self-pay | Admitting: Internal Medicine

## 2017-04-25 DIAGNOSIS — M7661 Achilles tendinitis, right leg: Secondary | ICD-10-CM

## 2017-04-25 NOTE — Telephone Encounter (Signed)
Pt would like a referral to sport med md. Pt is having achilles tendinitis right leg. Pt is aware md out of office until 04-29-17

## 2017-04-29 NOTE — Telephone Encounter (Signed)
Is it okay to place referral? 

## 2017-05-02 NOTE — Telephone Encounter (Signed)
Yes.  Okay to refer

## 2017-05-02 NOTE — Telephone Encounter (Signed)
Referral entered and I called the pt and informed him someone will call with appt info and he agreed.

## 2017-05-06 ENCOUNTER — Ambulatory Visit: Payer: BLUE CROSS/BLUE SHIELD | Admitting: Sports Medicine

## 2017-05-13 ENCOUNTER — Ambulatory Visit: Payer: Self-pay

## 2017-05-13 ENCOUNTER — Ambulatory Visit (INDEPENDENT_AMBULATORY_CARE_PROVIDER_SITE_OTHER): Payer: BLUE CROSS/BLUE SHIELD | Admitting: Sports Medicine

## 2017-05-13 ENCOUNTER — Encounter: Payer: Self-pay | Admitting: Sports Medicine

## 2017-05-13 VITALS — BP 120/86 | HR 64 | Ht 70.75 in | Wt 187.2 lb

## 2017-05-13 DIAGNOSIS — M7661 Achilles tendinitis, right leg: Secondary | ICD-10-CM | POA: Diagnosis not present

## 2017-05-13 MED ORDER — NITROGLYCERIN 0.2 MG/HR TD PT24: MEDICATED_PATCH | TRANSDERMAL | 1 refills | Status: DC

## 2017-05-13 NOTE — Patient Instructions (Addendum)
Please perform the exercise program that Jeneen Rinks has prepared for you and gone over in detail on a daily basis.  In addition to the handout you were provided you can access your program through: [email or web address]   Your unique program code is: [email or web address]  I recommend you obtained a compression sleeve to help with your joint problems. There are many options on the market however I recommend obtaining a FULL ANKLE MEDIUM Body Helix compression sleeve.  You can find information (including how to appropriate measure yourself for sizing) can be found at www.Body http://www.lambert.com/.  Many of these products are health savings account (HSA) eligible.   You can use the compression sleeve at any time throughout the day but is most important to use while being active as well as for 2 hours post-activity.   It is appropriate to ice following activity with the compression sleeve in place.   Nitroglycerin Protocol   Apply 1/4 nitroglycerin patch to affected area daily.  Change position of patch within the affected area every 24 hours.  You may experience a headache during the first 1-2 weeks of using the patch, these should subside.  If you experience headaches after beginning nitroglycerin patch treatment, you may take your preferred over the counter pain reliever.  Another side effect of the nitroglycerin patch is skin irritation or rash related to patch adhesive.  Please notify our office if you develop more severe headaches or rash, and stop the patch.  Tendon healing with nitroglycerin patch may require 12 to 24 weeks depending on the extent of injury.  Men should not use if taking Viagra, Cialis, or Levitra.   Do not use if you have migraines or rosacea.

## 2017-05-13 NOTE — Progress Notes (Signed)
OFFICE VISIT NOTE Michael [name]. Michael [name], [place] at [facility] [place] - 51 y.o. male MRN [id number]  Date of birth: 01/14/1966  Visit Date: 05/13/2017  PCP: Marletta Lor, MD   Referred by: Marletta Lor, MD  Burlene Arnt, CMA acting as scribe for Dr. Paulla Fore.  SUBJECTIVE:   Chief Complaint  Patient presents with  . right achilles tendon pain   HPI: As below and per problem based documentation when appropriate.  Pt presents today with complaint of right achilles tendon pain. Pain started about 8 months ago. He saw PCP 11/2016 for this pain and was given exercises. Pt was semi compliant with these. Pt has had some swelling around the achilles tendon.  No known injury or trauma.   The pain is described as aching and is rated as 4/10.  Worsened with inactivity, pain hurts worse when first getting up but improves after he has been walking for while.  Therapies tried include : Pt has not tried any medications for the pain. Stretching gives some relief but it is short term.   Other associated symptoms include: none  Pt denies fever, chills, night sweat.     Review of Systems  Constitutional: Negative for chills and fever.  HENT: Negative.   Eyes: Negative.   Respiratory: Negative for shortness of breath and wheezing.   Cardiovascular: Negative for chest pain, palpitations and leg swelling.  Gastrointestinal: Negative.   Genitourinary: Negative.   Musculoskeletal: Negative for falls.  Skin: Negative.   Neurological: Negative for dizziness, tingling and headaches.  Endo/Heme/Allergies: Does not bruise/bleed easily.    Otherwise per HPI.  HISTORY & PERTINENT PRIOR DATA:  No specialty comments available. He reports that he has never smoked. He has never used smokeless tobacco. No results for input(s): HGBA1C, LABURIC in the last 8760 hours. Medications & Allergies reviewed per EMR Patient Active  Problem List   Diagnosis Date Noted  . Tendonitis, Achilles, right 05/22/2017  . Lumbar radiculopathy 10/31/2013  . Pruritus ani, intermittent 01/17/2013  . Perirectal fistula intersphincteric s/p LIFT  10/09/2012  . 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 Anemia 02/12/2008   Past Medical History:  Diagnosis Date  . Anal fistula   . 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 Leukocytosis, unspecified   . Liver cyst    Left hepatic dome 2 cm cyst, CT Sep2013  . Pruritus ani, intermittent 01/17/2013  . Splenomegaly    resolved CT Sep2013   Family History  Problem Relation Age of Onset  . Heart disease Mother   . Cancer Father 69       prostate   Past Surgical History:  Procedure Laterality Date  . REFRACTIVE SURGERY  2000  . TOENAIL EXCISION  01/16/13  . TONSILECTOMY, ADENOIDECTOMY, BILATERAL MYRINGOTOMY AND TUBES     age 89   Social History   Occupational History  . Not on file.   Social History Main Topics  . Smoking status: Never Smoker  . Smokeless tobacco: Never Used  . Alcohol use No  . Drug use: No  . Sexual activity: Not on file    OBJECTIVE:  VS:  HT:5' 10.75" (179.7 cm)   WT:187 lb 3.2 oz (84.9 kg)  BMI:26.3    BP:120/86  HR:64bpm  TEMP: ( )  RESP:97 % EXAM: Findings:  WDWN, NAD, Non-toxic appearing Alert & appropriately interactive Not depressed or anxious appearing No increased work  of breathing. Pupils are equal. EOM intact without nystagmus No clubbing or cyanosis of the extremities appreciated No significant rashes/lesions/ulcerations overlying the examined area. DP & PT pulses 2+/4.  No significant pretibial edema. Sensation intact to light touch in lower extremities.  Right foot and ankle: Overall well aligned.  He has marked swelling is noticeable within the avascular zone of the Achilles.  This is moderately tender to palpation.  Ankle dorsiflexion strength is intact.  Intrinsic ankle strength is  normal.   ++++++++++++++++++++++++++++++++++++++++++++++++++++++++++++++++++ LIMITED MSK ULTRASOUND OF right Achilles Images were obtained and interpreted by myself, Teresa Coombs, DO  Images have been saved and stored to PACS system. Images obtained on: GE S7 Ultrasound machine  FINDINGS:  Marked thickening within the avascular zone measuring 1.22 cm.  There is loss of fascicles within this region and increased interstitial fluid.  Minimal neovascularity within the midportion of the tendon..  IMPRESSION:  Chronic Achilles tendinopathy     No results found. ASSESSMENT & PLAN:   Problem List Items Addressed This Visit    Tendonitis, Achilles, right - Primary    More tendinopathy changes with marked thickening within the avascular zone measuring 1.2 cm ultrasound.  Allison protocol as well as nitro glycerin protocol reviewed in detail with the patient today.  Body Healix compression sleeve provided.  We did discuss the increased risk of Achilles tendon rupture this condition  +++++++++++++++++++++++++++++++++++++++++++++++++++++++++++++++ PROCEDURE NOTE: THERAPEUTIC EXERCISES (97110) 15 minutes spent for Therapeutic exercises as stated in above notes.  This included exercises focusing on stretching, strengthening, with significant focus on eccentric aspects.   Proper technique shown and discussed handout in great detail with ATC.  All questions were discussed and answered.        Relevant Medications   nitroGLYCERIN (NITRODUR - DOSED IN MG/24 HR) 0.2 mg/hr patch   Other Relevant Orders   [place] LIMITED JOINT SPACE STRUCTURES LOW RIGHT(NO LINKED CHARGES)      Follow-up: Return in about 6 weeks (around 06/24/2017) for repeat diagnostic ultrasound.   CMA/ATC served as Education administrator during this visit. History, Physical, and Plan performed by medical provider. Documentation and orders reviewed and attested to.      Teresa Coombs, [place] [facility] Physician

## 2017-05-22 DIAGNOSIS — M6788 Other specified disorders of synovium and tendon, other site: Secondary | ICD-10-CM | POA: Insufficient documentation

## 2017-05-22 NOTE — Assessment & Plan Note (Addendum)
More tendinopathy changes with marked thickening within the avascular zone measuring 1.2 cm ultrasound.  Allison protocol as well as nitro glycerin protocol reviewed in detail with the patient today.  Body Healix compression sleeve provided.  We did discuss the increased risk of Achilles tendon rupture this condition  +++++++++++++++++++++++++++++++++++++++++++++++++++++++++++++++ PROCEDURE NOTE: THERAPEUTIC EXERCISES (97110) 15 minutes spent for Therapeutic exercises as stated in above notes.  This included exercises focusing on stretching, strengthening, with significant focus on eccentric aspects.   Proper technique shown and discussed handout in great detail with ATC.  All questions were discussed and answered.

## 2017-06-24 ENCOUNTER — Ambulatory Visit: Payer: BLUE CROSS/BLUE SHIELD | Admitting: Sports Medicine

## 2017-07-01 ENCOUNTER — Ambulatory Visit (INDEPENDENT_AMBULATORY_CARE_PROVIDER_SITE_OTHER): Payer: BLUE CROSS/BLUE SHIELD | Admitting: Sports Medicine

## 2017-07-01 ENCOUNTER — Encounter: Payer: Self-pay | Admitting: Sports Medicine

## 2017-07-01 ENCOUNTER — Ambulatory Visit: Payer: Self-pay

## 2017-07-01 VITALS — BP 142/92 | HR 72 | Ht 70.75 in | Wt 185.2 lb

## 2017-07-01 DIAGNOSIS — M7661 Achilles tendinitis, right leg: Secondary | ICD-10-CM

## 2017-07-01 NOTE — Progress Notes (Signed)
OFFICE VISIT NOTE Michael [name]. [place], [place] at [place] - 51 y.o. male MRN [id number]  Date of birth: May 15, 1966  Visit Date: 07/01/2017  PCP: Marletta Lor, MD   Referred by: Marletta Lor, MD  Michael [name], CMA acting as scribe for Dr. Paulla Fore.  SUBJECTIVE:   Chief Complaint  Patient presents with  . Follow-up    achilles tendon pain   HPI: As below and per problem based documentation when appropriate.  Pt presents today for 6 week follow-up of right achilles tendon pain. He was prescribed Nitroglycerin patches and advised to follow Nitro Protocol. He was also provided with Body Helix compression sleeve.  Pt has been compliant with using the patches and the compression sleeve. He reports some improvement in pain/swelling since his last visit. He says that it is not completely better but he feels like it is getting there. He still has some pain when standing after sitting for prolonged periods of time but even that has gotten a little better.   Pt denies fever, chills, night sweats.     Review of Systems  Constitutional: Negative for chills and fever.  Respiratory: Negative for shortness of breath and wheezing.   Cardiovascular: Negative for chest pain and palpitations.  Musculoskeletal: Negative for falls.  Neurological: Negative for dizziness, tingling and headaches.  Endo/Heme/Allergies: Does not bruise/bleed easily.    Otherwise per HPI.  HISTORY & PERTINENT PRIOR DATA:  No specialty comments available. He reports that he has never smoked. He has never used smokeless tobacco. No results for input(s): HGBA1C, LABURIC in the last 8760 hours. Medications & Allergies reviewed per EMR Patient Active Problem List   Diagnosis Date Noted  . Tendonitis, Achilles, right 05/22/2017  . Lumbar radiculopathy 10/31/2013  . Pruritus ani, intermittent 01/17/2013  . Perirectal fistula  intersphincteric s/p LIFT  10/09/2012  . 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 Anemia 02/12/2008   Past Medical History:  Diagnosis Date  . Anal fistula   . 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 Leukocytosis, unspecified   . Liver cyst    Left hepatic dome 2 cm cyst, CT Sep2013  . Pruritus ani, intermittent 01/17/2013  . Splenomegaly    resolved CT Sep2013   Family History  Problem Relation Age of Onset  . Heart disease Mother   . Cancer Father 46       prostate   Past Surgical History:  Procedure Laterality Date  . REFRACTIVE SURGERY  2000  . TOENAIL EXCISION  01/16/13  . TONSILECTOMY, ADENOIDECTOMY, BILATERAL MYRINGOTOMY AND TUBES     age 74   Social History   Occupational History  . Not on file.   Social History Main Topics  . Smoking status: Never Smoker  . Smokeless tobacco: Never Used  . Alcohol use No  . Drug use: No  . Sexual activity: Not on file    OBJECTIVE:  VS:  HT:5' 10.75" (179.7 cm)   WT:185 lb 3.2 oz (84 kg)  BMI:26.1    BP:(!) 142/92  HR:72bpm  TEMP: ( )  RESP:98 % EXAM: Findings:  Persistent swelling within the left Achilles.  There is mild TTP in this area.  His dorsiflexion is to 100.  Ankle exam is stable.     No results found. ASSESSMENT & PLAN:     ICD-10-CM   1. Tendonitis, Achilles, right M76.61 [place] LIMITED JOINT SPACE  STRUCTURES LOW RIGHT(NO LINKED CHARGES)  ================================================================= Tendonitis, Achilles, right Persistent severe thickening and generalized hypoechoic change of the Achilles is still present.  Alfredson exercises reinforced and continue the nitroglycerin protocol.  Avoidance of explosive movements are recommended.  Follow-up in 6 weeks for consideration of discontinuation of therapy. ================================================================= There are no Patient Instructions on file for this  visit.=================================================================  Follow-up: Return in about 6 weeks (around 08/12/2017).   CMA/ATC served as Education administrator during this visit. History, Physical, and Plan performed by medical provider. Documentation and orders reviewed and attested to.      Teresa Coombs, [place] [facility] Physician

## 2017-08-09 NOTE — Procedures (Signed)
LIMITED MSK ULTRASOUND OF Right Achilles Images were obtained and interpreted by myself, Teresa Coombs, DO  Images have been saved and stored to PACS system. Images obtained on: GE S7 Ultrasound machine  FINDINGS:   Persistent fusiform swelling of the right Achilles although improved proximally.  Marked increase in  intrasubstance neovascularity  IMPRESSION:  1. Healing, severe Achilles tendinosis

## 2017-08-09 NOTE — Assessment & Plan Note (Signed)
Persistent severe thickening and generalized hypoechoic change of the Achilles is still present.  Alfredson exercises reinforced and continue the nitroglycerin protocol.  Avoidance of explosive movements are recommended.  Follow-up in 6 weeks for consideration of discontinuation of therapy.

## 2017-08-12 ENCOUNTER — Ambulatory Visit (INDEPENDENT_AMBULATORY_CARE_PROVIDER_SITE_OTHER): Payer: BLUE CROSS/BLUE SHIELD | Admitting: Sports Medicine

## 2017-08-12 ENCOUNTER — Encounter: Payer: Self-pay | Admitting: Sports Medicine

## 2017-08-12 ENCOUNTER — Ambulatory Visit: Payer: Self-pay

## 2017-08-12 VITALS — BP 130/88 | HR 61 | Ht 70.75 in | Wt 185.0 lb

## 2017-08-12 DIAGNOSIS — M7661 Achilles tendinitis, right leg: Secondary | ICD-10-CM | POA: Diagnosis not present

## 2017-08-12 DIAGNOSIS — M6788 Other specified disorders of synovium and tendon, other site: Secondary | ICD-10-CM

## 2017-08-12 DIAGNOSIS — M7660 Achilles tendinitis, unspecified leg: Secondary | ICD-10-CM

## 2017-08-12 NOTE — Procedures (Signed)
LIMITED MSK ULTRASOUND OF Right Achilles Images were obtained and interpreted by myself, Teresa Coombs, DO  Images have been saved and stored to PACS system. Images obtained on: GE S7 Ultrasound machine  FINDINGS:   Achilles insertion at the calcaneus is normal appearing.  There is significant fusiform swelling of the avascular zone of the Achilles with actual noticeable difference in the fascicles structure with the ability to trace fascicles through the tendon compared to prior where it was poorly defined.  There is marked increased neovascularity compared to last visit  Tendon measurement is slightly smaller 1.16 today  IMPRESSION:  1. Healing Achilles tendinosis with marked increased neovascularity suggesting overall healing.

## 2017-08-12 NOTE — Assessment & Plan Note (Signed)
Patient symptoms have improved to the point that he is actually running on a manual  parabolic treadmill however continues to have symptoms following this type of activity likely related to the excessive dorsiflexion that he is performing.  Should continue with his Alfredson exercises, Body Helix compression sleeve, nitroglycerin protocol and decrease the amount of excessive dorsiflexion activities that he is performing.  This has taken longer than ideal however he does remain quite active and can appreciate that he is improving albeit slowly.    Follow-up in 6-8 weeks for additional reevaluation and I suspect in the interim significant improvement in his day-to-day symptoms given the extensive healing we are seeing.

## 2017-08-12 NOTE — Progress Notes (Signed)
OFFICE VISIT NOTE Michael [name]. Michael [name], [place] at [place] - 51 y.o. male MRN [id number]  Date of birth: 1966/10/04  Visit Date: 08/12/2017  PCP: Marletta Lor, MD   Referred by: Marletta Lor, MD  Burlene Arnt, CMA acting as scribe for Dr. Paulla Fore.  SUBJECTIVE:   Chief Complaint  Patient presents with  . Follow-up    achilles tendon pain, rt   HPI: As below and per problem based documentation when appropriate.  Mr. Michael [name] is an established patient presenting today in follow-up of right achilles pain. He was last seen 07/01/2017 and advised to follow Nitrol Protocol, work on [employer] exercises, and avoid explosive movements.   Pt is using Nitroglycerin patches, 1/2 patch q24h, with no side effects. He has been doing Alfredson exercises normally once in the morning or when he goes to the gym. He is wearing Body Helix compression sleeve. He feels that the pain is slightly better but definitely not completely better. Pain is worse after inactivity.      Review of Systems  Constitutional: Negative for chills and fever.  Respiratory: Negative for shortness of breath and wheezing.   Cardiovascular: Negative for chest pain, palpitations and leg swelling.  Musculoskeletal: Negative for falls.  Neurological: Negative for dizziness, tingling and headaches.  Endo/Heme/Allergies: Does not bruise/bleed easily.    Otherwise per HPI.  HISTORY & PERTINENT PRIOR DATA:  No specialty comments available. He reports that he has never smoked. He has never used smokeless tobacco. No results for input(s): HGBA1C, LABURIC in the last 8760 hours. Medications & Allergies reviewed per EMR Patient Active Problem List   Diagnosis Date Noted  . Achilles tendinosis 05/22/2017  . Lumbar radiculopathy 10/31/2013  . Pruritus ani, intermittent 01/17/2013  . Perirectal fistula intersphincteric s/p LIFT  10/09/2012    . 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 Anemia 02/12/2008   Past Medical History:  Diagnosis Date  . Anal fistula   . 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 Leukocytosis, unspecified   . Liver cyst    Left hepatic dome 2 cm cyst, CT Sep2013  . Pruritus ani, intermittent 01/17/2013  . Splenomegaly    resolved CT Sep2013   Family History  Problem Relation Age of Onset  . Heart disease Mother   . Cancer Father 44       prostate   Past Surgical History:  Procedure Laterality Date  . REFRACTIVE SURGERY  2000  . TOENAIL EXCISION  01/16/13  . TONSILECTOMY, ADENOIDECTOMY, BILATERAL MYRINGOTOMY AND TUBES     age 21   Social History   Occupational History  . Not on file.   Social History Main Topics  . Smoking status: Never Smoker  . Smokeless tobacco: Never Used  . Alcohol use No  . Drug use: No  . Sexual activity: Not on file    OBJECTIVE:  VS:  HT:5' 10.75" (179.7 cm)   WT:185 lb (83.9 kg)  BMI:25.99    BP:130/88  HR:61bpm  TEMP: ( )  RESP:97 % EXAM: Findings:  Right Achilles is persistently swollen but much less tender than in the past.  He has good ankle dorsiflexion and plantarflexion strength.  No focal calf pain.  Foot shape is normal.  No focal foot pain.     [place] Limited Joint Space Structures Low Right(no Linked Charges)  Result Date: 08/12/2017 Gerda Diss, DO  08/12/2017 11:37 PM LIMITED MSK ULTRASOUND OF Right Achilles Images were obtained and interpreted by myself, Teresa Coombs, DO Images have been saved and stored to PACS system. Images obtained on: GE S7 Ultrasound machine FINDINGS:  Achilles insertion at the calcaneus is normal appearing.  There is significant fusiform swelling of the avascular zone of the Achilles with actual noticeable difference in the fascicles structure with the ability to trace fascicles through the tendon compared to prior where it was poorly defined.  There is marked increased  neovascularity compared to last visit  Tendon measurement is slightly smaller 1.16 today IMPRESSION: 1. Healing Achilles tendinosis with marked increased neovascularity suggesting overall healing.   ASSESSMENT & PLAN:     ICD-10-CM   1. Tendonitis, Achilles, right M76.61 [place] LIMITED JOINT SPACE STRUCTURES LOW RIGHT(NO LINKED CHARGES)  2. Achilles tendinosis M76.60   ================================================================= Achilles tendinosis Patient symptoms have improved to the point that he is actually running on a manual  parabolic treadmill however continues to have symptoms following this type of activity likely related to the excessive dorsiflexion that he is performing.  Should continue with his Alfredson exercises, Body Helix compression sleeve, nitroglycerin protocol and decrease the amount of excessive dorsiflexion activities that he is performing.  This has taken longer than ideal however he does remain quite active and can appreciate that he is improving albeit slowly.    Follow-up in 6-8 weeks for additional reevaluation and I suspect in the interim significant improvement in his day-to-day symptoms given the extensive healing we are seeing.    ================================================================= There are no Patient Instructions on file for this visit.================================================================= Future Appointments Date Time Provider [place]  08/23/2017 3:00 PM [facility] 1 [facility] None  08/23/2017 3:30 PM Curt Bears, MD [facility] None    Follow-up: Return in about 6 weeks (around 09/23/2017) for repeat clinical exam, repeat diagnostic ultrasound.   CMA/ATC served as Education administrator during this visit. History, Physical, and Plan performed by medical provider. Documentation and orders reviewed and attested to.      Teresa Coombs, [place] [facility] Physician

## 2017-08-23 ENCOUNTER — Ambulatory Visit (HOSPITAL_BASED_OUTPATIENT_CLINIC_OR_DEPARTMENT_OTHER): Payer: BLUE CROSS/BLUE SHIELD | Admitting: Internal Medicine

## 2017-08-23 ENCOUNTER — Encounter: Payer: Self-pay | Admitting: Internal Medicine

## 2017-08-23 ENCOUNTER — Other Ambulatory Visit (HOSPITAL_BASED_OUTPATIENT_CLINIC_OR_DEPARTMENT_OTHER): Payer: BLUE CROSS/BLUE SHIELD

## 2017-08-23 ENCOUNTER — Telehealth: Payer: Self-pay | Admitting: Internal Medicine

## 2017-08-23 VITALS — BP 134/86 | HR 60 | Temp 97.9°F | Resp 18 | Wt 185.2 lb

## 2017-08-23 DIAGNOSIS — C9210 Chronic myeloid leukemia, BCR/ABL-positive, not having achieved remission: Secondary | ICD-10-CM

## 2017-08-23 LAB — COMPREHENSIVE METABOLIC PANEL
ALT: 32 U/L (ref 0–55)
AST: 33 U/L (ref 5–34)
Albumin: 4.2 g/dL (ref 3.5–5.0)
Alkaline Phosphatase: 100 U/L (ref 40–150)
Anion Gap: 7 mEq/L (ref 3–11)
BUN: 20.2 mg/dL (ref 7.0–26.0)
CALCIUM: 9.3 mg/dL (ref 8.4–10.4)
CHLORIDE: 108 meq/L (ref 98–109)
CO2: 26 mEq/L (ref 22–29)
CREATININE: 1.3 mg/dL (ref 0.7–1.3)
EGFR: 63 mL/min/{1.73_m2} — ABNORMAL LOW (ref >90)
Glucose: 104 mg/dl (ref 70–140)
Potassium: 3.8 mEq/L (ref 3.5–5.1)
SODIUM: 141 meq/L (ref 136–145)
Total Bilirubin: 0.54 mg/dL (ref 0.20–1.20)
Total Protein: 7.3 g/dL (ref 6.4–8.3)

## 2017-08-23 LAB — CBC WITH DIFFERENTIAL/PLATELET
BASO%: 0.5 % (ref 0.0–2.0)
BASOS ABS: 0 10*3/uL (ref 0.0–0.1)
EOS ABS: 0.1 10*3/uL (ref 0.0–0.5)
EOS%: 1.5 % (ref 0.0–7.0)
HCT: 38.1 % — ABNORMAL LOW (ref 38.4–49.9)
HGB: 13.1 g/dL (ref 13.0–17.1)
LYMPH%: 28 % (ref 14.0–49.0)
MCH: 32.9 pg (ref 27.2–33.4)
MCHC: 34.4 g/dL (ref 32.0–36.0)
MCV: 95.7 fL (ref 79.3–98.0)
MONO#: 0.3 10*3/uL (ref 0.1–0.9)
MONO%: 6.9 % (ref 0.0–14.0)
NEUT%: 63.1 % (ref 39.0–75.0)
NEUTROS ABS: 2.5 10*3/uL (ref 1.5–6.5)
PLATELETS: 150 10*3/uL (ref 140–400)
RBC: 3.98 10*6/uL — AB (ref 4.20–5.82)
RDW: 12.8 % (ref 11.0–14.6)
WBC: 4 10*3/uL (ref 4.0–10.3)
lymph#: 1.1 10*3/uL (ref 0.9–3.3)

## 2017-08-23 NOTE — Progress Notes (Signed)
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  PRIOR THERAPY: None  CURRENT THERAPY: Gleevec 400 mg by mouth daily started February 2009  INTERVAL HISTORY: Michael [name] 51 y.o. male returns to the clinic today for six-month follow-up visit. The patient is feeling fine today with no specific complaints except for right Achilles tendon injury and he currently has an ankle brace. He is feeling fine. He denied having any adverse effects from his treatment with Gleevec. He denied having any weight loss or night sweats. He has no nausea, vomiting, diarrhea or constipation. He denied having any swelling of the lower extremities. He has no chest pain, shortness of breath, cough or hemoptysis. He is here today for evaluation and repeat blood work.  MEDICAL HISTORY: Past Medical History:  Diagnosis Date  . Anal fistula   . 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 Leukocytosis, unspecified   . Liver cyst    Left hepatic dome 2 cm cyst, CT Sep2013  . Pruritus ani, intermittent 01/17/2013  . Splenomegaly    resolved CT Sep2013    ALLERGIES:  has No Known Allergies.  MEDICATIONS:  Current Outpatient Prescriptions  Medication Sig Dispense Refill  . imatinib (GLEEVEC) 400 MG tablet TAKE 1 TABLET BY MOUTH ONCE DAILY AT THE SAME TIME WITH FOOD AND A LARGE GLASS OF WATER. DO NOT CRUSH TABLETS. AVOID GRAPEFRUIT PRODUCTS. (C 90 tablet 0  . nitroGLYCERIN (NITRODUR - DOSED IN MG/24 HR) 0.2 mg/hr patch Place 1/4 of patch over affected region. Remove and replace once daily.  Slightly alter skin placement daily 30 patch 1   No current facility-administered medications for this visit.     SURGICAL HISTORY:  Past Surgical History:  Procedure Laterality Date  . REFRACTIVE  SURGERY  2000  . TOENAIL EXCISION  01/16/13  . TONSILECTOMY, ADENOIDECTOMY, BILATERAL MYRINGOTOMY AND TUBES     age 75    REVIEW OF SYSTEMS:  A comprehensive review of systems was negative.   PHYSICAL EXAMINATION: General appearance: alert, cooperative and no distress Head: Normocephalic, without obvious abnormality, atraumatic Neck: no adenopathy, no JVD, supple, symmetrical, trachea midline and thyroid not enlarged, symmetric, no tenderness/mass/nodules Lymph nodes: Cervical, supraclavicular, and axillary nodes normal. Resp: clear to auscultation bilaterally Back: symmetric, no curvature. ROM normal. No CVA tenderness. Cardio: regular rate and rhythm, S1, S2 normal, no murmur, click, rub or gallop GI: soft, non-tender; bowel sounds normal; no masses,  no organomegaly Extremities: extremities normal, atraumatic, no cyanosis or edema  ECOG PERFORMANCE STATUS: 0 - Asymptomatic  Blood pressure 134/86, pulse 60, temperature 97.9 F (36.6 C), temperature source Oral, resp. rate 18, weight 185 lb 3.2 oz (84 kg), SpO2 100 %.  LABORATORY DATA: Lab Results  Component Value Date   WBC 4.0 08/23/2017   HGB 13.1 08/23/2017   HCT 38.1 (L) 08/23/2017   MCV 95.7 08/23/2017   PLT 150 08/23/2017      Chemistry      Component Value Date/Time   NA 141 08/23/2017 1545   K 3.8 08/23/2017 1545   CL 106 01/07/2016 0816   CL 108 (H) 01/19/2013 1206   CO2 26 08/23/2017 1545   BUN 20.2 08/23/2017 1545   CREATININE 1.3 08/23/2017 1545   GLU 97 01/17/2015      Component  Value Date/Time   CALCIUM 9.3 08/23/2017 1545   ALKPHOS 100 08/23/2017 1545   AST 33 08/23/2017 1545   ALT 32 08/23/2017 1545   BILITOT 0.54 08/23/2017 1545       RADIOGRAPHIC STUDIES: [place] Limited Joint Space Structures Low Right(no Linked Charges)  Result Date: 08/12/2017 Gerda Diss, DO     08/12/2017 11:37 PM LIMITED MSK ULTRASOUND OF Right Achilles Images were obtained and interpreted by myself, Teresa Coombs, DO  Images have been saved and stored to PACS system. Images obtained on: GE S7 Ultrasound machine FINDINGS:  Achilles insertion at the calcaneus is normal appearing.  There is significant fusiform swelling of the avascular zone of the Achilles with actual noticeable difference in the fascicles structure with the ability to trace fascicles through the tendon compared to prior where it was poorly defined.  There is marked increased neovascularity compared to last visit  Tendon measurement is slightly smaller 1.16 today IMPRESSION: 1. Healing Achilles tendinosis with marked increased neovascularity suggesting overall healing.    ASSESSMENT AND PLAN:  This is a very pleasant 51 years old white male with chronic myeloid leukemia diagnosed in February 2009. The patient has been on treatment with Gleevec 400 mg by mouth daily. The patient continues to tolerate his treatment fairly well with no significant adverse effects. CBC and comprehensive metabolic panel are unremarkable today. Molecular study for BCR/ABL are still pending. I recommended for the patient to continue his current treatment with Gleevec. I will see him back for follow-up visit in 6 months for evaluation and repeat blood work including CBC, comprehensive metabolic panel and LDH. The patient was advised to call immediately if he has any concerning symptoms in the interval. The patient voices understanding of current disease status and treatment options and is in agreement with the current care plan. All questions were answered. The patient knows to call the clinic with any problems, questions or concerns. We can certainly see the patient much sooner if necessary. I spent 10 minutes counseling the patient face to face. The total time spent in the appointment was 15 minutes.  Disclaimer: This note was dictated with voice recognition software. Similar sounding words can inadvertently be transcribed and may not be corrected upon review.

## 2017-08-23 NOTE — Telephone Encounter (Signed)
Gave patient AVS and calendar of upcoming March appointments.  °

## 2017-08-24 LAB — LACTATE DEHYDROGENASE: LDH: 257 U/L — ABNORMAL HIGH (ref 125–245)

## 2017-08-26 LAB — BCR-ABL1, CML/ALL, PCR, QUANT: E14A2 (B3A2) TRANSCRIPT: 0.0068 %

## 2017-08-29 ENCOUNTER — Other Ambulatory Visit: Payer: Self-pay | Admitting: Internal Medicine

## 2017-08-29 DIAGNOSIS — C9210 Chronic myeloid leukemia, BCR/ABL-positive, not having achieved remission: Secondary | ICD-10-CM

## 2017-09-08 ENCOUNTER — Encounter: Payer: Self-pay | Admitting: Internal Medicine

## 2017-09-23 ENCOUNTER — Ambulatory Visit (INDEPENDENT_AMBULATORY_CARE_PROVIDER_SITE_OTHER): Payer: BLUE CROSS/BLUE SHIELD | Admitting: Sports Medicine

## 2017-09-23 ENCOUNTER — Ambulatory Visit: Payer: Self-pay

## 2017-09-23 ENCOUNTER — Encounter: Payer: Self-pay | Admitting: Sports Medicine

## 2017-09-23 ENCOUNTER — Ambulatory Visit (INDEPENDENT_AMBULATORY_CARE_PROVIDER_SITE_OTHER): Payer: BLUE CROSS/BLUE SHIELD

## 2017-09-23 VITALS — BP 128/84 | HR 60 | Ht 70.0 in | Wt 189.0 lb

## 2017-09-23 DIAGNOSIS — M5416 Radiculopathy, lumbar region: Secondary | ICD-10-CM

## 2017-09-23 DIAGNOSIS — M7660 Achilles tendinitis, unspecified leg: Secondary | ICD-10-CM | POA: Diagnosis not present

## 2017-09-23 DIAGNOSIS — M6788 Other specified disorders of synovium and tendon, other site: Secondary | ICD-10-CM

## 2017-09-23 NOTE — Progress Notes (Signed)
OFFICE VISIT NOTE Michael [name]. Michael [name], [place] at [facility] [place] - 51 y.o. male MRN [id number]  Date of birth: 1966/04/13  Visit Date: 09/23/2017  PCP: Marletta Lor, MD   Referred by: Marletta Lor, MD  Thalia Bloodgood PT, LAT, ATC acting as scribe for Dr. Paulla Fore.  SUBJECTIVE:   Chief Complaint  Patient presents with  . Follow-up    Achille's pain   HPI: As below and per problem based documentation when appropriate.  [name] is an established pt here for f/u of R Achille's pain.  He does not note any real improvement in his R Achille's symptoms.  Pt states that once he gets "warmed-up", his R Achille's doesn't bother him that much.  He states that he is almost out of his nitroglycerin patches and he has been using them as prescribed.  He states that he is doing his Alfredson exercises about 6x/week, approximately 25-40/day.  He notes that he is also wearing his Body Helix ankle sleeve daily.    Pt also wants to discuss another issue.  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  He reports having L LE weakness at that time due to the HNP but states that his strength returned after PT.  He states that he has no patellar reflex on the L side and also has long-standing paresthesia along the L medial ankle.  He's curious if there's anything else that can be done about those symptoms.    Review of Systems  Constitutional: Negative for chills, fever and weight loss.  HENT: Negative.   Eyes: Negative.   Respiratory: Negative for cough, shortness of breath and wheezing.   Cardiovascular: Negative for chest pain and palpitations.  Gastrointestinal: Negative for abdominal pain, heartburn, nausea and vomiting.  Genitourinary: Negative.   Musculoskeletal: Positive for joint pain. Negative for falls.  Neurological: Positive for tingling. Negative for dizziness and headaches.    Endo/Heme/Allergies: Does not bruise/bleed easily.  Psychiatric/Behavioral: Negative for depression. The patient is not nervous/anxious and does not have insomnia.     Otherwise per HPI.   HISTORY & PERTINENT PRIOR DATA:  Prior History reviewed and updated per electronic medical record.  Significant history, findings, studies and interim changes include:  reports that  has never smoked. he has never used smokeless tobacco. No results for input(s): HGBA1C, LABURIC, CREATINE in the last 8760 hours. No specialty comments available. Problem  Achilles Tendinosis  Lumbar Radiculopathy     OBJECTIVE:  VS:  HT:5\' 10"  (177.8 cm)   WT:189 lb (85.7 kg)  BMI:27.12    BP:128/84  HR:60bpm  TEMP: ( )  RESP:97 %  PHYSICAL EXAM: Constitutional: WDWN, Non-toxic appearing. Psychiatric: Alert & appropriately interactive. Not depressed or anxious appearing. Respiratory: No increased work of breathing. Trachea Midline Eyes: Pupils are equal. EOM intact without nystagmus. No scleral icterus  Achilles overall well aligned with persistent nodular appearance within the avascular zone that is mildly tender.  Dorsiflexion to 105 degrees.  DP and PT pulses 2+/4.  Slight tightness with straight leg raise bilaterally.   ASSESSMENT & PLAN:   1. Lumbar back pain with radiculopathy affecting left lower extremity   2. Achilles tendinosis   3. Lumbar radiculopathy    PLAN:    Achilles tendinosis Continue current treatment regimen with Alfredson exercises, compression, nitroglycerin.   Lumbar radiculopathy We will add therapeutic exercises for his low back.  This  may be a contributing factor with a neurogenic etiology of his tight Achilles.  No red flag symptoms at this time.   ++++++++++++++++++++++++++++++++++++++++++++ Orders & Meds: Orders Placed This Encounter  Procedures  . DG Lumbar Spine Complete  . [place] LIMITED JOINT SPACE STRUCTURES LOW RIGHT(NO LINKED CHARGES)    No orders of the  defined types were placed in this encounter.   ++++++++++++++++++++++++++++++++++++++++++++ Follow-up: Return in about 8 weeks (around 11/18/2017).   Pertinent documentation may be included in additional procedure notes, imaging studies, problem based documentation and patient instructions. Please see these sections of the encounter for additional information regarding this visit. CMA/ATC served as Education administrator during this visit. History, Physical, and Plan performed by medical provider. Documentation and orders reviewed and attested to.      Gerda Diss, [facility] Physician

## 2017-09-23 NOTE — Patient Instructions (Signed)

## 2017-10-28 ENCOUNTER — Telehealth: Payer: Self-pay | Admitting: Internal Medicine

## 2017-10-28 DIAGNOSIS — Z1211 Encounter for screening for malignant neoplasm of colon: Secondary | ICD-10-CM

## 2017-10-28 NOTE — Telephone Encounter (Signed)
Pt is wanting a referral to have a colonoscopy done.

## 2017-10-31 ENCOUNTER — Encounter: Payer: Self-pay | Admitting: Internal Medicine

## 2017-10-31 NOTE — Telephone Encounter (Signed)
Please advise 

## 2017-10-31 NOTE — Telephone Encounter (Signed)
Referral order placed.

## 2017-10-31 NOTE — Telephone Encounter (Signed)
Please refer for colonoscopy

## 2017-11-18 ENCOUNTER — Ambulatory Visit: Payer: BLUE CROSS/BLUE SHIELD | Admitting: Sports Medicine

## 2017-11-21 ENCOUNTER — Telehealth: Payer: Self-pay | Admitting: Pharmacy Technician

## 2017-11-21 NOTE — Telephone Encounter (Signed)
Oral Oncology Patient Advocate Encounter  Received notification from [employer] that prior authorization for [place] is due for renewal.  PA submitted via fax.  [phone]) Status is pending  Oral Oncology Clinic will continue to follow.  Fabio Asa. Melynda Keller, [facility] Patient [place] [phone] 11/21/2017 4:26 PM

## 2017-11-22 ENCOUNTER — Telehealth: Payer: Self-pay | Admitting: *Deleted

## 2017-11-22 DIAGNOSIS — M24574 Contracture, right foot: Secondary | ICD-10-CM | POA: Diagnosis not present

## 2017-11-22 DIAGNOSIS — L03031 Cellulitis of right toe: Secondary | ICD-10-CM | POA: Diagnosis not present

## 2017-11-22 DIAGNOSIS — M79671 Pain in right foot: Secondary | ICD-10-CM | POA: Diagnosis not present

## 2017-11-22 NOTE — Telephone Encounter (Signed)
Fax received from [employer]: Gleevac coverage authorized  Starting 11/21/17-11/21/18

## 2017-11-29 ENCOUNTER — Encounter: Payer: Self-pay | Admitting: Sports Medicine

## 2017-11-29 NOTE — Assessment & Plan Note (Signed)
Continue current treatment regimen with Alfredson exercises, compression, nitroglycerin.

## 2017-11-29 NOTE — Assessment & Plan Note (Signed)
We will add therapeutic exercises for his low back.  This may be a contributing factor with a neurogenic etiology of his tight Achilles.  No red flag symptoms at this time.

## 2017-12-02 NOTE — Telephone Encounter (Signed)
Oral Oncology Patient Advocate Encounter  Prior Authorization for [place] has been approved.    PA# [id number] Effective dates: 11/21/2017 through 11/21/2018  Oral Oncology Clinic will continue to follow.   Fabio Asa. Melynda Keller, [place] Patient [place] [phone] 12/02/2017 10:40 AM

## 2017-12-06 DIAGNOSIS — L03031 Cellulitis of right toe: Secondary | ICD-10-CM | POA: Diagnosis not present

## 2017-12-06 DIAGNOSIS — M24571 Contracture, right ankle: Secondary | ICD-10-CM | POA: Diagnosis not present

## 2017-12-06 DIAGNOSIS — M7661 Achilles tendinitis, right leg: Secondary | ICD-10-CM | POA: Diagnosis not present

## 2017-12-06 DIAGNOSIS — M7731 Calcaneal spur, right foot: Secondary | ICD-10-CM | POA: Diagnosis not present

## 2017-12-06 DIAGNOSIS — M79671 Pain in right foot: Secondary | ICD-10-CM | POA: Diagnosis not present

## 2017-12-06 DIAGNOSIS — L03032 Cellulitis of left toe: Secondary | ICD-10-CM | POA: Diagnosis not present

## 2017-12-26 ENCOUNTER — Other Ambulatory Visit: Payer: Self-pay

## 2017-12-26 ENCOUNTER — Ambulatory Visit (AMBULATORY_SURGERY_CENTER): Payer: Self-pay | Admitting: *Deleted

## 2017-12-26 VITALS — Ht 70.0 in | Wt 191.0 lb

## 2017-12-26 DIAGNOSIS — Z1211 Encounter for screening for malignant neoplasm of colon: Secondary | ICD-10-CM

## 2017-12-26 MED ORDER — NA SULFATE-K SULFATE-MG SULF 17.5-3.13-1.6 GM/177ML PO SOLN: 1.0000 | Freq: Once | ORAL | 0 refills | Status: AC

## 2017-12-26 NOTE — Progress Notes (Signed)
No egg or soy allergy known to patient  No issues with past sedation with any surgeries  or procedures, no intubation problems  No diet pills per patient No home 02 use per patient  No blood thinners per patient  Pt denies issues with constipation  No A fib or A flutter  EMMI video sent to pt's e mail  Gave pt $15 coupon on line for suprep

## 2018-01-03 ENCOUNTER — Encounter: Payer: Self-pay | Admitting: Internal Medicine

## 2018-01-05 DIAGNOSIS — M7661 Achilles tendinitis, right leg: Secondary | ICD-10-CM | POA: Diagnosis not present

## 2018-01-05 DIAGNOSIS — M24571 Contracture, right ankle: Secondary | ICD-10-CM | POA: Diagnosis not present

## 2018-01-09 ENCOUNTER — Other Ambulatory Visit: Payer: Self-pay

## 2018-01-09 ENCOUNTER — Encounter: Payer: Self-pay | Admitting: Internal Medicine

## 2018-01-09 ENCOUNTER — Ambulatory Visit (AMBULATORY_SURGERY_CENTER): Payer: BLUE CROSS/BLUE SHIELD | Admitting: Internal Medicine

## 2018-01-09 VITALS — BP 125/77 | HR 45 | Temp 97.7°F | Resp 14 | Ht 70.0 in | Wt 191.0 lb

## 2018-01-09 DIAGNOSIS — Z1212 Encounter for screening for malignant neoplasm of rectum: Secondary | ICD-10-CM | POA: Diagnosis not present

## 2018-01-09 DIAGNOSIS — Z1211 Encounter for screening for malignant neoplasm of colon: Secondary | ICD-10-CM | POA: Diagnosis not present

## 2018-01-09 HISTORY — PX: COLONOSCOPY: SHX174

## 2018-01-09 MED ORDER — SODIUM CHLORIDE 0.9 % IV SOLN: 500.0000 mL | Freq: Once | INTRAVENOUS | Status: DC

## 2018-01-09 NOTE — Progress Notes (Signed)
Report given to PACU, vss 

## 2018-01-09 NOTE — Op Note (Signed)
[name]: Michael [name] Procedure Date: 01/09/2018 1:15 PM MRN: [id number] Endoscopist: Jerene Bears , MD Age: 52 Referring MD:  Date of Birth: 1966-10-03 Gender: Male Account #: [id number] Procedure:                Colonoscopy Indications:              Screening for colorectal malignant neoplasm, This                            is the patient's first colonoscopy Medicines:                Monitored Anesthesia Care Procedure:                Pre-Anesthesia Assessment:                           - Prior to the procedure, a History and Physical                            was performed, and patient medications and                            allergies were reviewed. The patient's tolerance of                            previous anesthesia was also reviewed. The risks                            and benefits of the procedure and the sedation                            options and risks were discussed with the patient.                            All questions were answered, and informed consent                            was obtained. Prior Anticoagulants: The patient has                            taken no previous anticoagulant or antiplatelet                            agents. ASA Grade Assessment: II - A patient with                            mild systemic disease. After reviewing the risks                            and benefits, the patient was deemed in                            satisfactory condition to undergo the procedure.  After obtaining informed consent, the colonoscope                            was passed under direct vision. Throughout the                            procedure, the patient's blood pressure, pulse, and                            oxygen saturations were monitored continuously. The                            Colonoscope was introduced through the anus and                            advanced to the the cecum,  identified by                            appendiceal orifice and ileocecal valve. The                            colonoscopy was performed without difficulty. The                            patient tolerated the procedure well. The quality                            of the bowel preparation was good. [Anatomical                            Structures] photographed. Scope In: 1:39:33 PM Scope Out: 1:52:42 PM Scope Withdrawal Time: 0 hours 9 minutes 37 seconds  Total Procedure Duration: 0 hours 13 minutes 9 seconds  Findings:                 The digital rectal exam was normal.                           A few small-mouthed diverticula were found in the                            sigmoid colon.                           The exam was otherwise without abnormality on                            direct and retroflexion views. Complications:            No immediate complications. Estimated Blood Loss:     Estimated blood loss: none. Impression:               - Mild diverticulosis in the sigmoid colon.                           - The examination was otherwise normal on direct  and retroflexion views.                           - No specimens collected. Recommendation:           - Patient has a contact number available for                            emergencies. The signs and symptoms of potential                            delayed complications were discussed with the                            patient. Return to normal activities tomorrow.                            Written discharge instructions were provided to the                            patient.                           - Resume previous diet.                           - Continue present medications.                           - Repeat colonoscopy in 10 years for screening                            purposes. Jerene Bears, MD 01/09/2018 1:56:44 PM This report has been signed electronically.

## 2018-01-09 NOTE — Progress Notes (Signed)
Pt's states no medical or surgical changes since previsit or office visit. 

## 2018-01-09 NOTE — Patient Instructions (Signed)
**   Handout given on diverticulosis **   YOU HAD AN ENDOSCOPIC PROCEDURE TODAY AT THE [facility]:   Refer to the procedure report that was given to you for any specific questions about what was found during the examination.  If the procedure report does not answer your questions, please call your gastroenterologist to clarify.  If you requested that your care partner not be given the details of your procedure findings, then the procedure report has been included in a sealed envelope for you to review at your convenience later.  YOU SHOULD EXPECT: Some feelings of bloating in the abdomen. Passage of more gas than usual.  Walking can help get rid of the air that was put into your GI tract during the procedure and reduce the bloating. If you had a lower endoscopy (such as a colonoscopy or flexible sigmoidoscopy) you may notice spotting of blood in your stool or on the toilet paper. If you underwent a bowel prep for your procedure, you may not have a normal bowel movement for a few days.  Please Note:  You might notice some irritation and congestion in your nose or some drainage.  This is from the oxygen used during your procedure.  There is no need for concern and it should clear up in a day or so.  SYMPTOMS TO REPORT IMMEDIATELY:   Following lower endoscopy (colonoscopy or flexible sigmoidoscopy):  Excessive amounts of blood in the stool  Significant tenderness or worsening of abdominal pains  Swelling of the abdomen that is new, acute  Fever of 100F or higher  For urgent or emergent issues, a gastroenterologist can be reached at any hour by calling [phone].   DIET:  We do recommend a small meal at first, but then you may proceed to your regular diet.  Drink plenty of fluids but you should avoid alcoholic beverages for 24 hours.  ACTIVITY:  You should plan to take it easy for the rest of today and you should NOT DRIVE or use heavy machinery until tomorrow (because of the  sedation medicines used during the test).    FOLLOW UP: Our staff will call the number listed on your records the next business day following your procedure to check on you and address any questions or concerns that you may have regarding the information given to you following your procedure. If we do not reach you, we will leave a message.  However, if you are feeling well and you are not experiencing any problems, there is no need to return our call.  We will assume that you have returned to your regular daily activities without incident.  If any biopsies were taken you will be contacted by phone or by letter within the next 1-3 weeks.  Please call us at [phone] if you have not heard about the biopsies in 3 weeks.    SIGNATURES/CONFIDENTIALITY: You and/or your care partner have signed paperwork which will be entered into your electronic medical record.  These signatures attest to the fact that that the information above on your After Visit Summary has been reviewed and is understood.  Full responsibility of the confidentiality of this discharge information lies with you and/or your care-partner. 

## 2018-01-10 ENCOUNTER — Telehealth: Payer: Self-pay | Admitting: *Deleted

## 2018-01-10 ENCOUNTER — Telehealth: Payer: Self-pay

## 2018-01-10 NOTE — Telephone Encounter (Signed)
Left message

## 2018-01-10 NOTE — Telephone Encounter (Signed)
  Follow up Call-  Call back number 01/09/2018  Post procedure Call Back phone  # [phone]  Permission to leave phone message Yes  Some recent data might be hidden     Patient questions:  Do you have a fever, pain , or abdominal swelling? No. Pain Score  0 *  Have you tolerated food without any problems? Yes.    Have you been able to return to your normal activities? Yes.    Do you have any questions about your discharge instructions: Diet   No. Medications  No. Follow up visit  No.  Do you have questions or concerns about your Care? No.  Actions: * If pain score is 4 or above: No action needed, pain <4.

## 2018-02-07 DIAGNOSIS — M7661 Achilles tendinitis, right leg: Secondary | ICD-10-CM | POA: Diagnosis not present

## 2018-02-07 DIAGNOSIS — M24571 Contracture, right ankle: Secondary | ICD-10-CM | POA: Diagnosis not present

## 2018-02-27 ENCOUNTER — Telehealth: Payer: Self-pay | Admitting: Internal Medicine

## 2018-02-27 ENCOUNTER — Inpatient Hospital Stay: Payer: BLUE CROSS/BLUE SHIELD

## 2018-02-27 ENCOUNTER — Inpatient Hospital Stay: Payer: BLUE CROSS/BLUE SHIELD | Attending: Internal Medicine | Admitting: Internal Medicine

## 2018-02-27 ENCOUNTER — Encounter: Payer: Self-pay | Admitting: Internal Medicine

## 2018-02-27 VITALS — BP 147/94 | HR 62 | Temp 98.6°F | Resp 14 | Ht 70.0 in | Wt 192.7 lb

## 2018-02-27 DIAGNOSIS — C9210 Chronic myeloid leukemia, BCR/ABL-positive, not having achieved remission: Secondary | ICD-10-CM | POA: Diagnosis not present

## 2018-02-27 DIAGNOSIS — C9110 Chronic lymphocytic leukemia of B-cell type not having achieved remission: Secondary | ICD-10-CM | POA: Insufficient documentation

## 2018-02-27 LAB — COMPREHENSIVE METABOLIC PANEL
ALBUMIN: 4.4 g/dL (ref 3.5–5.0)
ALT: 37 U/L (ref 0–55)
AST: 38 U/L — AB (ref 5–34)
Alkaline Phosphatase: 88 U/L (ref 40–150)
Anion gap: 7 (ref 3–11)
BUN: 16 mg/dL (ref 7–26)
CHLORIDE: 107 mmol/L (ref 98–109)
CO2: 26 mmol/L (ref 22–29)
Calcium: 9.6 mg/dL (ref 8.4–10.4)
Creatinine, Ser: 1.21 mg/dL (ref 0.70–1.30)
GFR calc Af Amer: >60 mL/min (ref >60)
GFR calc non Af Amer: >60 mL/min (ref >60)
GLUCOSE: 94 mg/dL (ref 70–140)
POTASSIUM: 4.2 mmol/L (ref 3.5–5.1)
SODIUM: 140 mmol/L (ref 136–145)
Total Bilirubin: 0.6 mg/dL (ref 0.2–1.2)
Total Protein: 7.2 g/dL (ref 6.4–8.3)

## 2018-02-27 LAB — CBC WITH DIFFERENTIAL/PLATELET
BASOS ABS: 0 10*3/uL (ref 0.0–0.1)
BASOS PCT: 1 %
EOS PCT: 1 %
Eosinophils Absolute: 0 10*3/uL (ref 0.0–0.5)
HCT: 40.5 % (ref 38.4–49.9)
Hemoglobin: 13.6 g/dL (ref 13.0–17.1)
LYMPHS PCT: 30 %
Lymphs Abs: 1.3 10*3/uL (ref 0.9–3.3)
MCH: 33 pg (ref 27.2–33.4)
MCHC: 33.7 g/dL (ref 32.0–36.0)
MCV: 97.8 fL (ref 79.3–98.0)
Monocytes Absolute: 0.3 10*3/uL (ref 0.1–0.9)
Monocytes Relative: 7 %
Neutro Abs: 2.8 10*3/uL (ref 1.5–6.5)
Neutrophils Relative %: 61 %
Platelets: 171 10*3/uL (ref 140–400)
RBC: 4.14 MIL/uL — AB (ref 4.20–5.82)
RDW: 13.1 % (ref 11.0–14.6)
WBC: 4.5 10*3/uL (ref 4.0–10.3)

## 2018-02-27 LAB — LACTATE DEHYDROGENASE: LDH: 503 U/L — AB (ref 125–245)

## 2018-02-27 NOTE — Progress Notes (Signed)
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  PRIOR THERAPY: None  CURRENT THERAPY: Gleevec 400 mg by mouth daily started February 2009  INTERVAL HISTORY: Michael [name] 52 y.o. male returns to the clinic today for 6 months follow-up visit.  The patient is feeling fine today with no specific complaints.  He continues to be very active and he plays golf at regular basis.  He denied having any chest pain, shortness of breath, cough or hemoptysis.  He denied having any fever or chills.  He has no nausea, vomiting, diarrhea or constipation.  He continues to tolerate his treatment with Gleevec fairly well.  He is here today for evaluation and repeat blood work.  MEDICAL HISTORY: Past Medical History:  Diagnosis Date  . Anal fistula   . 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 Leukocytosis, unspecified   . Liver cyst    Left hepatic dome 2 cm cyst, CT Sep2013  . Pruritus ani, intermittent 01/17/2013  . Splenomegaly    resolved CT Sep2013    ALLERGIES:  has No Known Allergies.  MEDICATIONS:  Current Outpatient Medications  Medication Sig Dispense Refill  . diclofenac sodium (VOLTAREN) 1 % GEL 1 GRAM 3 TIMES A DAY FOR 30 DAYS APPLY TO AFFECTED AREA  1  . imatinib (GLEEVEC) 400 MG tablet TAKE 1 TABLET BY MOUTH ONCE DAILY AT THE SAME TIME WITH FOOD AND A LARGE GLASS OF WATER. DO NOT CRUSH TABLETS. AVOID GRAPEFRUIT PRODUCTS. 90 tablet 0  . nitroGLYCERIN (NITRODUR - DOSED IN MG/24 HR) 0.2 mg/hr patch Place 1/4 of patch over affected region. Remove and replace once daily.  Slightly alter skin placement daily (Patient not taking: Reported on 01/09/2018) 30 patch 1   No current facility-administered medications for this visit.     SURGICAL HISTORY:  Past  Surgical History:  Procedure Laterality Date  . REFRACTIVE SURGERY  2000  . TOENAIL EXCISION  01/16/2013   ingrown toenail- not removed   . TONSILECTOMY, ADENOIDECTOMY, BILATERAL MYRINGOTOMY AND TUBES     age 20    REVIEW OF SYSTEMS:  A comprehensive review of systems was negative.   PHYSICAL EXAMINATION: General appearance: alert, cooperative and no distress Head: Normocephalic, without obvious abnormality, atraumatic Neck: no adenopathy, no JVD, supple, symmetrical, trachea midline and thyroid not enlarged, symmetric, no tenderness/mass/nodules Lymph nodes: Cervical, supraclavicular, and axillary nodes normal. Resp: clear to auscultation bilaterally Back: symmetric, no curvature. ROM normal. No CVA tenderness. Cardio: regular rate and rhythm, S1, S2 normal, no murmur, click, rub or gallop GI: soft, non-tender; bowel sounds normal; no masses,  no organomegaly Extremities: extremities normal, atraumatic, no cyanosis or edema  ECOG PERFORMANCE STATUS: 0 - Asymptomatic  Blood pressure (!) 147/94, pulse 62, temperature 98.6 F (37 C), temperature source Oral, resp. rate 14, height 5\' 10"  (1.778 m), weight 192 lb 11.2 oz (87.4 kg), SpO2 100 %.  LABORATORY DATA: Lab Results  Component Value Date   WBC 4.0 08/23/2017   HGB 13.1 08/23/2017   HCT 38.1 (L) 08/23/2017   MCV 95.7 08/23/2017   PLT 150 08/23/2017      Chemistry      Component Value Date/Time   NA 141 08/23/2017 1545   K 3.8 08/23/2017 1545   CL 106 01/07/2016 0816  CL 108 (H) 01/19/2013 1206   CO2 26 08/23/2017 1545   BUN 20.2 08/23/2017 1545   CREATININE 1.3 08/23/2017 1545   GLU 97 01/17/2015      Component Value Date/Time   CALCIUM 9.3 08/23/2017 1545   ALKPHOS 100 08/23/2017 1545   AST 33 08/23/2017 1545   ALT 32 08/23/2017 1545   BILITOT 0.54 08/23/2017 1545       RADIOGRAPHIC STUDIES: No results found.  ASSESSMENT AND PLAN:  This is a very pleasant 52 years old white male with chronic myeloid  leukemia diagnosed in February 2009. The patient has been on treatment with Gleevec 400 mg by mouth daily. The patient is doing fine today with no concerning complaints.  He is tolerating his treatment with Gleevec fairly well. I recommended for him to continue his current treatment with Gleevec with the same dose. I will see him back for follow-up visit in 6 months for evaluation after repeating CBC, complaints metabolic panel, LDH and molecular study for BCR/ABL. He was advised to call immediately if he has any concerning symptoms in the interval. The patient voices understanding of current disease status and treatment options and is in agreement with the current care plan. All questions were answered. The patient knows to call the clinic with any problems, questions or concerns. We can certainly see the patient much sooner if necessary. I spent 10 minutes counseling the patient face to face. The total time spent in the appointment was 15 minutes.  Disclaimer: This note was dictated with voice recognition software. Similar sounding words can inadvertently be transcribed and may not be corrected upon review.

## 2018-02-27 NOTE — Telephone Encounter (Signed)
Appointments scheduled Patient declined AVS/Calendar / per 3/11 los

## 2018-03-08 DIAGNOSIS — M24571 Contracture, right ankle: Secondary | ICD-10-CM | POA: Diagnosis not present

## 2018-03-08 DIAGNOSIS — M7661 Achilles tendinitis, right leg: Secondary | ICD-10-CM | POA: Diagnosis not present

## 2018-03-13 ENCOUNTER — Other Ambulatory Visit: Payer: Self-pay | Admitting: Internal Medicine

## 2018-03-13 DIAGNOSIS — C9210 Chronic myeloid leukemia, BCR/ABL-positive, not having achieved remission: Secondary | ICD-10-CM

## 2018-05-10 IMAGING — DX DG LUMBAR SPINE COMPLETE 4+V
4 series · 4 of 4 positions shown · non-contrast
Comparison: None.

CLINICAL DATA: Pt c/o Lt lower back discomfort with LT lower
extremity numbness and weakness since 9681 when Pt herniated a disc
while doing [employer] [place] swings. No recent injury. Pt did go through
physical therapy after initial injury.

EXAM:
LUMBAR SPINE - COMPLETE 4+ VIEW

[lumbar spine ap]
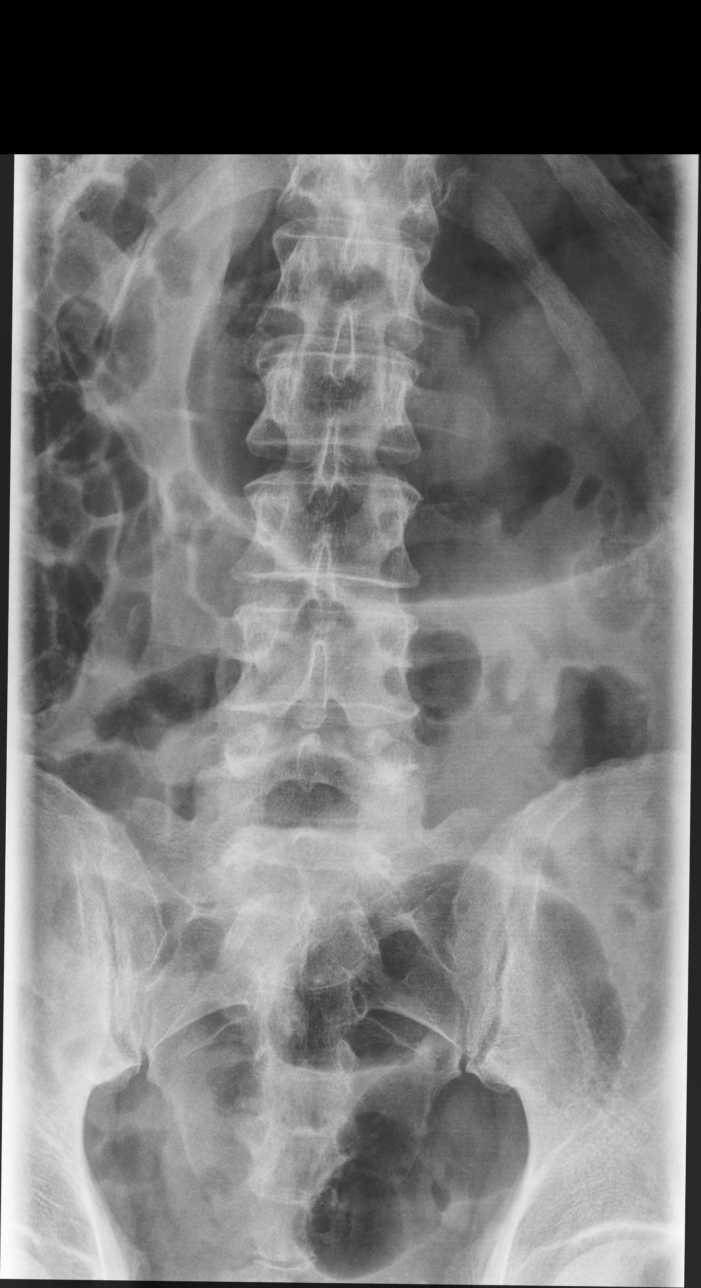

[lumbar spine oblique (1 of 2)]
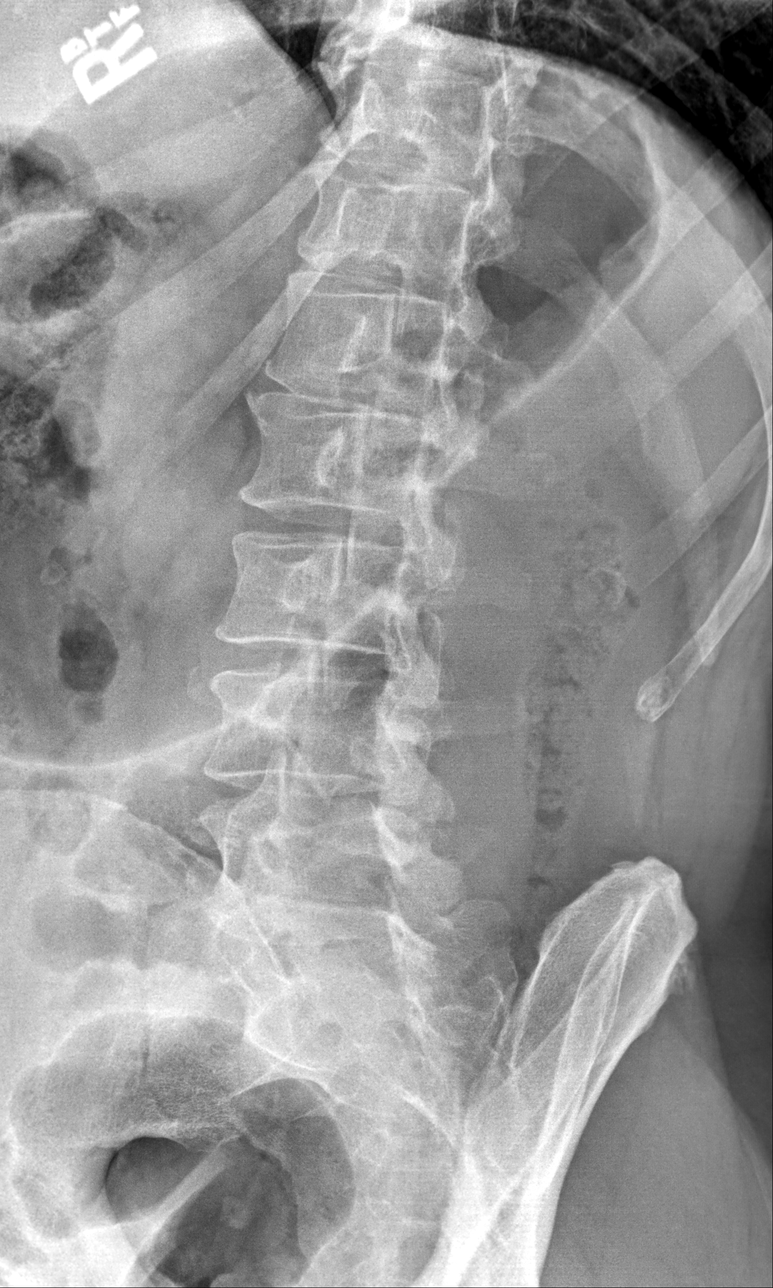

[lumbar spine oblique (2 of 2)]
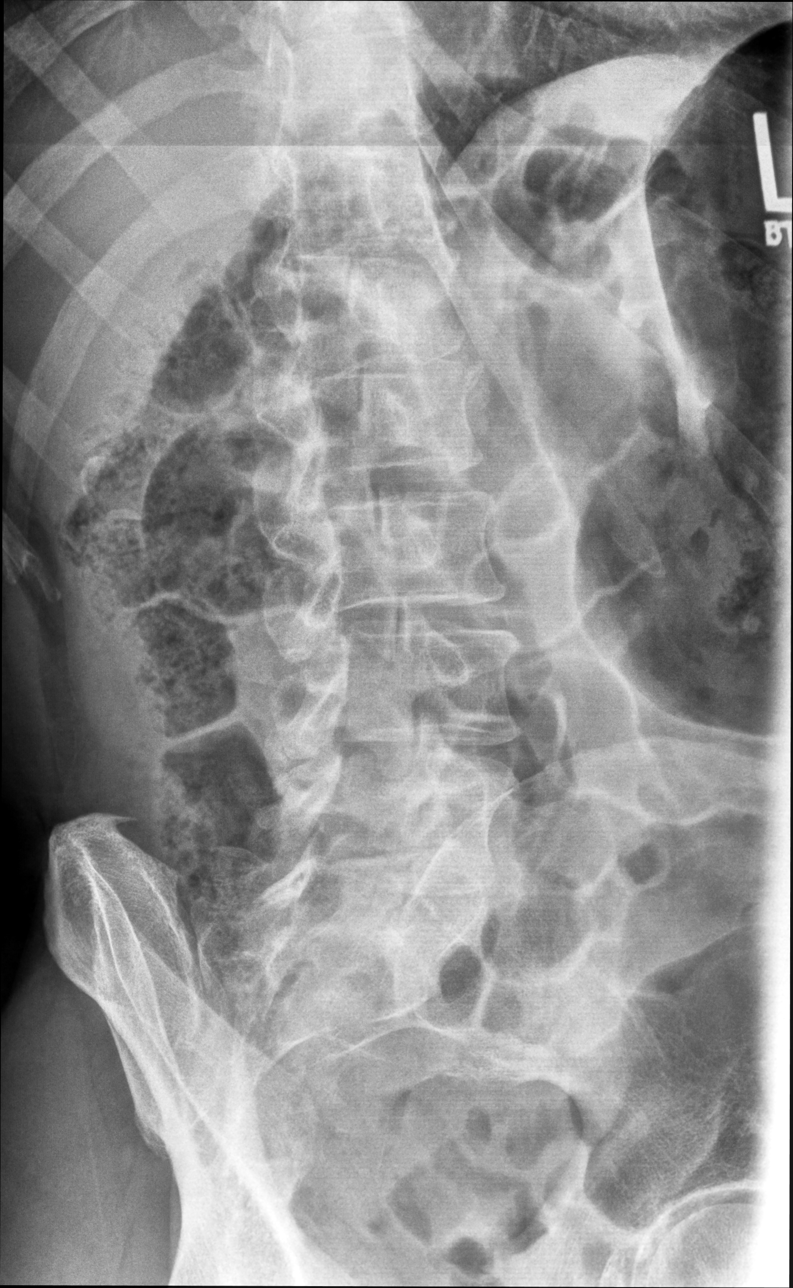

[lumbar spine lat]
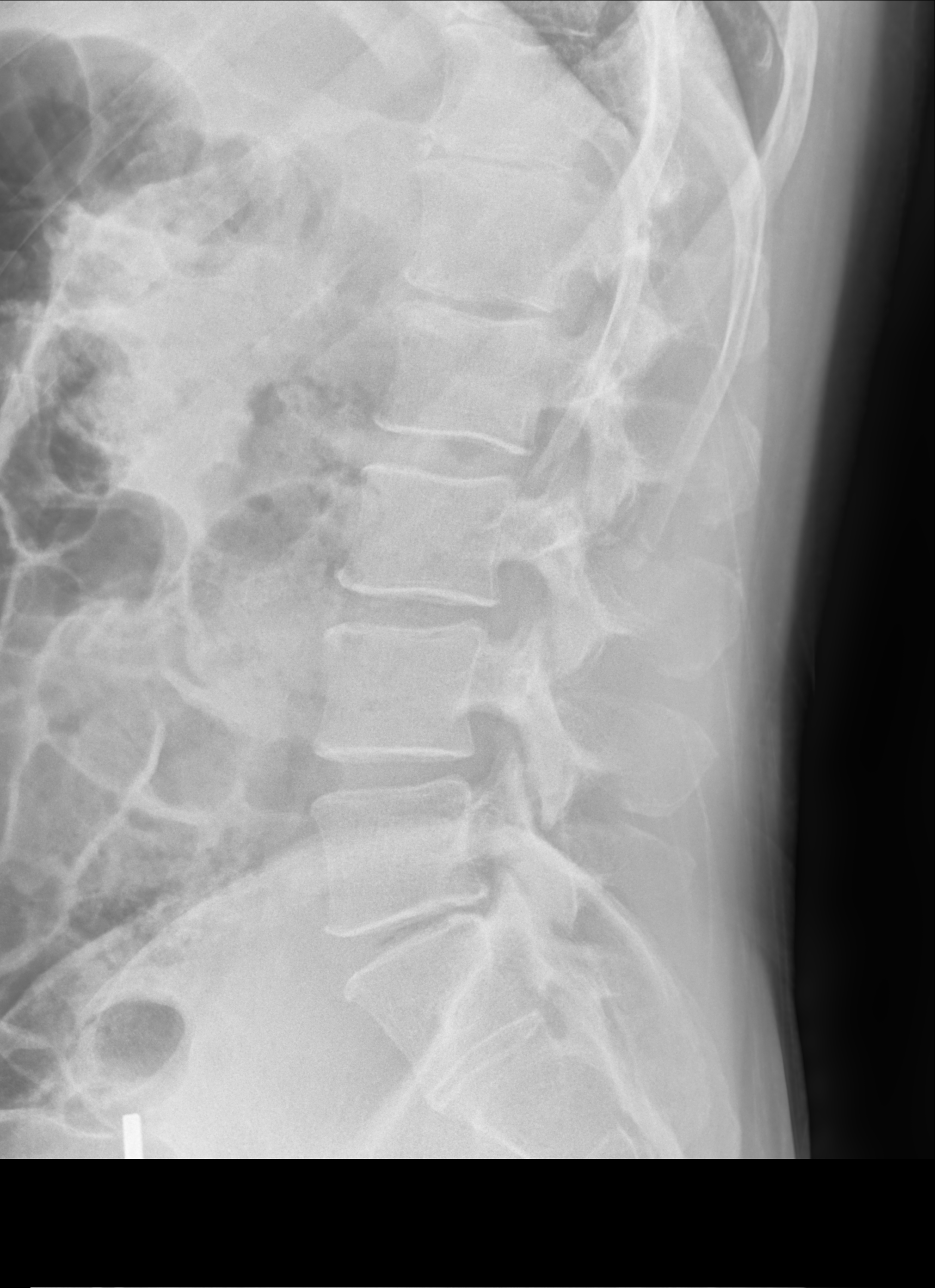

[4 of 4 positions shown; findings below may reference images not displayed]

FINDINGS: No fracture, bone lesion or spondylolisthesis.

Minor loss of disc height L3-L4. Mild loss disc height at L4-L5 and
L5-S1. Remaining disc spaces are well preserved. Facet joints are
well preserved.

Soft tissues are unremarkable.
IMPRESSION: No fracture or acute finding. Mild degenerative changes as
described.

## 2018-07-05 ENCOUNTER — Encounter: Payer: Self-pay | Admitting: Family Medicine

## 2018-07-05 ENCOUNTER — Ambulatory Visit: Payer: BLUE CROSS/BLUE SHIELD | Admitting: Family Medicine

## 2018-07-05 VITALS — BP 136/76 | HR 72 | Temp 98.2°F | Resp 16 | Ht 71.0 in | Wt 193.1 lb

## 2018-07-05 DIAGNOSIS — R030 Elevated blood-pressure reading, without diagnosis of hypertension: Secondary | ICD-10-CM | POA: Diagnosis not present

## 2018-07-05 NOTE — Patient Instructions (Signed)
DASH Eating Plan DASH stands for "Dietary Approaches to Stop Hypertension." The DASH eating plan is a healthy eating plan that has been shown to reduce high blood pressure (hypertension). It may also reduce your risk for type 2 diabetes, heart disease, and stroke. The DASH eating plan may also help with weight loss. What are tips for following this plan? General guidelines  Avoid eating more than 2,300 mg (milligrams) of salt (sodium) a day. If you have hypertension, you may need to reduce your sodium intake to 1,500 mg a day.  Limit alcohol intake to no more than 1 drink a day for nonpregnant women and 2 drinks a day for men. One drink equals 12 oz of beer, 5 oz of wine, or 1 oz of hard liquor.  Work with your health care provider to maintain a healthy body weight or to lose weight. Ask what an ideal weight is for you.  Get at least 30 minutes of exercise that causes your heart to beat faster (aerobic exercise) most days of the week. Activities may include walking, swimming, or biking.  Work with your health care provider or diet and nutrition specialist (dietitian) to adjust your eating plan to your individual calorie needs. Reading food labels  Check food labels for the amount of sodium per serving. Choose foods with less than 5 percent of the Daily Value of sodium. Generally, foods with less than 300 mg of sodium per serving fit into this eating plan.  To find whole grains, look for the word "whole" as the first word in the ingredient list. Shopping  Buy products labeled as "low-sodium" or "no salt added."  Buy fresh foods. Avoid canned foods and premade or frozen meals. Cooking  Avoid adding salt when cooking. Use salt-free seasonings or herbs instead of table salt or sea salt. Check with your health care provider or pharmacist before using salt substitutes.  Do not fry foods. Cook foods using healthy methods such as baking, boiling, grilling, and broiling instead.  Cook with  heart-healthy oils, such as olive, canola, soybean, or sunflower oil. Meal planning   Eat a balanced diet that includes: ? 5 or more servings of fruits and vegetables each day. At each meal, try to fill half of your plate with fruits and vegetables. ? Up to 6-8 servings of whole grains each day. ? Less than 6 oz of lean meat, poultry, or fish each day. A 3-oz serving of meat is about the same size as a deck of cards. One egg equals 1 oz. ? 2 servings of low-fat dairy each day. ? A serving of nuts, seeds, or beans 5 times each week. ? Heart-healthy fats. Healthy fats called Omega-3 fatty acids are found in foods such as flaxseeds and coldwater fish, like sardines, salmon, and mackerel.  Limit how much you eat of the following: ? Canned or prepackaged foods. ? Food that is high in trans fat, such as fried foods. ? Food that is high in saturated fat, such as fatty meat. ? Sweets, desserts, sugary drinks, and other foods with added sugar. ? Full-fat dairy products.  Do not salt foods before eating.  Try to eat at least 2 vegetarian meals each week.  Eat more home-cooked food and less restaurant, buffet, and fast food.  When eating at a restaurant, ask that your food be prepared with less salt or no salt, if possible. What foods are recommended? The items listed may not be a complete list. Talk with your dietitian about what   dietary choices are best for you. Grains Whole-grain or whole-wheat bread. Whole-grain or whole-wheat pasta. Brown rice. Oatmeal. Quinoa. Bulgur. Whole-grain and low-sodium cereals. Pita bread. Low-fat, low-sodium crackers. Whole-wheat flour tortillas. Vegetables Fresh or frozen vegetables (raw, steamed, roasted, or grilled). Low-sodium or reduced-sodium tomato and vegetable juice. Low-sodium or reduced-sodium tomato sauce and tomato paste. Low-sodium or reduced-sodium canned vegetables. Fruits All fresh, dried, or frozen fruit. Canned fruit in natural juice (without  added sugar). Meat and other protein foods Skinless chicken or turkey. Ground chicken or turkey. Pork with fat trimmed off. Fish and seafood. Egg whites. Dried beans, peas, or lentils. Unsalted nuts, nut butters, and seeds. Unsalted canned beans. Lean cuts of beef with fat trimmed off. Low-sodium, lean deli meat. Dairy Low-fat (1%) or fat-free (skim) milk. Fat-free, low-fat, or reduced-fat cheeses. Nonfat, low-sodium ricotta or cottage cheese. Low-fat or nonfat yogurt. Low-fat, low-sodium cheese. Fats and oils Soft margarine without trans fats. Vegetable oil. Low-fat, reduced-fat, or light mayonnaise and salad dressings (reduced-sodium). Canola, safflower, olive, soybean, and sunflower oils. Avocado. Seasoning and other foods Herbs. Spices. Seasoning mixes without salt. Unsalted popcorn and pretzels. Fat-free sweets. What foods are not recommended? The items listed may not be a complete list. Talk with your dietitian about what dietary choices are best for you. Grains Baked goods made with fat, such as croissants, muffins, or some breads. Dry pasta or rice meal packs. Vegetables Creamed or fried vegetables. Vegetables in a cheese sauce. Regular canned vegetables (not low-sodium or reduced-sodium). Regular canned tomato sauce and paste (not low-sodium or reduced-sodium). Regular tomato and vegetable juice (not low-sodium or reduced-sodium). Pickles. Olives. Fruits Canned fruit in a light or heavy syrup. Fried fruit. Fruit in cream or butter sauce. Meat and other protein foods Fatty cuts of meat. Ribs. Fried meat. Bacon. Sausage. Bologna and other processed lunch meats. Salami. Fatback. Hotdogs. Bratwurst. Salted nuts and seeds. Canned beans with added salt. Canned or smoked fish. Whole eggs or egg yolks. Chicken or turkey with skin. Dairy Whole or 2% milk, cream, and half-and-half. Whole or full-fat cream cheese. Whole-fat or sweetened yogurt. Full-fat cheese. Nondairy creamers. Whipped toppings.  Processed cheese and cheese spreads. Fats and oils Butter. Stick margarine. Lard. Shortening. Ghee. Bacon fat. Tropical oils, such as coconut, palm kernel, or palm oil. Seasoning and other foods Salted popcorn and pretzels. Onion salt, garlic salt, seasoned salt, table salt, and sea salt. Worcestershire sauce. Tartar sauce. Barbecue sauce. Teriyaki sauce. Soy sauce, including reduced-sodium. Steak sauce. Canned and packaged gravies. Fish sauce. Oyster sauce. Cocktail sauce. Horseradish that you find on the shelf. Ketchup. Mustard. Meat flavorings and tenderizers. Bouillon cubes. Hot sauce and Tabasco sauce. Premade or packaged marinades. Premade or packaged taco seasonings. Relishes. Regular salad dressings. Where to find more information:  National Heart, Lung, and Blood Institute: www.nhlbi.nih.gov  American Heart Association: www.heart.org Summary  The DASH eating plan is a healthy eating plan that has been shown to reduce high blood pressure (hypertension). It may also reduce your risk for type 2 diabetes, heart disease, and stroke.  With the DASH eating plan, you should limit salt (sodium) intake to 2,300 mg a day. If you have hypertension, you may need to reduce your sodium intake to 1,500 mg a day.  When on the DASH eating plan, aim to eat more fresh fruits and vegetables, whole grains, lean proteins, low-fat dairy, and heart-healthy fats.  Work with your health care provider or diet and nutrition specialist (dietitian) to adjust your eating plan to your individual   calorie needs. This information is not intended to replace advice given to you by your health care provider. Make sure you discuss any questions you have with your health care provider. Document Released: 11/25/2011 Document Revised: 11/29/2016 Document Reviewed: 11/29/2016 Elsevier Interactive Patient Education  2018 Elsevier Inc.  

## 2018-07-05 NOTE — Progress Notes (Signed)
Office Note 07/05/2018  CC:  Chief Complaint  Patient presents with  . Transfer of Care  . Hypertension    ?, has family history, hears heart beat in left ear   Michael [name] is a 52 y.o. male who is here to establish care, transferring from Dr. Nonie Hoyer at [facility] b/c he is retiring. Patient's most recent primary MD: noted above. Old records in EPIC/[facility] EMR reviewed prior to or during today's visit.  Feels well.  He is an avid golfer-->shot 65 in [place] amateur, finished 17th! Admits he doesn't eat as well as he should. Works out regularly.  Dx'd in 2009 with CML, gets followed q50mo now.  He is concerned about his bp: higher than used to be.  Avg mid 130s over mid 80s, used to be 120/80.  Sister and father with HTN.  Review of EMR bp's over the last 2 years shows avg of [id number] systolic, mid 50N diastolic, with one bp reading of 147/94.  Exercise: golfs 2 times per week.  Goes to gym for toning/strength 5 days a week. Cardio: fast walking x 1.64mi one time per week. Walks 1 mile a day with dog. Feels pulse in L ear on and off.  Past Medical History:  Diagnosis Date  . Anal fistula   . 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 CML (chronic myelocytic leukemia) ([place])   . History of chicken pox   . Leukocytosis, unspecified   . Liver cyst    Left hepatic dome 2 cm cyst, CT Sep2013  . Pruritus ani, intermittent 01/17/2013  . Splenomegaly    resolved CT Sep2013    Past Surgical History:  Procedure Laterality Date  . COLONOSCOPY  01/09/2018   mild sigmoid diverticulosis, otherwise normal.  Recall 10 yrs.  [name] REFRACTIVE SURGERY  2000  . TOENAIL EXCISION  01/16/2013   ingrown toenail- not removed   . TONSILECTOMY, ADENOIDECTOMY, BILATERAL MYRINGOTOMY AND TUBES     age 64  . TREATMENT FISTULA ANAL  2015   Anal fistula surgically repaired.    Family History  Problem Relation Age of Onset  . Heart disease Mother        died  from a  reaction to heparin   . Cancer Father 107       prostate  . Prostate cancer Father   . Heart disease Father   . Pulmonary fibrosis Father   . Colon cancer Neg Hx   . Colon polyps Neg Hx     Social History   Socioeconomic History  . Marital status: Married    Spouse name: Not on file  . Number of children: Not on file  . Years of education: Not on file  . Highest education level: Not on file  Occupational History  . Not on file  Social Needs  . Financial resource strain: Not on file  . Food insecurity:    Worry: Not on file    Inability: Not on file  . Transportation needs:    Medical: Not on file    Non-medical: Not on file  Tobacco Use  . Smoking status: Never Smoker  . Smokeless tobacco: Never Used  Substance and Sexual Activity  . Alcohol use: Yes    Comment: occasional   . Drug use: No  . Sexual activity: Not on file  Lifestyle  . Physical activity:    Days per week: Not on file    Minutes per session: Not on file  .  Stress: Not on file  Relationships  . Social connections:    Talks on phone: Not on file    Gets together: Not on file    Attends religious service: Not on file    Active member of club or organization: Not on file    Attends meetings of clubs or organizations: Not on file    Relationship status: Not on file  . Intimate partner violence:    Fear of current or ex partner: Not on file    Emotionally abused: Not on file    Physically abused: Not on file    Forced sexual activity: Not on file  Other Topics Concern  . Not on file  Social History Narrative   Married, 1 son and 1 daughter.   Orig from [place].   Grad from [employer]   Scratch golfer.   No T/A/Ds.    Outpatient Encounter Medications as of 07/05/2018  Medication Sig  . imatinib (GLEEVEC) 400 MG tablet TAKE 1 TABLET BY MOUTH ONCE DAILY AT THE SAME TIME WITH FOOD AND A LARGE GLASS OF WATER. DO NOT CRUSH TABLETS. AVOID GRAPEFRUIT PRODUCTS.  . [DISCONTINUED]  diclofenac sodium (VOLTAREN) 1 % GEL 1 GRAM 3 TIMES A DAY FOR 30 DAYS APPLY TO AFFECTED AREA  . [DISCONTINUED] nitroGLYCERIN (NITRODUR - DOSED IN MG/24 HR) 0.2 mg/hr patch Place 1/4 of patch over affected region. Remove and replace once daily.  Slightly alter skin placement daily (Patient not taking: Reported on 01/09/2018)   No facility-administered encounter medications on file as of 07/05/2018.     No Known Allergies  ROS Review of Systems  Constitutional: Negative for fatigue and fever.  HENT: Negative for congestion, ear pain, facial swelling, hearing loss and sore throat.   Eyes: Negative for visual disturbance.  Respiratory: Negative for cough.   Cardiovascular: Negative for chest pain.  Gastrointestinal: Negative for abdominal pain and nausea.  Genitourinary: Negative for dysuria.  Musculoskeletal: Negative for back pain and joint swelling.  Skin: Negative for rash.  Neurological: Negative for weakness and headaches.  Hematological: Negative for adenopathy.  Psychiatric/Behavioral: Negative for dysphoric mood and sleep disturbance. The patient is not nervous/anxious.     PE; Blood pressure 136/76, pulse 72, temperature 98.2 F (36.8 C), temperature source Oral, resp. rate 16, height 5\' 11"  (1.803 m), weight 193 lb 2 oz (87.6 kg), SpO2 99 %. Body mass index is 26.94 kg/m.  Gen: Alert, well appearing.  Patient is oriented to person, place, time, and situation. AFFECT: pleasant, lucid thought and speech. ENT: Ears: EACs clear, normal epithelium.  TMs with good light reflex and landmarks bilaterally.  Eyes: no injection, icteris, swelling, or exudate.  EOMI, PERRLA. Nose: no drainage or turbinate edema/swelling.  No injection or focal lesion.  Mouth: lips without lesion/swelling.  Oral mucosa pink and moist.  Dentition intact and without obvious caries or gingival swelling.  Oropharynx without erythema, exudate, or swelling.  Neck: supple/nontender.  No LAD, mass, or TM.  Carotid  pulses 2+ bilaterally, without bruits. CV: RRR, no m/r/g.   LUNGS: CTA bilat, nonlabored resps, good aeration in all lung fields. ABD: soft, NT/ND EXT: no clubbing, cyanosis, or edema.  Skin - no sores or suspicious lesions or rashes or color changes  Pertinent labs:  Lab Results  Component Value Date   TSH 1.39 01/07/2016   Lab Results  Component Value Date   WBC 4.5 02/27/2018   HGB 13.6 02/27/2018   HCT 40.5 02/27/2018   MCV 97.8 02/27/2018  PLT 171 02/27/2018   Lab Results  Component Value Date   CREATININE 1.21 02/27/2018   BUN 16 02/27/2018   NA 140 02/27/2018   K 4.2 02/27/2018   CL 107 02/27/2018   CO2 26 02/27/2018   Lab Results  Component Value Date   ALT 37 02/27/2018   AST 38 (H) 02/27/2018   ALKPHOS 88 02/27/2018   BILITOT 0.6 02/27/2018   Lab Results  Component Value Date   CHOL 210 (H) 01/07/2016   Lab Results  Component Value Date   HDL 25.40 (L) 01/07/2016   Lab Results  Component Value Date   LDLCALC 98 01/17/2015   Lab Results  Component Value Date   TRIG (H) 01/07/2016    751.0 Triglyceride is over 400; calculations on Lipids are invalid.   Lab Results  Component Value Date   CHOLHDL 8 01/07/2016   Lab Results  Component Value Date   PSA 1.89 01/07/2016    ASSESSMENT AND PLAN:   Transfer pt:  1) Elevated blood pressure, with some measurements that meet newest criteria for dx of hypertension. Discussed this in depth today.  Positive FH HTN. Declines med at this time and I am in agreement with this. He has some things to work on with his diet--DASH diet discussed and handout given. Increase in cardiovascular exercise needed---discussed goal of 30 min of vigorous CV exercise 5 of 7 days a week. No home bp monitoring at this time.  2) CML: stable, tolerating long term treatment with Gleevec, getting approp f/u with oncologist (most recent 02/22/18)-->on q56mo schedule.  Spent 25 min with pt today, with >50% of this time spent in  counseling and care coordination regarding the above problems.  An After Visit Summary was printed and given to the patient.  Return in about 6 months (around 01/05/2019) for annual CPE (fasting).  Signed:  [name], MD           07/05/2018

## 2018-08-30 ENCOUNTER — Telehealth: Payer: Self-pay | Admitting: Oncology

## 2018-08-30 ENCOUNTER — Encounter: Payer: Self-pay | Admitting: Oncology

## 2018-08-30 ENCOUNTER — Inpatient Hospital Stay: Payer: BLUE CROSS/BLUE SHIELD | Attending: Internal Medicine

## 2018-08-30 ENCOUNTER — Inpatient Hospital Stay: Payer: BLUE CROSS/BLUE SHIELD | Admitting: Oncology

## 2018-08-30 VITALS — BP 122/92 | HR 58 | Temp 98.1°F | Resp 18 | Ht 71.0 in | Wt 190.2 lb

## 2018-08-30 DIAGNOSIS — C9210 Chronic myeloid leukemia, BCR/ABL-positive, not having achieved remission: Secondary | ICD-10-CM

## 2018-08-30 LAB — CMP (CANCER CENTER ONLY)
ALT: 37 U/L (ref 0–44)
AST: 43 U/L — ABNORMAL HIGH (ref 15–41)
Albumin: 4.3 g/dL (ref 3.5–5.0)
Alkaline Phosphatase: 79 U/L (ref 38–126)
Anion gap: 9 (ref 5–15)
BUN: 15 mg/dL (ref 6–20)
CO2: 22 mmol/L (ref 22–32)
Calcium: 9.2 mg/dL (ref 8.9–10.3)
Chloride: 110 mmol/L (ref 98–111)
Creatinine: 1.2 mg/dL (ref 0.61–1.24)
GFR, Est AFR Am: >60 mL/min (ref >60)
GFR, Estimated: >60 mL/min (ref >60)
Glucose, Bld: 96 mg/dL (ref 70–99)
Potassium: 4.1 mmol/L (ref 3.5–5.1)
Sodium: 141 mmol/L (ref 135–145)
Total Bilirubin: 1 mg/dL (ref 0.3–1.2)
Total Protein: 6.9 g/dL (ref 6.5–8.1)

## 2018-08-30 LAB — CBC WITH DIFFERENTIAL (CANCER CENTER ONLY)
BASOS ABS: 0 10*3/uL (ref 0.0–0.1)
BASOS PCT: 1 %
Eosinophils Absolute: 0 10*3/uL (ref 0.0–0.5)
Eosinophils Relative: 1 %
HCT: 39.9 % (ref 38.4–49.9)
Hemoglobin: 13.5 g/dL (ref 13.0–17.1)
LYMPHS PCT: 30 %
Lymphs Abs: 0.8 10*3/uL — ABNORMAL LOW (ref 0.9–3.3)
MCH: 32.8 pg (ref 27.2–33.4)
MCHC: 33.9 g/dL (ref 32.0–36.0)
MCV: 96.5 fL (ref 79.3–98.0)
Monocytes Absolute: 0.2 10*3/uL (ref 0.1–0.9)
Monocytes Relative: 7 %
NEUTROS ABS: 1.7 10*3/uL (ref 1.5–6.5)
Neutrophils Relative %: 61 %
Platelet Count: 151 10*3/uL (ref 140–400)
RBC: 4.14 MIL/uL — AB (ref 4.20–5.82)
RDW: 13.1 % (ref 11.0–14.6)
WBC: 2.8 10*3/uL — AB (ref 4.0–10.3)

## 2018-08-30 LAB — LACTATE DEHYDROGENASE: LDH: 253 U/L — ABNORMAL HIGH (ref 98–192)

## 2018-08-30 NOTE — Assessment & Plan Note (Addendum)
This is a very pleasant 52 year old white male with chronic myeloid leukemia diagnosed in February 2009. The patient has been on treatment with Gleevec 400 mg by mouth daily. The patient is doing fine today with no concerning complaints.  He is tolerating his treatment with Gleevec fairly well. I recommended for him to continue his current treatment with Gleevec with the same dose. BCR/ABL is pending today. The patient will follow-up in 6 months for evaluation and repeat lab work.  He was encouraged to call us in a couple of weeks to get the results of his BCR/ABL test.  He was advised to call immediately if he has any concerning symptoms in the interval. The patient voices understanding of current disease status and treatment options and is in agreement with the current care plan. All questions were answered. The patient knows to call the clinic with any problems, questions or concerns. We can certainly see the patient much sooner if necessary.

## 2018-08-30 NOTE — Progress Notes (Signed)
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  PRIOR THERAPY: None  CURRENT THERAPY: Gleevec 400 mg by mouth daily started February 2009  INTERVAL HISTORY: Michael [name] 52 y.o. male returns for routine follow-up visit by himself.  The patient is feeling fine today and has no specific complaints.  He denies fevers and chills.  Denies chest pain, shortness breath, cough, hemoptysis.  Denies nausea, vomiting, constipation, diarrhea.  Denies lower extremity edema.  Denies rashes.  Denies recent weight loss or night sweats.  He is tolerating his treatment with Gleevec fairly well.  The patient is here for evaluation and repeat lab work.  MEDICAL HISTORY: Past Medical History:  Diagnosis Date  . Anal fistula   . 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 CML (chronic myelocytic leukemia) ([place])    Dx'd 2009; treated with Gleevec since that time.  . Hypertriglyceridemia   . Leukocytosis, unspecified   . Liver cyst    Left hepatic dome 2 cm cyst, CT Sep2013  . Pruritus ani, intermittent 01/17/2013  . Splenomegaly    resolved CT Sep2013    ALLERGIES:  has No Known Allergies.  MEDICATIONS:  Current Outpatient Medications  Medication Sig Dispense Refill  . imatinib (GLEEVEC) 400 MG tablet TAKE 1 TABLET BY MOUTH ONCE DAILY AT THE SAME TIME WITH FOOD AND A LARGE GLASS OF WATER. DO NOT CRUSH TABLETS. AVOID GRAPEFRUIT PRODUCTS. 90 tablet 0   No current facility-administered medications for this visit.     SURGICAL HISTORY:  Past Surgical History:  Procedure Laterality Date  . COLONOSCOPY  01/09/2018   mild sigmoid diverticulosis, otherwise normal.  Recall 10 yrs.  [name] REFRACTIVE SURGERY  2000  . TOENAIL EXCISION  01/16/2013   ingrown toenail- not removed   . TONSILECTOMY, ADENOIDECTOMY, BILATERAL MYRINGOTOMY  AND TUBES     age 52  . TREATMENT FISTULA ANAL  2015   Anal fistula surgically repaired.    REVIEW OF SYSTEMS:   Review of Systems  Constitutional: Negative for appetite change, chills, fatigue, fever and unexpected weight change.  HENT:   Negative for mouth sores, nosebleeds, sore throat and trouble swallowing.   Eyes: Negative for eye problems and icterus.  Respiratory: Negative for cough, hemoptysis, shortness of breath and wheezing.   Cardiovascular: Negative for chest pain and leg swelling.  Gastrointestinal: Negative for abdominal pain, constipation, diarrhea, nausea and vomiting.  Genitourinary: Negative for bladder incontinence, difficulty urinating, dysuria, frequency and hematuria.   Musculoskeletal: Negative for back pain, gait problem, neck pain and neck stiffness.  Skin: Negative for itching and rash.  Neurological: Negative for dizziness, extremity weakness, gait problem, headaches, light-headedness and seizures.  Hematological: Negative for adenopathy. Does not bruise/bleed easily.  Psychiatric/Behavioral: Negative for confusion, depression and sleep disturbance. The patient is not nervous/anxious.     PHYSICAL EXAMINATION:  Blood pressure (!) 122/92, pulse (!) 58, temperature 98.1 F (36.7 C), temperature source Oral, resp. rate 18, height 5\' 11"  (1.803 m), weight 190 lb 3.2 oz (86.3 kg), SpO2 100 %.  ECOG PERFORMANCE STATUS: 0 - Asymptomatic  Physical Exam  Constitutional: Oriented to person, place, and time and well-developed, well-nourished, and in no distress. No distress.  HENT:  Head: Normocephalic and atraumatic.  Mouth/Throat: Oropharynx is clear and moist. No oropharyngeal exudate.  Eyes: Conjunctivae are normal. Right eye exhibits no discharge. Left eye  exhibits no discharge. No scleral icterus.  Neck: Normal range of motion. Neck supple.  Cardiovascular: Normal rate, regular rhythm, normal heart sounds and intact distal pulses.   Pulmonary/Chest: Effort  normal and breath sounds normal. No respiratory distress. No wheezes. No rales.  Abdominal: Soft. Bowel sounds are normal. Exhibits no distension and no mass. There is no tenderness.  Musculoskeletal: Normal range of motion. Exhibits no edema.  Lymphadenopathy:    No cervical adenopathy.  Neurological: Alert and oriented to person, place, and time. Exhibits normal muscle tone. Gait normal. Coordination normal.  Skin: Skin is warm and dry. No rash noted. Not diaphoretic. No erythema. No pallor.  Psychiatric: Mood, memory and judgment normal.  Vitals reviewed.  LABORATORY DATA: Lab Results  Component Value Date   WBC 2.8 (L) 08/30/2018   HGB 13.5 08/30/2018   HCT 39.9 08/30/2018   MCV 96.5 08/30/2018   PLT 151 08/30/2018      Chemistry      Component Value Date/Time   NA 140 02/27/2018 1511   NA 141 08/23/2017 1545   K 4.2 02/27/2018 1511   K 3.8 08/23/2017 1545   CL 107 02/27/2018 1511   CL 108 (H) 01/19/2013 1206   CO2 26 02/27/2018 1511   CO2 26 08/23/2017 1545   BUN 16 02/27/2018 1511   BUN 20.2 08/23/2017 1545   CREATININE 1.21 02/27/2018 1511   CREATININE 1.3 08/23/2017 1545   GLU 97 01/17/2015      Component Value Date/Time   CALCIUM 9.6 02/27/2018 1511   CALCIUM 9.3 08/23/2017 1545   ALKPHOS 88 02/27/2018 1511   ALKPHOS 100 08/23/2017 1545   AST 38 (H) 02/27/2018 1511   AST 33 08/23/2017 1545   ALT 37 02/27/2018 1511   ALT 32 08/23/2017 1545   BILITOT 0.6 02/27/2018 1511   BILITOT 0.54 08/23/2017 1545       RADIOGRAPHIC STUDIES:  No results found.   ASSESSMENT/PLAN:  Chronic myeloid leukemia This is a very pleasant 52 year old white male with chronic myeloid leukemia diagnosed in February 2009. The patient has been on treatment with Gleevec 400 mg by mouth daily. The patient is doing fine today with no concerning complaints.  He is tolerating his treatment with Gleevec fairly well. I recommended for him to continue his current treatment with Gleevec  with the same dose. BCR/ABL is pending today. The patient will follow-up in 6 months for evaluation and repeat lab work.  He was encouraged to call us in a couple of weeks to get the results of his BCR/ABL test.  He was advised to call immediately if he has any concerning symptoms in the interval. The patient voices understanding of current disease status and treatment options and is in agreement with the current care plan. All questions were answered. The patient knows to call the clinic with any problems, questions or concerns. We can certainly see the patient much sooner if necessary.   Orders Placed This Encounter  Procedures  . CBC with Differential (Cancer Center Only)    Standing Status:   Future    Standing Expiration Date:   08/31/2019  . CMP ([facility] only)    Standing Status:   Future    Standing Expiration Date:   08/31/2019  . Lactate dehydrogenase    Standing Status:   Future    Standing Expiration Date:   08/31/2019     Mikey Bussing, DNP, AGPCNP-BC, AOCNP 08/30/18

## 2018-08-30 NOTE — Telephone Encounter (Signed)
Gave pt avs and calendar  °

## 2018-09-11 ENCOUNTER — Other Ambulatory Visit: Payer: Self-pay | Admitting: Internal Medicine

## 2018-09-11 DIAGNOSIS — C9210 Chronic myeloid leukemia, BCR/ABL-positive, not having achieved remission: Secondary | ICD-10-CM

## 2018-09-18 LAB — BCR/ABL

## 2018-11-02 ENCOUNTER — Telehealth: Payer: Self-pay | Admitting: Medical Oncology

## 2018-11-02 NOTE — Telephone Encounter (Signed)
No issue with gleevec rx.

## 2018-12-29 ENCOUNTER — Telehealth: Payer: Self-pay | Admitting: *Deleted

## 2018-12-29 NOTE — Telephone Encounter (Signed)
Received TC from patient regarding refill authorization for his Gleevec. He states that [facility] told him that they fax'd PA request to  Dr. Julien Nordmann desk. Pt is asking if it was received and being processed. Informed Bjorn that we did receive that fax late yesterday afternoon and it has been given to oral chemo pharmacist for PA.

## 2019-01-02 ENCOUNTER — Telehealth: Payer: Self-pay | Admitting: *Deleted

## 2019-01-02 NOTE — Telephone Encounter (Signed)
T  From patient this am stating that [facility] did not receive fax'd order from yesterday.  Checked faxes that were sent yesterday. It did not go through as it was not the correct fax#. Refax'd to correct #  Informed patient that today's fax did go through. He will check with [facility] in a few hours. Pt appreciative of today's assistance.

## 2019-01-03 ENCOUNTER — Telehealth: Payer: Self-pay | Admitting: *Deleted

## 2019-01-03 NOTE — Telephone Encounter (Signed)
Pt called states the pharmacy still wants information regarding the refill of Gleevac. Pt requesting to conference call to discuss this issue  Call placed pharmacy advised they received the PA# but denied due to insufficient  Information.  22 minute call with [facility] answering questions ( as previously provided on faxed questionnaire). Pts medication will be expedited and has been approved.

## 2019-01-05 ENCOUNTER — Encounter: Payer: Self-pay | Admitting: Family Medicine

## 2019-01-05 ENCOUNTER — Ambulatory Visit (INDEPENDENT_AMBULATORY_CARE_PROVIDER_SITE_OTHER): Payer: BLUE CROSS/BLUE SHIELD | Admitting: Family Medicine

## 2019-01-05 VITALS — BP 160/92 | HR 62 | Temp 97.6°F | Resp 16 | Ht 70.5 in | Wt 196.0 lb

## 2019-01-05 DIAGNOSIS — Z0000 Encounter for general adult medical examination without abnormal findings: Secondary | ICD-10-CM

## 2019-01-05 DIAGNOSIS — R030 Elevated blood-pressure reading, without diagnosis of hypertension: Secondary | ICD-10-CM

## 2019-01-05 DIAGNOSIS — Z23 Encounter for immunization: Secondary | ICD-10-CM | POA: Diagnosis not present

## 2019-01-05 DIAGNOSIS — C9211 Chronic myeloid leukemia, BCR/ABL-positive, in remission: Secondary | ICD-10-CM

## 2019-01-05 DIAGNOSIS — Z125 Encounter for screening for malignant neoplasm of prostate: Secondary | ICD-10-CM

## 2019-01-05 DIAGNOSIS — Z0001 Encounter for general adult medical examination with abnormal findings: Secondary | ICD-10-CM

## 2019-01-05 DIAGNOSIS — E781 Pure hyperglyceridemia: Secondary | ICD-10-CM

## 2019-01-05 NOTE — Patient Instructions (Signed)

## 2019-01-05 NOTE — Progress Notes (Signed)
Office Note 01/05/2019  CC:  Chief Complaint  Patient presents with  . Annual Exam    Pt is not fasting.    HPI:  Michael [name] is a 53 y.o. White male who is here for annual health maintenance exam.  He has not really consistently done CV exercise like he had planned. Also hasn't made good dietary changes yet.   Past Medical History:  Diagnosis Date  . Anal fistula   . CML in remission ([place])    Dx'd 2009; treated with [place] since that time.  [name] History of pneumonia approx 2014  . Hypertriglyceridemia   . Liver cyst    Left hepatic dome 2 cm cyst, CT Sep2013  . Pruritus ani, intermittent 01/17/2013  . Splenomegaly    resolved CT Sep2013-->this is how his CML was detected/diagnosed    Past Surgical History:  Procedure Laterality Date  . COLONOSCOPY  01/09/2018   mild sigmoid diverticulosis, otherwise normal.  Recall 10 yrs.  [name] REFRACTIVE SURGERY  2000  . TOENAIL EXCISION  01/16/2013   ingrown toenail- not removed   . TONSILECTOMY, ADENOIDECTOMY, BILATERAL MYRINGOTOMY AND TUBES     age 38  . TREATMENT FISTULA ANAL  2015   Anal fistula surgically repaired.    Family History  Problem Relation Age of Onset  . Heart disease Mother        died from a  reaction to heparin   . Cancer Father 30       prostate  . Prostate cancer Father   . Heart disease Father   . Pulmonary fibrosis Father   . Colon cancer Neg Hx   . Colon polyps Neg Hx     Social History   Socioeconomic History  . Marital status: Married    Spouse name: Not on file  . Number of children: Not on file  . Years of education: Not on file  . Highest education level: Not on file  Occupational History  . Not on file  Social Needs  . Financial resource strain: Not on file  . Food insecurity:    Worry: Not on file    Inability: Not on file  . Transportation needs:    Medical: Not on file    Non-medical: Not on file  Tobacco Use  . Smoking status: Never Smoker  . Smokeless tobacco:  Never Used  Substance and Sexual Activity  . Alcohol use: Yes    Comment: occasional   . Drug use: No  . Sexual activity: Not on file  Lifestyle  . Physical activity:    Days per week: Not on file    Minutes per session: Not on file  . Stress: Not on file  Relationships  . Social connections:    Talks on phone: Not on file    Gets together: Not on file    Attends religious service: Not on file    Active member of club or organization: Not on file    Attends meetings of clubs or organizations: Not on file    Relationship status: Not on file  . Intimate partner violence:    Fear of current or ex partner: Not on file    Emotionally abused: Not on file    Physically abused: Not on file    Forced sexual activity: Not on file  Other Topics Concern  . Not on file  Social History Narrative   Married, 1 son and 1 daughter.   Orig from [place].   Grad from  [employer].   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   No T/A/Ds.    Outpatient Medications Prior to Visit  Medication Sig Dispense Refill  . imatinib (GLEEVEC) 400 MG tablet TAKE 1 TABLET BY MOUTH ONCE DAILY AT THE SAME TIME WITH FOOD AND A LARGE GLASS OF WATER. DO NOT CRUSH TABLETS. AVOID GRAPEFRUIT PRODUCTS. 90 tablet 0   No facility-administered medications prior to visit.     No Known Allergies  ROS Review of Systems  Constitutional: Negative for appetite change, chills, fatigue and fever.  HENT: Negative for congestion, dental problem, ear pain and sore throat.   Eyes: Negative for discharge, redness and visual disturbance.  Respiratory: Negative for cough, chest tightness, shortness of breath and wheezing.   Cardiovascular: Negative for chest pain, palpitations and leg swelling.  Gastrointestinal: Negative for abdominal pain, blood in stool, diarrhea, nausea and vomiting.  Genitourinary: Negative for difficulty urinating, dysuria, flank pain, frequency, hematuria and urgency.   Musculoskeletal: Negative for arthralgias, back pain, joint swelling, myalgias and neck stiffness.       Left heel pain x months  Skin: Negative for pallor and rash.  Neurological: Negative for dizziness, speech difficulty, weakness and headaches.  Hematological: Negative for adenopathy. Does not bruise/bleed easily.  Psychiatric/Behavioral: Negative for confusion and sleep disturbance. The patient is not nervous/anxious.     PE; Blood pressure (!) 160/92, pulse 62, temperature 97.6 F (36.4 C), temperature source Oral, resp. rate 16, height 5' 10.5" (1.791 m), weight 196 lb (88.9 kg), SpO2 99 %. Body mass index is 27.73 kg/m.  Gen: Alert, well appearing.  Patient is oriented to person, place, time, and situation. AFFECT: pleasant, lucid thought and speech. ENT: Ears: EACs clear, normal epithelium.  TMs with good light reflex and landmarks bilaterally.  Eyes: no injection, icteris, swelling, or exudate.  EOMI, PERRLA. Nose: no drainage or turbinate edema/swelling.  No injection or focal lesion.  Mouth: lips without lesion/swelling.  Oral mucosa pink and moist.  Dentition intact and without obvious caries or gingival swelling.  Oropharynx without erythema, exudate, or swelling.  Neck: supple/nontender.  No LAD, mass, or TM.  Carotid pulses 2+ bilaterally, without bruits. CV: RRR, no m/r/g.   LUNGS: CTA bilat, nonlabored resps, good aeration in all lung fields. ABD: soft, NT, ND, BS normal.  No hepatospenomegaly or mass.  No bruits. EXT: no clubbing, cyanosis, or edema.  Musculoskeletal: no joint swelling, erythema, warmth, or tenderness.  ROM of all joints intact. Skin - no sores or suspicious lesions or rashes or color changes Rectal exam: negative without mass, lesions or tenderness, PROSTATE EXAM: smooth and symmetric without nodules or tenderness.   Pertinent labs:  Lab Results  Component Value Date   TSH 1.39 01/07/2016   Lab Results  Component Value Date   WBC 2.8 (L)  08/30/2018   HGB 13.5 08/30/2018   HCT 39.9 08/30/2018   MCV 96.5 08/30/2018   PLT 151 08/30/2018   Lab Results  Component Value Date   CREATININE 1.20 08/30/2018   BUN 15 08/30/2018   NA 141 08/30/2018   K 4.1 08/30/2018   CL 110 08/30/2018   CO2 22 08/30/2018   Lab Results  Component Value Date   ALT 37 08/30/2018   AST 43 (H) 08/30/2018   ALKPHOS 79 08/30/2018   BILITOT 1.0 08/30/2018   Lab Results  Component Value Date   CHOL 210 (H) 01/07/2016   Lab Results  Component Value Date   HDL 25.40 (  L) 01/07/2016   Lab Results  Component Value Date   LDLCALC 98 01/17/2015   Lab Results  Component Value Date   TRIG (H) 01/07/2016    751.0 Triglyceride is over 400; calculations on Lipids are invalid.   Lab Results  Component Value Date   CHOLHDL 8 01/07/2016   Lab Results  Component Value Date   PSA 1.89 01/07/2016    ASSESSMENT AND PLAN:   1) Elevated blood pressure w/out dx of HTN: he did not monitor bp's outside of office like we had wanted after his visit 06/2018, so he'll buy home bp cuff and start doing this. Goal 130/80 avg discussed today.  He knows about DASH diet and CV exercise recommended. F/u to review bp's in 3 months.  2) Health maintenance exam: Reviewed age and gender appropriate health maintenance issues (prudent diet, regular exercise, health risks of tobacco and excessive alcohol, use of seatbelts, fire alarms in home, use of sunscreen).  Also reviewed age and gender appropriate health screening as well as vaccine recommendations. Vaccines: flu vaccine-->UTD.   Shingrix-->not a candidate due to immunocompromised state.  Pneumovax 23 (pt has CML and is on a tyrosine kinase inhibitor= immunocompromised state). Labs: HP labs + PSA-->future when fasting. Prostate ca screening: DRE normal today  PSA with future labs. Colon ca screening: next colonoscopy 2029.  An After Visit Summary was printed and given to the patient.  FOLLOW UP:  Return in  about 3 months (around 04/06/2019) for f/u elevated blood pressures; also needs fasting lab appt at his earliest convenience.  Signed:  [name], MD           01/05/2019

## 2019-01-12 ENCOUNTER — Other Ambulatory Visit (INDEPENDENT_AMBULATORY_CARE_PROVIDER_SITE_OTHER): Payer: BLUE CROSS/BLUE SHIELD

## 2019-01-12 ENCOUNTER — Other Ambulatory Visit: Payer: Self-pay | Admitting: Internal Medicine

## 2019-01-12 DIAGNOSIS — R030 Elevated blood-pressure reading, without diagnosis of hypertension: Secondary | ICD-10-CM

## 2019-01-12 DIAGNOSIS — C9211 Chronic myeloid leukemia, BCR/ABL-positive, in remission: Secondary | ICD-10-CM

## 2019-01-12 DIAGNOSIS — Z125 Encounter for screening for malignant neoplasm of prostate: Secondary | ICD-10-CM

## 2019-01-12 DIAGNOSIS — C9210 Chronic myeloid leukemia, BCR/ABL-positive, not having achieved remission: Secondary | ICD-10-CM

## 2019-01-12 DIAGNOSIS — E781 Pure hyperglyceridemia: Secondary | ICD-10-CM | POA: Diagnosis not present

## 2019-01-13 LAB — COMPREHENSIVE METABOLIC PANEL
AG Ratio: 2 (calc) (ref 1.0–2.5)
ALT: 31 U/L (ref 9–46)
AST: 29 U/L (ref 10–35)
Albumin: 4.6 g/dL (ref 3.6–5.1)
Alkaline phosphatase (APISO): 62 U/L (ref 40–115)
BUN / CREAT RATIO: 12 (calc) (ref 6–22)
BUN: 17 mg/dL (ref 7–25)
CO2: 23 mmol/L (ref 20–32)
Calcium: 9.6 mg/dL (ref 8.6–10.3)
Chloride: 106 mmol/L (ref 98–110)
Creat: 1.41 mg/dL — ABNORMAL HIGH (ref 0.70–1.33)
GLUCOSE: 100 mg/dL — AB (ref 65–99)
Globulin: 2.3 g/dL (calc) (ref 1.9–3.7)
Potassium: 4.4 mmol/L (ref 3.5–5.3)
SODIUM: 140 mmol/L (ref 135–146)
TOTAL PROTEIN: 6.9 g/dL (ref 6.1–8.1)
Total Bilirubin: 0.8 mg/dL (ref 0.2–1.2)

## 2019-01-13 LAB — CBC WITH DIFFERENTIAL/PLATELET
ABSOLUTE MONOCYTES: 282 {cells}/uL (ref 200–950)
Basophils Absolute: 42 cells/uL (ref 0–200)
Basophils Relative: 1.3 %
Eosinophils Absolute: 29 cells/uL (ref 15–500)
Eosinophils Relative: 0.9 %
HCT: 40.2 % (ref 38.5–50.0)
HEMOGLOBIN: 13.8 g/dL (ref 13.2–17.1)
Lymphs Abs: 1104 cells/uL (ref 850–3900)
MCH: 32.9 pg (ref 27.0–33.0)
MCHC: 34.3 g/dL (ref 32.0–36.0)
MCV: 95.7 fL (ref 80.0–100.0)
MPV: 10.8 fL (ref 7.5–12.5)
Monocytes Relative: 8.8 %
Neutro Abs: 1744 cells/uL (ref 1500–7800)
Neutrophils Relative %: 54.5 %
Platelets: 189 10*3/uL (ref 140–400)
RBC: 4.2 10*6/uL (ref 4.20–5.80)
RDW: 13.1 % (ref 11.0–15.0)
Total Lymphocyte: 34.5 %
WBC: 3.2 10*3/uL — ABNORMAL LOW (ref 3.8–10.8)

## 2019-01-13 LAB — LIPID PANEL
Cholesterol: 173 mg/dL (ref <200)
HDL: 34 mg/dL — ABNORMAL LOW (ref >40)
LDL Cholesterol (Calc): 107 mg/dL (calc) — ABNORMAL HIGH
Non-HDL Cholesterol (Calc): 139 mg/dL (calc) — ABNORMAL HIGH (ref <130)
TRIGLYCERIDES: 201 mg/dL — AB (ref <150)
Total CHOL/HDL Ratio: 5.1 (calc) — ABNORMAL HIGH (ref <5.0)

## 2019-01-13 LAB — TSH: TSH: 1.91 mIU/L (ref 0.40–4.50)

## 2019-01-13 LAB — PSA: PSA: 2.4 ng/mL (ref <=4.0)

## 2019-01-14 ENCOUNTER — Encounter: Payer: Self-pay | Admitting: Family Medicine

## 2019-01-15 ENCOUNTER — Encounter: Payer: Self-pay | Admitting: *Deleted

## 2019-03-01 ENCOUNTER — Inpatient Hospital Stay: Payer: BLUE CROSS/BLUE SHIELD | Attending: Internal Medicine

## 2019-03-01 ENCOUNTER — Inpatient Hospital Stay: Payer: BLUE CROSS/BLUE SHIELD | Admitting: Internal Medicine

## 2019-07-20 ENCOUNTER — Other Ambulatory Visit: Payer: Self-pay | Admitting: Internal Medicine

## 2019-07-20 DIAGNOSIS — C9210 Chronic myeloid leukemia, BCR/ABL-positive, not having achieved remission: Secondary | ICD-10-CM

## 2019-09-26 ENCOUNTER — Other Ambulatory Visit: Payer: Self-pay | Admitting: Internal Medicine

## 2019-09-26 DIAGNOSIS — C9210 Chronic myeloid leukemia, BCR/ABL-positive, not having achieved remission: Secondary | ICD-10-CM

## 2019-11-30 ENCOUNTER — Other Ambulatory Visit: Payer: Self-pay | Admitting: Internal Medicine

## 2019-11-30 DIAGNOSIS — C9210 Chronic myeloid leukemia, BCR/ABL-positive, not having achieved remission: Secondary | ICD-10-CM

## 2019-12-05 ENCOUNTER — Telehealth: Payer: Self-pay | Admitting: Medical Oncology

## 2019-12-05 NOTE — Telephone Encounter (Signed)
A form received for Gleevec vs generic.Sent to [facility] for signature.

## 2020-01-21 ENCOUNTER — Telehealth: Payer: Self-pay | Admitting: Medical Oncology

## 2020-01-21 ENCOUNTER — Other Ambulatory Visit: Payer: Self-pay | Admitting: Medical Oncology

## 2020-01-21 DIAGNOSIS — Z0000 Encounter for general adult medical examination without abnormal findings: Secondary | ICD-10-CM

## 2020-01-21 DIAGNOSIS — C9210 Chronic myeloid leukemia, BCR/ABL-positive, not having achieved remission: Secondary | ICD-10-CM

## 2020-01-21 DIAGNOSIS — Z125 Encounter for screening for malignant neoplasm of prostate: Secondary | ICD-10-CM

## 2020-01-21 DIAGNOSIS — E781 Pure hyperglyceridemia: Secondary | ICD-10-CM

## 2020-01-21 DIAGNOSIS — N183 Chronic kidney disease, stage 3 unspecified: Secondary | ICD-10-CM

## 2020-01-21 NOTE — Telephone Encounter (Signed)
Yes, ok. Labs ordered.

## 2020-01-21 NOTE — Addendum Note (Signed)
Addended by: Tammi Sou on: 01/21/2020 04:46 PM   Modules accepted: Orders

## 2020-01-21 NOTE — Telephone Encounter (Addendum)
He will call us for [place] refill next week. He requests  to get #90 with 1 refill because his insurance may be changing in may or June.

## 2020-01-21 NOTE — Telephone Encounter (Addendum)
He has PCP appt Friday with Dr Shawnie Dapper and he can get labs done for Dr. Julien Nordmann. He is leary about coming to cancer center due to crowded lobby.

## 2020-01-21 NOTE — Telephone Encounter (Signed)
PCP appt -Pt scheduled to see his PCP  this Friday for annual visit. He requested that [facility] order labs at Dr Anitra Lauth 's office . Pt leary of coming to cancer center to get labs due to crowded lobby.    Per Dr Julien Nordmann it is okay to get labs ordered and drawn from PCP. Dr Julien Nordmann will f/u with a virtual visit to discuss results and manage refills.

## 2020-01-22 NOTE — Telephone Encounter (Signed)
Patient advised lab orders placed and can be drawn on Friday.

## 2020-01-25 ENCOUNTER — Other Ambulatory Visit: Payer: Self-pay

## 2020-01-25 ENCOUNTER — Encounter: Payer: Self-pay | Admitting: Family Medicine

## 2020-01-25 ENCOUNTER — Ambulatory Visit (INDEPENDENT_AMBULATORY_CARE_PROVIDER_SITE_OTHER): Payer: 59 | Admitting: Family Medicine

## 2020-01-25 VITALS — BP 164/114 | HR 72 | Temp 98.2°F | Resp 16 | Ht 70.0 in | Wt 200.0 lb

## 2020-01-25 DIAGNOSIS — N183 Chronic kidney disease, stage 3 unspecified: Secondary | ICD-10-CM | POA: Diagnosis not present

## 2020-01-25 DIAGNOSIS — Z0000 Encounter for general adult medical examination without abnormal findings: Secondary | ICD-10-CM

## 2020-01-25 DIAGNOSIS — C9210 Chronic myeloid leukemia, BCR/ABL-positive, not having achieved remission: Secondary | ICD-10-CM

## 2020-01-25 DIAGNOSIS — Z125 Encounter for screening for malignant neoplasm of prostate: Secondary | ICD-10-CM

## 2020-01-25 DIAGNOSIS — I10 Essential (primary) hypertension: Secondary | ICD-10-CM | POA: Diagnosis not present

## 2020-01-25 DIAGNOSIS — E781 Pure hyperglyceridemia: Secondary | ICD-10-CM

## 2020-01-25 MED ORDER — AMLODIPINE BESY-BENAZEPRIL HCL 5-20 MG PO CAPS: 1.0000 | ORAL_CAPSULE | Freq: Every day | ORAL | 0 refills | Status: DC

## 2020-01-25 NOTE — Patient Instructions (Signed)
We are committed to keeping you informed about the COVID-19 vaccine.  As the vaccine continues to become available for each phase, we will ensure that patients who meet the criteria receive the information they need to access vaccination opportunities. Continue to check your MyChart account and [email or web address] for updates. Please review the Phase 1b information below.  [place] On Tuesday, Jan. 19, the [facility]) and [place] began large-scale COVID-19 vaccinations at the [place]. The vaccinations are appointment only and for those 37 and older.  Walk-ins will not be accepted.  All appointments are currently filled. Please join our waiting list for the next available appointments. We will contact you when appointments become available. Please do not sign up more than once.  Join Our Waiting List  Our daily vaccination capacity will continue to increase, including expansion of our [employer] site, increasing mobile clinics across our service region and plans to open COVID-19 vaccination clinics in [place] and [place] counties in February.  On Friday, Jan. 22, we announced the need to reschedule 10,400 vaccination appointments because of the state's decision not to allocate [facility] with COVID-19 vaccines for the week of Jan. 25. Our announcement of this news is available here.  Going forward, we will schedule vaccination appointments weekly based on vaccine allocations provided by the state.  On Friday, Jan. 29, the [place] announced that [employer] and [place] will again be receiving vaccines. Our announcement of this news is available here.  We will first schedule vaccination appointments for those whose appointments were cancelled last week, then schedule those on the  [place] waiting list in the order in which they registered. Those who had their appointment cancelled should expect notification by email (or by phone for those without email) at least 48 hours prior to their rescheduled appointment.  Other Vaccination Opportunities in [place] We are also working in partnership with county health agencies in our service counties to ensure continuing vaccination availability in the weeks and months ahead. Learn more about each county's vaccination efforts in the website links below:   [place]'s phase 1b vaccination guidelines, prioritizing those 65 and over as the next eligible group to receive the COVID-19 vaccine, are detailed at [email or web address].[email or web address].   Vaccine Safety and Effectiveness Clinical trials for the Pfizer COVID-19 vaccine involved 42,000 people and showed that the vaccine is more than 95% effective in preventing COVID-19 with no serious safety concerns. Similar results have been reported for the Moderna COVID-19 vaccine. Side effects reported in the [place] clinical trials include a sore arm at the injection site, fatigue, headache, chills and fever. While side effects from the [place] COVID-19 vaccine are higher than for a typical flu vaccine, they are lower in many ways than side effects from the leading vaccine to prevent shingles. Side effects are signs that a vaccine is working and are related to your immune system being stimulated to produce antibodies against infection. Side effects from vaccination are far less significant than health impacts from COVID-19.  Staying Informed Pharmacists, infectious disease doctors, critical care nurses and other experts at [facility] continue to speak publicly through media interviews and direct communication with our patients and communities about the safety, effectiveness and importance of vaccines to eliminate COVID-19. In addition,  reliable information  on vaccine safety, effectiveness, side effects and more is available on the following websites:  [place] Department of Health and Human Services COVID-19 Vaccine Information Website.  U.S. Centers for Disease Control and Prevention XX123456 Human resources officer.  Staying Safe We agree with the CDC on what we can do to help our communities get back to normal: Getting "back to normal" is going to take all of our tools. If we use all the tools we have, we stand the best chance of getting our families, communities, schools and workplaces "back to normal" sooner:  Get vaccinated as soon as vaccines become available within the phase of the state's vaccination rollout plan for which you meet the eligibility criteria.  Wear a mask.  Stay 6 feet from others and avoid crowds.  Wash hands often.  For our most current information, please visit [email or web address].

## 2020-01-25 NOTE — Progress Notes (Signed)
Office Note 01/25/2020  CC:  Chief Complaint  Patient presents with  . Annual Exam    HPI:  Michael [name] is a 54 y.o. male who is here for annual health maintenance exam. Doing fine.  No physical complaints. Working on swing speed all year (golf)--seeing great results.  Home bp monitoring: 145/90 typically.  He is ready to start meds. He's been running. Avg resting HR 56.  Past Medical History:  Diagnosis Date  . Anal fistula   . Chronic renal insufficiency, stage 3 (moderate)    GFR 50s  . 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  . Elevated blood-pressure reading without diagnosis of hypertension   . History of pneumonia approx 2014  . Hypertriglyceridemia   . Liver cyst    Left hepatic dome 2 cm cyst, CT Sep2013  . Pruritus ani, intermittent 01/17/2013  . Splenomegaly    resolved CT Sep2013-->this is how his CML was detected/diagnosed    Past Surgical History:  Procedure Laterality Date  . COLONOSCOPY  01/09/2018   mild sigmoid diverticulosis, otherwise normal.  Recall 10 yrs.  [name] REFRACTIVE SURGERY  2000  . TOENAIL EXCISION  01/16/2013   ingrown toenail- not removed   . TONSILECTOMY, ADENOIDECTOMY, BILATERAL MYRINGOTOMY AND TUBES     age 58  . TREATMENT FISTULA ANAL  2015   Anal fistula surgically repaired.    Family History  Problem Relation Age of Onset  . Heart disease Mother        died from a  reaction to heparin   . Cancer Father 39       prostate  . Prostate cancer Father   . Heart disease Father   . Pulmonary fibrosis Father   . Colon cancer Neg Hx   . Colon polyps Neg Hx     Social History   Socioeconomic History  . Marital status: Married    Spouse name: Not on file  . Number of children: Not on file  . Years of education: Not on file  . Highest education level: Not on file  Occupational History  . Not on file  Tobacco Use  . Smoking status: Never Smoker  . Smokeless tobacco: Never Used   Substance and Sexual Activity  . Alcohol use: Yes    Comment: occasional   . Drug use: No  . Sexual activity: Not on file  Other Topics Concern  . Not on file  Social History Narrative   Married, 1 son and 1 daughter.   Orig from [place].   Grad from [employer].   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   No T/A/Ds.   Social Determinants of Health   Financial Resource Strain:   . Difficulty of Paying Living Expenses: Not on file  Food Insecurity:   . Worried About Charity fundraiser in the Last Year: Not on file  . Ran Out of Food in the Last Year: Not on file  Transportation Needs:   . Lack of Transportation (Medical): Not on file  . Lack of Transportation (Non-Medical): Not on file  Physical Activity:   . Days of Exercise per Week: Not on file  . Minutes of Exercise per Session: Not on file  Stress:   . Feeling of Stress : Not on file  Social Connections:   . Frequency of Communication with Friends and Family: Not on file  . Frequency of Social Gatherings with Friends and  Family: Not on file  . Attends Religious Services: Not on file  . Active Member of Clubs or Organizations: Not on file  . Attends Archivist Meetings: Not on file  . Marital Status: Not on file  Intimate Partner Violence:   . Fear of Current or Ex-Partner: Not on file  . Emotionally Abused: Not on file  . Physically Abused: Not on file  . Sexually Abused: Not on file    Outpatient Medications Prior to Visit  Medication Sig Dispense Refill  . imatinib (GLEEVEC) 400 MG tablet TAKE 1 TABLET BY MOUTH ONCE DAILY AT THE SAME TIME WITH FOOD AND A LARGE GLASS OF WATER. DO NOT CRUSH TABLETS. AVOID GRAPEFRUIT PRODUCTS. 30 tablet 0   No facility-administered medications prior to visit.    No Known Allergies  ROS Review of Systems  Constitutional: Negative for appetite change, chills, fatigue and fever.  HENT: Negative for congestion, dental problem, ear pain  and sore throat.   Eyes: Negative for discharge, redness and visual disturbance.  Respiratory: Negative for cough, chest tightness, shortness of breath and wheezing.   Cardiovascular: Negative for chest pain, palpitations and leg swelling.  Gastrointestinal: Negative for abdominal pain, blood in stool, diarrhea, nausea and vomiting.  Genitourinary: Negative for difficulty urinating, dysuria, flank pain, frequency, hematuria and urgency.  Musculoskeletal: Negative for arthralgias, back pain, joint swelling, myalgias and neck stiffness.  Skin: Negative for pallor and rash.  Neurological: Negative for dizziness, speech difficulty, weakness and headaches.  Hematological: Negative for adenopathy. Does not bruise/bleed easily.  Psychiatric/Behavioral: Negative for confusion and sleep disturbance. The patient is not nervous/anxious.     PE; Blood pressure (!) 164/114, pulse 72, temperature 98.2 F (36.8 C), temperature source Temporal, resp. rate 16, height 5\' 10"  (1.778 m), weight 200 lb (90.7 kg), SpO2 100 %. Body mass index is 28.7 kg/m.  Gen: Alert, well appearing.  Patient is oriented to person, place, time, and situation. AFFECT: pleasant, lucid thought and speech. ENT: Ears: EACs clear, normal epithelium.  TMs with good light reflex and landmarks bilaterally.  Eyes: no injection, icteris, swelling, or exudate.  EOMI, PERRLA. Nose: no drainage or turbinate edema/swelling.  No injection or focal lesion.  Mouth: lips without lesion/swelling.  Oral mucosa pink and moist.  Dentition intact and without obvious caries or gingival swelling.  Oropharynx without erythema, exudate, or swelling.  Neck: supple/nontender.  No LAD, mass, or TM.  Carotid pulses 2+ bilaterally, without bruits. CV: RRR, no m/r/g.   LUNGS: CTA bilat, nonlabored resps, good aeration in all lung fields. ABD: soft, NT, ND, BS normal.  No hepatospenomegaly or mass.  No bruits. EXT: no clubbing, cyanosis, or edema.   Musculoskeletal: no joint swelling, erythema, warmth, or tenderness.  ROM of all joints intact. Skin - no sores or suspicious lesions or rashes or color changes Rectal exam: negative without mass, lesions or tenderness, PROSTATE EXAM: smooth and symmetric without nodules or tenderness.  Pertinent labs:  Lab Results  Component Value Date   TSH 1.91 01/12/2019   Lab Results  Component Value Date   WBC 3.2 (L) 01/12/2019   HGB 13.8 01/12/2019   HCT 40.2 01/12/2019   MCV 95.7 01/12/2019   PLT 189 01/12/2019   Lab Results  Component Value Date   CREATININE 1.41 (H) 01/12/2019   BUN 17 01/12/2019   NA 140 01/12/2019   K 4.4 01/12/2019   CL 106 01/12/2019   CO2 23 01/12/2019   Lab Results  Component Value Date  ALT 31 01/12/2019   AST 29 01/12/2019   ALKPHOS 79 08/30/2018   BILITOT 0.8 01/12/2019   Lab Results  Component Value Date   CHOL 173 01/12/2019   Lab Results  Component Value Date   HDL 34 (L) 01/12/2019   Lab Results  Component Value Date   LDLCALC 107 (H) 01/12/2019   Lab Results  Component Value Date   TRIG 201 (H) 01/12/2019   Lab Results  Component Value Date   CHOLHDL 5.1 (H) 01/12/2019   Lab Results  Component Value Date   PSA 2.4 01/12/2019   PSA 1.89 01/07/2016    ASSESSMENT AND PLAN:   1) HTN, new dx.  Start amlod/benaz 5-20 qd. Continue home bp and HR monitoring.  2) Health maintenance exam: Reviewed age and gender appropriate health maintenance issues (prudent diet, regular exercise, health risks of tobacco and excessive alcohol, use of seatbelts, fire alarms in home, use of sunscreen).  Also reviewed age and gender appropriate health screening as well as vaccine recommendations. Vaccines: Tdap, pneumovax 23, and flu UTD. Labs: fasting HP ordered (he ate cereal and a banana and peanut butter about 4 hrs ago). Prostate ca screening: DRE normal , PSA. Colon ca screening: next colonoscopy due 2029.  CML-->we'll forward lab results  from today to Dr. Earlie Server, his oncologist.  An After Visit Summary was printed and given to the patient.  FOLLOW UP:  Return in about 2 weeks (around 02/08/2020) for telemed or in person (patient's choice) f/u HTN.  Signed:  [name], MD           01/25/2020

## 2020-01-26 LAB — CBC WITH DIFFERENTIAL/PLATELET
Absolute Monocytes: 432 cells/uL (ref 200–950)
Basophils Absolute: 62 cells/uL (ref 0–200)
Basophils Relative: 1.2 %
Eosinophils Absolute: 21 cells/uL (ref 15–500)
Eosinophils Relative: 0.4 %
HCT: 41.5 % (ref 38.5–50.0)
Hemoglobin: 14.8 g/dL (ref 13.2–17.1)
Lymphs Abs: 1383 cells/uL (ref 850–3900)
MCH: 33.7 pg — ABNORMAL HIGH (ref 27.0–33.0)
MCHC: 35.7 g/dL (ref 32.0–36.0)
MCV: 94.5 fL (ref 80.0–100.0)
MPV: 10.9 fL (ref 7.5–12.5)
Monocytes Relative: 8.3 %
Neutro Abs: 3302 cells/uL (ref 1500–7800)
Neutrophils Relative %: 63.5 %
Platelets: 195 10*3/uL (ref 140–400)
RBC: 4.39 10*6/uL (ref 4.20–5.80)
RDW: 13.1 % (ref 11.0–15.0)
Total Lymphocyte: 26.6 %
WBC: 5.2 10*3/uL (ref 3.8–10.8)

## 2020-01-26 LAB — COMPREHENSIVE METABOLIC PANEL
AG Ratio: 1.9 (calc) (ref 1.0–2.5)
ALT: 54 U/L — ABNORMAL HIGH (ref 9–46)
AST: 40 U/L — ABNORMAL HIGH (ref 10–35)
Albumin: 4.5 g/dL (ref 3.6–5.1)
Alkaline phosphatase (APISO): 72 U/L (ref 35–144)
BUN/Creatinine Ratio: 10 (calc) (ref 6–22)
BUN: 15 mg/dL (ref 7–25)
CO2: 25 mmol/L (ref 20–32)
Calcium: 9.4 mg/dL (ref 8.6–10.3)
Chloride: 106 mmol/L (ref 98–110)
Creat: 1.47 mg/dL — ABNORMAL HIGH (ref 0.70–1.33)
Globulin: 2.4 g/dL (calc) (ref 1.9–3.7)
Glucose, Bld: 100 mg/dL — ABNORMAL HIGH (ref 65–99)
Potassium: 4.3 mmol/L (ref 3.5–5.3)
Sodium: 139 mmol/L (ref 135–146)
Total Bilirubin: 0.7 mg/dL (ref 0.2–1.2)
Total Protein: 6.9 g/dL (ref 6.1–8.1)

## 2020-01-26 LAB — LACTATE DEHYDROGENASE: LDH: 218 U/L (ref 120–250)

## 2020-01-26 LAB — LIPID PANEL
Cholesterol: 175 mg/dL (ref <200)
HDL: 30 mg/dL — ABNORMAL LOW (ref >=40)
LDL Cholesterol (Calc): 93 mg/dL (calc)
Non-HDL Cholesterol (Calc): 145 mg/dL (calc) — ABNORMAL HIGH (ref <130)
Total CHOL/HDL Ratio: 5.8 (calc) — ABNORMAL HIGH (ref <5.0)
Triglycerides: 393 mg/dL — ABNORMAL HIGH (ref <150)

## 2020-01-26 LAB — PSA: PSA: 2.8 ng/mL (ref <=4.0)

## 2020-01-26 LAB — TSH: TSH: 2.24 mIU/L (ref 0.40–4.50)

## 2020-01-28 ENCOUNTER — Other Ambulatory Visit: Payer: Self-pay | Admitting: Medical Oncology

## 2020-01-28 ENCOUNTER — Other Ambulatory Visit: Payer: Self-pay | Admitting: Internal Medicine

## 2020-01-28 ENCOUNTER — Telehealth: Payer: Self-pay | Admitting: Medical Oncology

## 2020-01-28 ENCOUNTER — Other Ambulatory Visit: Payer: Self-pay | Admitting: Family Medicine

## 2020-01-28 DIAGNOSIS — C9210 Chronic myeloid leukemia, BCR/ABL-positive, not having achieved remission: Secondary | ICD-10-CM

## 2020-01-28 DIAGNOSIS — I10 Essential (primary) hypertension: Secondary | ICD-10-CM

## 2020-01-28 DIAGNOSIS — R7989 Other specified abnormal findings of blood chemistry: Secondary | ICD-10-CM

## 2020-01-28 MED ORDER — IMATINIB MESYLATE 400 MG PO TABS: ORAL_TABLET | ORAL | 0 refills | Status: DC

## 2020-01-28 NOTE — Telephone Encounter (Signed)
Labs done last week by PCP .  Please reorder Gleevec rx and with a quantity of #90 to [facility]

## 2020-01-29 ENCOUNTER — Telehealth: Payer: Self-pay | Admitting: Medical Oncology

## 2020-01-29 NOTE — Telephone Encounter (Signed)
Pt notified that [facility] refilled.

## 2020-01-31 ENCOUNTER — Ambulatory Visit (INDEPENDENT_AMBULATORY_CARE_PROVIDER_SITE_OTHER): Payer: 59 | Admitting: Family Medicine

## 2020-01-31 ENCOUNTER — Other Ambulatory Visit: Payer: Self-pay

## 2020-01-31 ENCOUNTER — Encounter: Payer: Self-pay | Admitting: Family Medicine

## 2020-01-31 DIAGNOSIS — R7989 Other specified abnormal findings of blood chemistry: Secondary | ICD-10-CM

## 2020-01-31 DIAGNOSIS — I10 Essential (primary) hypertension: Secondary | ICD-10-CM

## 2020-02-01 LAB — BASIC METABOLIC PANEL
BUN/Creatinine Ratio: 10 (calc) (ref 6–22)
BUN: 14 mg/dL (ref 7–25)
CO2: 25 mmol/L (ref 20–32)
Calcium: 9.3 mg/dL (ref 8.6–10.3)
Chloride: 103 mmol/L (ref 98–110)
Creat: 1.36 mg/dL — ABNORMAL HIGH (ref 0.70–1.33)
Glucose, Bld: 96 mg/dL (ref 65–99)
Potassium: 4.1 mmol/L (ref 3.5–5.3)
Sodium: 139 mmol/L (ref 135–146)

## 2020-02-01 LAB — URINALYSIS, ROUTINE W REFLEX MICROSCOPIC
Bilirubin Urine: NEGATIVE
Glucose, UA: NEGATIVE
Hgb urine dipstick: NEGATIVE
Ketones, ur: NEGATIVE
Leukocytes,Ua: NEGATIVE
Nitrite: NEGATIVE
Protein, ur: NEGATIVE
Specific Gravity, Urine: 1.003 (ref 1.001–1.03)
pH: 7 (ref 5.0–8.0)

## 2020-02-08 ENCOUNTER — Ambulatory Visit (INDEPENDENT_AMBULATORY_CARE_PROVIDER_SITE_OTHER): Payer: 59 | Admitting: Family Medicine

## 2020-02-08 ENCOUNTER — Encounter: Payer: Self-pay | Admitting: Family Medicine

## 2020-02-08 ENCOUNTER — Other Ambulatory Visit: Payer: Self-pay

## 2020-02-08 VITALS — BP 139/81 | HR 68

## 2020-02-08 DIAGNOSIS — E781 Pure hyperglyceridemia: Secondary | ICD-10-CM

## 2020-02-08 DIAGNOSIS — R7989 Other specified abnormal findings of blood chemistry: Secondary | ICD-10-CM | POA: Diagnosis not present

## 2020-02-08 DIAGNOSIS — R7401 Elevation of levels of liver transaminase levels: Secondary | ICD-10-CM

## 2020-02-08 DIAGNOSIS — I10 Essential (primary) hypertension: Secondary | ICD-10-CM

## 2020-02-08 MED ORDER — AMLODIPINE BESY-BENAZEPRIL HCL 5-20 MG PO CAPS: 1.0000 | ORAL_CAPSULE | Freq: Every day | ORAL | 3 refills | Status: DC

## 2020-02-08 NOTE — Progress Notes (Signed)
Virtual Visit via Video Note  I connected with pt on 02/08/20 at  4:00 PM EST by a video enabled telemedicine application and verified that I am speaking with the correct person using two identifiers.  Location patient: home Location provider:work or home office Persons participating in the virtual visit: patient, provider  I discussed the limitations of evaluation and management by telemedicine and the availability of in person appointments. The patient expressed understanding and agreed to proceed.  Telemedicine visit is a necessity given the COVID-19 restrictions in place at the current time.  HPI: 54 y/o WM being seen for 2 wk f/u HTN and elevated sCr. Last visit I started him on amlod/benaz 5-20 qd.  Interim hx: Labs showed sCr 1.47, then repeat 1.36 a week later, UA w/out blood or protein.  His baseline sCr has been 1.2-1.3 dating back to 2010.  Creatinine at last CPE1/24/20 was 1.41 (GFR around 55-60 ml/min).   He had never had a w/u for elevated sCr so I planned on doing renal u/s as well as renal artery dopplers, esp in light of his HTN.  However, wanted to discuss further at today's visit before starting any of this type of imaging w/u.  States he feels good.  We went over his recent labs and discussed the abnormalities. He has made signif dietary adjustments, specifically cutting out lots of starchy foods and fast food, increasing low fat dairy and green leafy vegetables.  He has lost 5 lbs in the last 7-10d. He is concerned about his triglycerides.  Sister and dad have high cholesterol. Looking back in labs in EMR his trigs were over 700 on one measurement a few years ago and over 300 on another measurement prior to that (unknown if fasting or not).   Home bp's 120s/70s much of the time. No side effects from bp med.   ROS: no fevers, no CP, no SOB, no wheezing, no cough, no dizziness, no HAs, no rashes, no melena/hematochezia.  No polyuria or polydipsia.  No myalgias or  arthralgias.   Past Medical History:  Diagnosis Date  . Anal fistula   . Chronic renal insufficiency, stage 2 (mild)    GFR around 60-65 (sCr 1.2-1.3 avg from 2010-2018). Slight inc in sCr to 1.35-1.4 avg 2019-2021.  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  . Essential hypertension    started med 01/2020  . History of pneumonia approx 2014  . Hypertriglyceridemia   . Liver cyst    Left hepatic dome 2 cm cyst, CT Sep2013  . Pruritus ani, intermittent 01/17/2013  . Splenomegaly    resolved CT Sep2013-->this is how his CML was detected/diagnosed    Past Surgical History:  Procedure Laterality Date  . COLONOSCOPY  01/09/2018   mild sigmoid diverticulosis, otherwise normal.  Recall 10 yrs.  [name] REFRACTIVE SURGERY  2000  . TOENAIL EXCISION  01/16/2013   ingrown toenail- not removed   . TONSILECTOMY, ADENOIDECTOMY, BILATERAL MYRINGOTOMY AND TUBES     age 64  . TREATMENT FISTULA ANAL  2015   Anal fistula surgically repaired.    Family History  Problem Relation Age of Onset  . Heart disease Mother        died from a  reaction to heparin   . Cancer Father 70       prostate  . Prostate cancer Father   . Heart disease Father   . Pulmonary fibrosis Father   . Colon cancer  Neg Hx   . Colon polyps Neg Hx      Current Outpatient Medications:  .  amLODipine-benazepril (LOTREL) 5-20 MG capsule, Take 1 capsule by mouth daily., Disp: 30 capsule, Rfl: 0 .  imatinib (GLEEVEC) 400 MG tablet, TAKE 1 TABLET BY MOUTH ONCE DAILY AT THE SAME TIME WITH FOOD AND A LARGE GLASS OF WATER. DO NOT CRUSH TABLETS. AVOID GRAPEFRUIT PRODUCTS., Disp: 90 tablet, Rfl: 0  EXAM:  VITALS per patient if applicable: BP 123456 (BP Location: Left Arm, Patient Position: Sitting, Cuff Size: Large)   Pulse 68    GENERAL: alert, oriented, appears well and in no acute distress  HEENT: atraumatic, conjunttiva clear, no obvious abnormalities on inspection of external nose and  ears  NECK: normal movements of the head and neck  LUNGS: on inspection no signs of respiratory distress, breathing rate appears normal, no obvious gross SOB, gasping or wheezing  CV: no obvious cyanosis  MS: moves all visible extremities without noticeable abnormality  PSYCH/NEURO: pleasant and cooperative, no obvious depression or anxiety, speech and thought processing grossly intact  LABS: none today  Lab Results  Component Value Date   TSH 2.24 01/25/2020   Lab Results  Component Value Date   WBC 5.2 01/25/2020   HGB 14.8 01/25/2020   HCT 41.5 01/25/2020   MCV 94.5 01/25/2020   PLT 195 01/25/2020   Lab Results  Component Value Date   CREATININE 1.36 (H) 01/31/2020   BUN 14 01/31/2020   NA 139 01/31/2020   K 4.1 01/31/2020   CL 103 01/31/2020   CO2 25 01/31/2020   Lab Results  Component Value Date   ALT 54 (H) 01/25/2020   AST 40 (H) 01/25/2020   ALKPHOS 79 08/30/2018   BILITOT 0.7 01/25/2020   Lab Results  Component Value Date   CHOL 175 01/25/2020   Lab Results  Component Value Date   HDL 30 (L) 01/25/2020   Lab Results  Component Value Date   LDLCALC 93 01/25/2020   Lab Results  Component Value Date   TRIG 393 (H) 01/25/2020   Lab Results  Component Value Date   CHOLHDL 5.8 (H) 01/25/2020   Lab Results  Component Value Date   PSA 2.8 01/25/2020   PSA 2.4 01/12/2019   PSA 1.89 01/07/2016    ASSESSMENT AND PLAN:  Discussed the following assessment and plan:  1) HTN: recent dx, just started med and is doing great.  NO changes at this time. Will be keeping closer eye on sCr -->recheck this 2 mo.  2) Elevated sCr: long hx of this, stable.  This remained stable after being on lotrel for a week. He may be a person who has normal renal function but his sCr falls outside ULN. Decided NOT to pursue renal imaging/renal dopplers. Will monitor BMET again in 37mo.  3) Hypertriglyceridemia: diet/exercise for now. We discussed this in depth  today. Plan repeat lipid panel 2 mo.  4) Mild LFTs elevation: suspect he has some fatty liver (elev trigs). TLC as above.   I discussed the assessment and treatment plan with the patient. The patient was provided an opportunity to ask questions and all were answered. The patient agreed with the plan and demonstrated an understanding of the instructions.   The patient was advised to call back or seek an in-person evaluation if the symptoms worsen or if the condition fails to improve as anticipated.  F/u: fasting lab visit approx 2 mo, pt to call to schedule when  he is ready and he'll schedule telemed visit to be shortly after lab draw so we can go over results together.  Signed:  [name], MD           02/08/2020

## 2020-02-16 ENCOUNTER — Other Ambulatory Visit: Payer: Self-pay | Admitting: Family Medicine

## 2020-02-18 ENCOUNTER — Telehealth: Payer: Self-pay | Admitting: Medical Oncology

## 2020-02-18 ENCOUNTER — Other Ambulatory Visit: Payer: Self-pay | Admitting: Medical Oncology

## 2020-02-18 DIAGNOSIS — C9210 Chronic myeloid leukemia, BCR/ABL-positive, not having achieved remission: Secondary | ICD-10-CM

## 2020-02-18 MED ORDER — IMATINIB MESYLATE 400 MG PO TABS: ORAL_TABLET | ORAL | 0 refills | Status: DC

## 2020-02-18 NOTE — Telephone Encounter (Signed)
Per [facility], [facility]vec 400 mg /day #30 with 3 refills   is  authorized from 12/10/19-12/05/20 -Auth number [id number].   In order to get #90 tablets there must be an "override".   [facility] said [facility] plan will not allow #90 tablets but to contact  [facility] for the override or make an  accomodation for #90 tablets/3 months. Myon is going to call [place].[phone])

## 2020-04-30 ENCOUNTER — Other Ambulatory Visit: Payer: Self-pay | Admitting: Internal Medicine

## 2020-04-30 DIAGNOSIS — C9210 Chronic myeloid leukemia, BCR/ABL-positive, not having achieved remission: Secondary | ICD-10-CM

## 2020-05-20 ENCOUNTER — Telehealth: Payer: Self-pay | Admitting: Medical Oncology

## 2020-05-20 NOTE — Telephone Encounter (Signed)
Please remind [place] when he needs refill in the future.  Thank you.

## 2020-05-20 NOTE — Telephone Encounter (Addendum)
New insurance- [employer] out of area -[place]- Is [facility] in network? ID # [id number] H Message sent to [place] quantity coverage- [employer] will authorize #90 tablets for his future Gleevec refills.[facility]

## 2020-05-21 ENCOUNTER — Other Ambulatory Visit: Payer: Self-pay | Admitting: Medical Oncology

## 2020-05-21 DIAGNOSIS — C9210 Chronic myeloid leukemia, BCR/ABL-positive, not having achieved remission: Secondary | ICD-10-CM

## 2020-05-21 NOTE — Telephone Encounter (Addendum)
Pt notified that he has to contact [employer] to see if he is in network. He received a 30 day supply of [place] recently . He said [facility] faxed a request for medicaiton fill for gleevec yesterday.

## 2020-05-22 ENCOUNTER — Other Ambulatory Visit: Payer: Self-pay | Admitting: Medical Oncology

## 2020-05-22 ENCOUNTER — Telehealth: Payer: Self-pay | Admitting: Medical Oncology

## 2020-05-22 DIAGNOSIS — C9210 Chronic myeloid leukemia, BCR/ABL-positive, not having achieved remission: Secondary | ICD-10-CM

## 2020-05-22 MED ORDER — IMATINIB MESYLATE 400 MG PO TABS: ORAL_TABLET | ORAL | 2 refills | Status: DC

## 2020-05-22 NOTE — Telephone Encounter (Signed)
Pt new insurance will only allow Imatinib  #30 tablets /30 days. Pt will talk to his employer to see if they will allow #90 tablets .

## 2021-01-26 ENCOUNTER — Other Ambulatory Visit: Payer: Self-pay | Admitting: Family Medicine

## 2021-02-12 ENCOUNTER — Telehealth: Payer: Self-pay

## 2021-02-12 ENCOUNTER — Other Ambulatory Visit: Payer: Self-pay

## 2021-02-12 MED ORDER — AMLODIPINE BESY-BENAZEPRIL HCL 5-20 MG PO CAPS: 1.0000 | ORAL_CAPSULE | Freq: Every day | ORAL | 0 refills | Status: DC

## 2021-02-12 NOTE — Telephone Encounter (Signed)
Patient refill request --- sent to wrong pharmacy. His mail order pharmacy will not fill 30 d/s meds. He wanted to know why only 30 d/s was called in.  I told patient appt required before filling. He understood. Patient scheduled CPE with [facility] on 3/14.   [facility].  amLODipine-benazepril

## 2021-02-12 NOTE — Telephone Encounter (Signed)
Rx sent for 30 day supply. LM advising refill sent

## 2021-02-17 DIAGNOSIS — R972 Elevated prostate specific antigen [PSA]: Secondary | ICD-10-CM

## 2021-02-17 HISTORY — DX: Elevated prostate specific antigen (PSA): R97.20

## 2021-03-02 ENCOUNTER — Encounter: Payer: Self-pay | Admitting: Family Medicine

## 2021-03-02 ENCOUNTER — Other Ambulatory Visit: Payer: Self-pay

## 2021-03-02 ENCOUNTER — Ambulatory Visit (INDEPENDENT_AMBULATORY_CARE_PROVIDER_SITE_OTHER): Payer: BC Managed Care – PPO | Admitting: Family Medicine

## 2021-03-02 VITALS — BP 130/80 | HR 67 | Temp 97.9°F | Resp 16 | Ht 70.75 in | Wt 195.0 lb

## 2021-03-02 DIAGNOSIS — Z0000 Encounter for general adult medical examination without abnormal findings: Secondary | ICD-10-CM | POA: Diagnosis not present

## 2021-03-02 DIAGNOSIS — C9210 Chronic myeloid leukemia, BCR/ABL-positive, not having achieved remission: Secondary | ICD-10-CM

## 2021-03-02 DIAGNOSIS — E781 Pure hyperglyceridemia: Secondary | ICD-10-CM

## 2021-03-02 DIAGNOSIS — N183 Chronic kidney disease, stage 3 unspecified: Secondary | ICD-10-CM

## 2021-03-02 DIAGNOSIS — Z125 Encounter for screening for malignant neoplasm of prostate: Secondary | ICD-10-CM | POA: Diagnosis not present

## 2021-03-02 DIAGNOSIS — I10 Essential (primary) hypertension: Secondary | ICD-10-CM

## 2021-03-02 NOTE — Progress Notes (Signed)
Office Note 03/02/2021  CC:  Chief Complaint  Patient presents with  . Annual Exam    Pt is not fasting   HPI:  Michael [name] is a 55 y.o. male who is here for f/u HTN, CRI II, and annual health maintenance exam. A/P as of last visit: "1) HTN: recent dx, just started med and is doing great.  NO changes at this time. Will be keeping closer eye on sCr -->recheck this 2 mo.  2) Elevated sCr: long hx of this, stable.  This remained stable after being on lotrel for a week. He may be a person who has normal renal function but his sCr falls outside ULN. Decided NOT to pursue renal imaging/renal dopplers. Will monitor BMET again in 71mo.  3) Hypertriglyceridemia: diet/exercise for now. We discussed this in depth today. Plan repeat lipid panel 2 mo.  4) Mild LFTs elevation: suspect he has some fatty liver (elev trigs). TLC as above."  INTERIM HX: Feeling well. Staying active. Good diet sometimes and other times not.  Still playing lots of golf. No home bp measurements.  He is compliant with lotrel.  Past Medical History:  Diagnosis Date  . Chronic renal insufficiency, stage 2 (mild)    GFR around 60-65 (sCr 1.2-1.3 avg from 2010-2018). Slight inc in sCr to 1.35-1.4 avg 2019-2021.  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  . Essential hypertension    started med 01/2020  . History of pneumonia approx 2014  . Hypertriglyceridemia   . Liver cyst    Left hepatic dome 2 cm cyst, CT Sep2013  . Metabolic syndrome    elev trigs, low HDL, HTN  . Pruritus ani, intermittent 01/17/2013  . Splenomegaly    resolved CT Sep2013-->this is how his CML was detected/diagnosed    Past Surgical History:  Procedure Laterality Date  . COLONOSCOPY  01/09/2018   mild sigmoid diverticulosis, otherwise normal.  Recall 10 yrs.  [name] REFRACTIVE SURGERY  2000  . TOENAIL EXCISION  01/16/2013   ingrown toenail- not removed   . TONSILECTOMY, ADENOIDECTOMY,  BILATERAL MYRINGOTOMY AND TUBES     age 29  . TREATMENT FISTULA ANAL  2015   Anal fistula surgically repaired.    Family History  Problem Relation Age of Onset  . Heart disease Mother        died from a  reaction to heparin   . Cancer Father 9       prostate  . Prostate cancer Father   . Heart disease Father   . Pulmonary fibrosis Father   . Colon cancer Neg Hx   . Colon polyps Neg Hx     Social History   Socioeconomic History  . Marital status: Married    Spouse name: Not on file  . Number of children: Not on file  . Years of education: Not on file  . Highest education level: Not on file  Occupational History  . Not on file  Tobacco Use  . Smoking status: Never Smoker  . Smokeless tobacco: Never Used  Vaping Use  . Vaping Use: Never used  Substance and Sexual Activity  . Alcohol use: Yes    Comment: occasional   . Drug use: No  . Sexual activity: Not on file  Other Topics Concern  . Not on file  Social History Narrative   Married, 1 son and 1 daughter.   Orig from [place].   Grad from [employer].  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   No T/A/Ds.   Social Determinants of Health   Financial Resource Strain: Not on file  Food Insecurity: Not on file  Transportation Needs: Not on file  Physical Activity: Not on file  Stress: Not on file  Social Connections: Not on file  Intimate Partner Violence: Not on file    Outpatient Medications Prior to Visit  Medication Sig Dispense Refill  . amLODipine-benazepril (LOTREL) 5-20 MG capsule Take 1 capsule by mouth daily. 30 capsule 0  . imatinib (GLEEVEC) 400 MG tablet Take with meals and large glass of water.Caution:Chemotherapy. 30 tablet 2   No facility-administered medications prior to visit.   No Known Allergies  ROS Review of Systems  Constitutional: Negative for appetite change, chills, fatigue and fever.  HENT: Negative for congestion, dental problem, ear pain and sore  throat.   Eyes: Negative for discharge, redness and visual disturbance.  Respiratory: Negative for cough, chest tightness, shortness of breath and wheezing.   Cardiovascular: Negative for chest pain, palpitations and leg swelling.  Gastrointestinal: Negative for abdominal pain, blood in stool, diarrhea, nausea and vomiting.  Genitourinary: Negative for difficulty urinating, dysuria, flank pain, frequency, hematuria and urgency.  Musculoskeletal: Negative for arthralgias, back pain, joint swelling, myalgias and neck stiffness.  Skin: Negative for pallor and rash.  Neurological: Negative for dizziness, speech difficulty, weakness and headaches.  Hematological: Negative for adenopathy. Does not bruise/bleed easily.  Psychiatric/Behavioral: Negative for confusion and sleep disturbance. The patient is not nervous/anxious.     PE; Vitals with BMI 03/02/2021 02/08/2020 01/25/2020  Height 5' 10.75" - 5\' 10"   Weight 195 lbs - 200 lbs  BMI 44.31 - 54.0  Systolic 086 761 950  Diastolic 80 81 932  Pulse 67 68 72     Gen: Alert, well appearing.  Patient is oriented to person, place, time, and situation. AFFECT: pleasant, lucid thought and speech. ENT: Ears: EACs clear, normal epithelium.  TMs with good light reflex and landmarks bilaterally.  Eyes: no injection, icteris, swelling, or exudate.  EOMI, PERRLA. Nose: no drainage or turbinate edema/swelling.  No injection or focal lesion.  Mouth: lips without lesion/swelling.  Oral mucosa pink and moist.  Dentition intact and without obvious caries or gingival swelling.  Oropharynx without erythema, exudate, or swelling.  Neck: supple/nontender.  No LAD, mass, or TM.  Carotid pulses 2+ bilaterally, without bruits. CV: RRR, no m/r/g.   LUNGS: CTA bilat, nonlabored resps, good aeration in all lung fields. ABD: soft, NT, ND, BS normal.  No hepatospenomegaly or mass.  No bruits. EXT: no clubbing, cyanosis, or edema.  Musculoskeletal: no joint swelling,  erythema, warmth, or tenderness.  ROM of all joints intact. Skin - no sores or suspicious lesions or rashes or color changes   Pertinent labs:  Lab Results  Component Value Date   TSH 2.24 01/25/2020   Lab Results  Component Value Date   WBC 5.2 01/25/2020   HGB 14.8 01/25/2020   HCT 41.5 01/25/2020   MCV 94.5 01/25/2020   PLT 195 01/25/2020   Lab Results  Component Value Date   CREATININE 1.36 (H) 01/31/2020   BUN 14 01/31/2020   NA 139 01/31/2020   K 4.1 01/31/2020   CL 103 01/31/2020   CO2 25 01/31/2020   Lab Results  Component Value Date   ALT 54 (H) 01/25/2020   AST 40 (H) 01/25/2020   ALKPHOS 79 08/30/2018   BILITOT 0.7 01/25/2020   Lab Results  Component Value Date   CHOL 175 01/25/2020   Lab Results  Component Value Date   HDL 30 (L) 01/25/2020   Lab Results  Component Value Date   LDLCALC 93 01/25/2020   Lab Results  Component Value Date   TRIG 393 (H) 01/25/2020   Lab Results  Component Value Date   CHOLHDL 5.8 (H) 01/25/2020   Lab Results  Component Value Date   PSA 2.8 01/25/2020   PSA 2.4 01/12/2019   PSA 1.89 01/07/2016    ASSESSMENT AND PLAN:   1) HTN: stable. Cont lotrel 5-20, 1 qd. Lytes/cr future.  2) Hypertriglyceridemia: diet is fair, activity/exercise good. FLP future.  2) Elevated sCr: long hx of this, stable.  This remained stable after being on lotrel for a week. He may be a person who has normal renal function but his sCr falls outside ULN. Through shared decision making process we decided NOT to pursue renal imaging/renal dopplers last year. BMET future.  4) Health maintenance exam: Reviewed age and gender appropriate health maintenance issues (prudent diet, regular exercise, health risks of tobacco and excessive alcohol, use of seatbelts, fire alarms in home, use of sunscreen).  Also reviewed age and gender appropriate health screening as well as vaccine recommendations. Vaccines:  ALL UTD. Labs: fasting HP  ordered--future when fasting. Prostate ca screening: PSA ordered future. Colon ca screening: recall 2029.  An After Visit Summary was printed and given to the patient.  FOLLOW UP:  Return in about 1 year (around 03/02/2022) for annual CPE (fasting).  Signed:  [name], MD           03/02/2021

## 2021-03-02 NOTE — Patient Instructions (Signed)

## 2021-03-08 ENCOUNTER — Other Ambulatory Visit: Payer: Self-pay | Admitting: Family Medicine

## 2021-03-09 ENCOUNTER — Other Ambulatory Visit: Payer: Self-pay | Admitting: Family Medicine

## 2021-03-09 ENCOUNTER — Other Ambulatory Visit: Payer: Self-pay | Admitting: Internal Medicine

## 2021-03-09 DIAGNOSIS — C9210 Chronic myeloid leukemia, BCR/ABL-positive, not having achieved remission: Secondary | ICD-10-CM

## 2021-03-12 ENCOUNTER — Other Ambulatory Visit: Payer: Self-pay

## 2021-03-12 ENCOUNTER — Ambulatory Visit (INDEPENDENT_AMBULATORY_CARE_PROVIDER_SITE_OTHER): Payer: BC Managed Care – PPO

## 2021-03-12 DIAGNOSIS — R972 Elevated prostate specific antigen [PSA]: Secondary | ICD-10-CM

## 2021-03-12 DIAGNOSIS — E781 Pure hyperglyceridemia: Secondary | ICD-10-CM

## 2021-03-12 DIAGNOSIS — Z125 Encounter for screening for malignant neoplasm of prostate: Secondary | ICD-10-CM

## 2021-03-12 DIAGNOSIS — C9210 Chronic myeloid leukemia, BCR/ABL-positive, not having achieved remission: Secondary | ICD-10-CM

## 2021-03-12 DIAGNOSIS — N183 Chronic kidney disease, stage 3 unspecified: Secondary | ICD-10-CM

## 2021-03-12 DIAGNOSIS — N2889 Other specified disorders of kidney and ureter: Secondary | ICD-10-CM

## 2021-03-12 LAB — LIPID PANEL
Cholesterol: 181 mg/dL (ref 0–200)
HDL: 34.5 mg/dL — ABNORMAL LOW (ref >39.00)
NonHDL: 146.97
Total CHOL/HDL Ratio: 5
Triglycerides: 230 mg/dL — ABNORMAL HIGH (ref 0.0–149.0)
VLDL: 46 mg/dL — ABNORMAL HIGH (ref 0.0–40.0)

## 2021-03-12 LAB — COMPREHENSIVE METABOLIC PANEL
ALT: 34 U/L (ref 0–53)
AST: 32 U/L (ref 0–37)
Albumin: 4.7 g/dL (ref 3.5–5.2)
Alkaline Phosphatase: 73 U/L (ref 39–117)
BUN: 16 mg/dL (ref 6–23)
CO2: 28 mEq/L (ref 19–32)
Calcium: 8.9 mg/dL (ref 8.4–10.5)
Chloride: 106 mEq/L (ref 96–112)
Creatinine, Ser: 1.34 mg/dL (ref 0.40–1.50)
GFR: 59.46 mL/min — ABNORMAL LOW (ref >60.00)
Glucose, Bld: 107 mg/dL — ABNORMAL HIGH (ref 70–99)
Potassium: 4.1 mEq/L (ref 3.5–5.1)
Sodium: 140 mEq/L (ref 135–145)
Total Bilirubin: 0.8 mg/dL (ref 0.2–1.2)
Total Protein: 7 g/dL (ref 6.0–8.3)

## 2021-03-12 LAB — PSA: PSA: 3.65 ng/mL (ref 0.10–4.00)

## 2021-03-12 LAB — CBC WITH DIFFERENTIAL/PLATELET
Basophils Absolute: 0 10*3/uL (ref 0.0–0.1)
Basophils Relative: 1 % (ref 0.0–3.0)
Eosinophils Absolute: 0 10*3/uL (ref 0.0–0.7)
Eosinophils Relative: 0.9 % (ref 0.0–5.0)
HCT: 41.3 % (ref 39.0–52.0)
Hemoglobin: 14.2 g/dL (ref 13.0–17.0)
Lymphocytes Relative: 37 % (ref 12.0–46.0)
Lymphs Abs: 1.6 10*3/uL (ref 0.7–4.0)
MCHC: 34.5 g/dL (ref 30.0–36.0)
MCV: 96.3 fl (ref 78.0–100.0)
Monocytes Absolute: 0.3 10*3/uL (ref 0.1–1.0)
Monocytes Relative: 7.9 % (ref 3.0–12.0)
Neutro Abs: 2.3 10*3/uL (ref 1.4–7.7)
Neutrophils Relative %: 53.2 % (ref 43.0–77.0)
Platelets: 178 10*3/uL (ref 150.0–400.0)
RBC: 4.29 Mil/uL (ref 4.22–5.81)
RDW: 13.6 % (ref 11.5–15.5)
WBC: 4.3 10*3/uL (ref 4.0–10.5)

## 2021-03-12 LAB — TSH: TSH: 2.33 u[IU]/mL (ref 0.35–4.50)

## 2021-03-12 LAB — LDL CHOLESTEROL, DIRECT: Direct LDL: 86 mg/dL

## 2021-03-13 ENCOUNTER — Encounter: Payer: Self-pay | Admitting: Family Medicine

## 2021-03-13 ENCOUNTER — Other Ambulatory Visit (INDEPENDENT_AMBULATORY_CARE_PROVIDER_SITE_OTHER): Payer: BC Managed Care – PPO

## 2021-03-13 DIAGNOSIS — R7301 Impaired fasting glucose: Secondary | ICD-10-CM

## 2021-03-13 LAB — HEMOGLOBIN A1C: Hgb A1c MFr Bld: 5.5 % (ref 4.6–6.5)

## 2021-03-16 NOTE — Addendum Note (Signed)
Addended by: Octaviano Glow on: 03/16/2021 08:56 AM   Modules accepted: Orders

## 2021-04-22 ENCOUNTER — Telehealth: Payer: Self-pay

## 2021-04-22 NOTE — Telephone Encounter (Signed)
[facility] - Client Nonclinical Telephone Record  [facility] - Client Client Site [facility] - Day Physician [name] - MD Contact Type Call Who Is Calling Patient / Member / Family / Caregiver Caller Name [name] Phone Number [phone] Patient Name Michael [name] Patient DOB [birth year] Call Type Message Only Information Provided Reason for Call Request for General Office Information Initial Comment Caller would like to speak with Dr. Anitra Lauth after reading some articles about creatine. Caller lifts weights and has plateaued. Caller would like an opinion. Additional Comment Caller would like to speak with Dr. Anitra Lauth about adding a supplement to his routine and if it would help/interact with any medications he is on. Disp. Time Disposition Final User 04/22/2021 7:56:58 AM General Information Provided Yes Gerhard Perches Call Closed By: Gerhard Perches Transaction Date/Time: 04/22/2021 7:54:10 AM (ET)

## 2021-04-22 NOTE — Telephone Encounter (Signed)
Upon reconsideration and further research, I actually think it is fine for pt to take creatine supplement. I was initially concerned it may impair GFR.  However, while it MAY raise his sCr level, this is rarely an indication that his GFR is decreased in this type of scenario---it simply reflects increase in creatinine production. I called the patient today and told him I was wrong with my initial recommendation and I told him to go ahead and take the creatine supplement. We then cancelled the virtual visit he had set up. Signed:  [name], MD           04/22/2021

## 2021-04-22 NOTE — Telephone Encounter (Signed)
Recent creatinine from 03/24 labs was 1.34. Spoke with pt regarding this and he would like to know if it safe for him to take creatinine supplement to help muscle support/strength. He would also like to know if this will be interfere with current medications.  Please advise.

## 2021-04-22 NOTE — Telephone Encounter (Signed)
Spoke with pt regarding recommendation. Pt has been scheduled for virtual visit to further discuss why it it not recommended.

## 2021-04-22 NOTE — Telephone Encounter (Signed)
I recommend he NOT take creatine supplement.-thx

## 2021-05-04 ENCOUNTER — Telehealth: Payer: BC Managed Care – PPO | Admitting: Family Medicine

## 2021-05-14 ENCOUNTER — Other Ambulatory Visit: Payer: Self-pay | Admitting: Internal Medicine

## 2021-05-14 DIAGNOSIS — C9210 Chronic myeloid leukemia, BCR/ABL-positive, not having achieved remission: Secondary | ICD-10-CM

## 2021-07-10 ENCOUNTER — Other Ambulatory Visit (HOSPITAL_COMMUNITY): Payer: Self-pay

## 2021-07-20 ENCOUNTER — Other Ambulatory Visit: Payer: Self-pay | Admitting: Internal Medicine

## 2021-07-20 DIAGNOSIS — C9210 Chronic myeloid leukemia, BCR/ABL-positive, not having achieved remission: Secondary | ICD-10-CM

## 2021-09-04 ENCOUNTER — Other Ambulatory Visit: Payer: Self-pay | Admitting: Family Medicine

## 2021-09-17 ENCOUNTER — Other Ambulatory Visit: Payer: Self-pay

## 2021-09-17 ENCOUNTER — Ambulatory Visit (INDEPENDENT_AMBULATORY_CARE_PROVIDER_SITE_OTHER): Payer: BC Managed Care – PPO

## 2021-09-17 DIAGNOSIS — R972 Elevated prostate specific antigen [PSA]: Secondary | ICD-10-CM

## 2021-09-17 LAB — PSA: PSA: 3.03 ng/mL (ref 0.10–4.00)

## 2021-09-18 ENCOUNTER — Other Ambulatory Visit: Payer: Self-pay | Admitting: Internal Medicine

## 2021-09-18 DIAGNOSIS — C9210 Chronic myeloid leukemia, BCR/ABL-positive, not having achieved remission: Secondary | ICD-10-CM

## 2021-11-30 ENCOUNTER — Other Ambulatory Visit: Payer: Self-pay | Admitting: Internal Medicine

## 2021-11-30 DIAGNOSIS — C9210 Chronic myeloid leukemia, BCR/ABL-positive, not having achieved remission: Secondary | ICD-10-CM

## 2021-12-03 DIAGNOSIS — Z20822 Contact with and (suspected) exposure to covid-19: Secondary | ICD-10-CM | POA: Diagnosis not present

## 2021-12-03 DIAGNOSIS — Z03818 Encounter for observation for suspected exposure to other biological agents ruled out: Secondary | ICD-10-CM | POA: Diagnosis not present

## 2022-02-05 ENCOUNTER — Telehealth: Payer: Self-pay

## 2022-02-05 NOTE — Telephone Encounter (Signed)
Fax received from [facility] with a refill request for [place].

## 2022-02-08 ENCOUNTER — Other Ambulatory Visit: Payer: Self-pay | Admitting: Internal Medicine

## 2022-02-08 ENCOUNTER — Telehealth: Payer: Self-pay | Admitting: Medical Oncology

## 2022-02-08 ENCOUNTER — Other Ambulatory Visit: Payer: Self-pay | Admitting: Medical Oncology

## 2022-02-08 DIAGNOSIS — C9210 Chronic myeloid leukemia, BCR/ABL-positive, not having achieved remission: Secondary | ICD-10-CM

## 2022-02-08 NOTE — Telephone Encounter (Signed)
Lab and f/u scheduled for tomorrow.

## 2022-02-08 NOTE — Telephone Encounter (Signed)
I have called the pt and left a detailed message advising he needs to be seen before a refill can be authorized. I have advised the pt to contact our scheduling department to schedule that appt if he would like. I have also left the contact number to the CC.

## 2022-02-09 ENCOUNTER — Other Ambulatory Visit: Payer: Self-pay

## 2022-02-09 ENCOUNTER — Inpatient Hospital Stay (HOSPITAL_BASED_OUTPATIENT_CLINIC_OR_DEPARTMENT_OTHER): Payer: BC Managed Care – PPO | Admitting: Internal Medicine

## 2022-02-09 ENCOUNTER — Inpatient Hospital Stay: Payer: BC Managed Care – PPO | Attending: Internal Medicine

## 2022-02-09 ENCOUNTER — Encounter: Payer: Self-pay | Admitting: Internal Medicine

## 2022-02-09 VITALS — BP 123/85 | HR 61 | Temp 97.4°F | Resp 16 | Wt 198.3 lb

## 2022-02-09 DIAGNOSIS — C9210 Chronic myeloid leukemia, BCR/ABL-positive, not having achieved remission: Secondary | ICD-10-CM | POA: Insufficient documentation

## 2022-02-09 DIAGNOSIS — Z5181 Encounter for therapeutic drug level monitoring: Secondary | ICD-10-CM

## 2022-02-09 LAB — CBC WITH DIFFERENTIAL (CANCER CENTER ONLY)
Abs Immature Granulocytes: 0 10*3/uL (ref 0.00–0.07)
Basophils Absolute: 0 10*3/uL (ref 0.0–0.1)
Basophils Relative: 1 %
Eosinophils Absolute: 0 10*3/uL (ref 0.0–0.5)
Eosinophils Relative: 1 %
HCT: 37.1 % — ABNORMAL LOW (ref 39.0–52.0)
Hemoglobin: 13.1 g/dL (ref 13.0–17.0)
Immature Granulocytes: 0 %
Lymphocytes Relative: 35 %
Lymphs Abs: 1.1 10*3/uL (ref 0.7–4.0)
MCH: 33.2 pg (ref 26.0–34.0)
MCHC: 35.3 g/dL (ref 30.0–36.0)
MCV: 93.9 fL (ref 80.0–100.0)
Monocytes Absolute: 0.3 10*3/uL (ref 0.1–1.0)
Monocytes Relative: 9 %
Neutro Abs: 1.8 10*3/uL (ref 1.7–7.7)
Neutrophils Relative %: 54 %
Platelet Count: 155 10*3/uL (ref 150–400)
RBC: 3.95 MIL/uL — ABNORMAL LOW (ref 4.22–5.81)
RDW: 12.6 % (ref 11.5–15.5)
WBC Count: 3.3 10*3/uL — ABNORMAL LOW (ref 4.0–10.5)
nRBC: 0 % (ref 0.0–0.2)

## 2022-02-09 LAB — CMP (CANCER CENTER ONLY)
ALT: 31 U/L (ref 0–44)
AST: 36 U/L (ref 15–41)
Albumin: 4.2 g/dL (ref 3.5–5.0)
Alkaline Phosphatase: 69 U/L (ref 38–126)
Anion gap: 5 (ref 5–15)
BUN: 16 mg/dL (ref 6–20)
CO2: 27 mmol/L (ref 22–32)
Calcium: 8.9 mg/dL (ref 8.9–10.3)
Chloride: 108 mmol/L (ref 98–111)
Creatinine: 1.61 mg/dL — ABNORMAL HIGH (ref 0.61–1.24)
GFR, Estimated: 50 mL/min — ABNORMAL LOW (ref >60)
Glucose, Bld: 99 mg/dL (ref 70–99)
Potassium: 4.1 mmol/L (ref 3.5–5.1)
Sodium: 140 mmol/L (ref 135–145)
Total Bilirubin: 0.9 mg/dL (ref 0.3–1.2)
Total Protein: 6.5 g/dL (ref 6.5–8.1)

## 2022-02-09 LAB — LACTATE DEHYDROGENASE: LDH: 199 U/L — ABNORMAL HIGH (ref 98–192)

## 2022-02-09 MED ORDER — IMATINIB MESYLATE 400 MG PO TABS: ORAL_TABLET | ORAL | 5 refills | Status: DC

## 2022-02-09 NOTE — Progress Notes (Signed)
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  PRIOR THERAPY: None  CURRENT THERAPY: Gleevec 400 mg by mouth daily started February 2009  INTERVAL HISTORY: Michael [name] 56 y.o. male returns to the clinic today for follow-up visit.  The patient was last seen in 2019 and he was lost to follow-up since that time.  He kept asking for refill of his medication during the COVID-19 pandemic and he was getting the refill but did not show up for the follow-up visits.  He is feeling fine today with no concerning complaints.  He denied having any chest pain, shortness of breath, cough or hemoptysis.  He has no nausea, vomiting, diarrhea or constipation.  He has no headache or visual changes.  He is here today for evaluation and repeat blood work before receiving the next refill of his medication.  MEDICAL HISTORY: Past Medical History:  Diagnosis Date   Chronic renal insufficiency, stage 2 (mild)    GFR around 60-65 (sCr 1.2-1.3 avg from 2010-2018). Slight inc in sCr to 1.35-1.4 avg 2019-2021.   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   Essential hypertension    started med 01/2020   History of pneumonia approx 2014   Hypertriglyceridemia    Increased prostate specific antigen (PSA) velocity 02/2021   Liver cyst    Left hepatic dome 2 cm cyst, CT [id number]   Metabolic syndrome    elev trigs, low HDL, HTN   Pruritus ani, intermittent 01/17/2013   Splenomegaly    resolved CT Sep2013-->this is how his CML was detected/diagnosed    ALLERGIES:  has No Known Allergies.  MEDICATIONS:  Current Outpatient Medications  Medication Sig Dispense Refill   amLODipine-benazepril (LOTREL) 5-20 MG capsule TAKE 1 CAPSULE BY MOUTH EVERY DAY 90 capsule 1   imatinib (GLEEVEC) 400 MG  tablet TAKE 1 TABLET BY MOUTH ONCE DAILY AT THE SAME TIME WITH FOOD AND A LARGE GLASS OF WATER. DO NOT CRUSH TABLETS. AVOID GRAPEFRUIT PRODUCTS. 30 tablet 0   No current facility-administered medications for this visit.    SURGICAL HISTORY:  Past Surgical History:  Procedure Laterality Date   COLONOSCOPY  01/09/2018   mild sigmoid diverticulosis, otherwise normal.  Recall 10 yrs.   REFRACTIVE SURGERY  2000   TOENAIL EXCISION  01/16/2013   ingrown toenail- not removed    TONSILECTOMY, ADENOIDECTOMY, BILATERAL MYRINGOTOMY AND TUBES     age 73   TREATMENT FISTULA ANAL  2015   Anal fistula surgically repaired.    REVIEW OF SYSTEMS:  A comprehensive review of systems was negative.   PHYSICAL EXAMINATION: General appearance: alert, cooperative and no distress Head: Normocephalic, without obvious abnormality, atraumatic Neck: no adenopathy, no JVD, supple, symmetrical, trachea midline and thyroid not enlarged, symmetric, no tenderness/mass/nodules Lymph nodes: Cervical, supraclavicular, and axillary nodes normal. Resp: clear to auscultation bilaterally Back: symmetric, no curvature. ROM normal. No CVA tenderness. Cardio: regular rate and rhythm, S1, S2 normal, no murmur, click, rub or gallop GI: soft, non-tender; bowel sounds normal; no masses,  no organomegaly Extremities: extremities normal, atraumatic, no cyanosis or edema  ECOG PERFORMANCE STATUS: 0 - Asymptomatic  Blood pressure 123/85, pulse 61, temperature (!) 97.4 F (36.3 C), temperature source Tympanic, resp. rate 16, weight 198 lb 4.8  oz (89.9 kg), SpO2 100 %.  LABORATORY DATA: Lab Results  Component Value Date   WBC 3.3 (L) 02/09/2022   HGB 13.1 02/09/2022   HCT 37.1 (L) 02/09/2022   MCV 93.9 02/09/2022   PLT 155 02/09/2022      Chemistry      Component Value Date/Time   NA 140 03/12/2021 0845   NA 141 08/23/2017 1545   K 4.1 03/12/2021 0845   K 3.8 08/23/2017 1545   CL 106 03/12/2021 0845   CL 108 (H)  01/19/2013 1206   CO2 28 03/12/2021 0845   CO2 26 08/23/2017 1545   BUN 16 03/12/2021 0845   BUN 20.2 08/23/2017 1545   CREATININE 1.34 03/12/2021 0845   CREATININE 1.36 (H) 01/31/2020 1002   CREATININE 1.3 08/23/2017 1545   GLU 97 01/17/2015 0000      Component Value Date/Time   CALCIUM 8.9 03/12/2021 0845   CALCIUM 9.3 08/23/2017 1545   ALKPHOS 73 03/12/2021 0845   ALKPHOS 100 08/23/2017 1545   AST 32 03/12/2021 0845   AST 43 (H) 08/30/2018 0818   AST 33 08/23/2017 1545   ALT 34 03/12/2021 0845   ALT 37 08/30/2018 0818   ALT 32 08/23/2017 1545   BILITOT 0.8 03/12/2021 0845   BILITOT 1.0 08/30/2018 0818   BILITOT 0.54 08/23/2017 1545       RADIOGRAPHIC STUDIES: No results found.  ASSESSMENT AND PLAN:  This is a very pleasant 56 years old white male with chronic myeloid leukemia diagnosed in February 2009. The patient has been on treatment with Gleevec 400 mg p.o. daily since that time and he is feeling fine. Repeat CBC today showed mild leukocytopenia but no other abnormalities. I recommended for the patient to continue his current treatment with [facility] but we will repeat blood work in 6 months for further evaluation of the leukocytopenia.  He will also have molecular study for BCR/ABL at that time. The patient was advised to call if he has any other concerning symptoms in the interval. The patient voices understanding of current disease status and treatment options and is in agreement with the current care plan. All questions were answered. The patient knows to call the clinic with any problems, questions or concerns. We can certainly see the patient much sooner if necessary.  Disclaimer: This note was dictated with voice recognition software. Similar sounding words can inadvertently be transcribed and may not be corrected upon review.

## 2022-02-10 ENCOUNTER — Other Ambulatory Visit: Payer: Self-pay | Admitting: Internal Medicine

## 2022-02-10 DIAGNOSIS — C9210 Chronic myeloid leukemia, BCR/ABL-positive, not having achieved remission: Secondary | ICD-10-CM

## 2022-02-21 ENCOUNTER — Other Ambulatory Visit: Payer: Self-pay | Admitting: Family Medicine

## 2022-02-23 ENCOUNTER — Other Ambulatory Visit: Payer: Self-pay | Admitting: Family Medicine

## 2022-02-24 ENCOUNTER — Telehealth: Payer: Self-pay | Admitting: Family Medicine

## 2022-02-24 ENCOUNTER — Other Ambulatory Visit: Payer: Self-pay

## 2022-02-24 MED ORDER — AMLODIPINE BESY-BENAZEPRIL HCL 5-20 MG PO CAPS: ORAL_CAPSULE | ORAL | 1 refills | Status: DC

## 2022-02-24 NOTE — Telephone Encounter (Signed)
Pt agreed to scheduling appt in Aug when he sees oncologist. He would like 90 d/s for insurance until then. Refill sent to pharmacy. Pt will call back to schedule CPE ?

## 2022-02-24 NOTE — Telephone Encounter (Signed)
Pt was given 30 d/s on 3/6 to local pharmacy. Pt is due for CPE appt this month. ?

## 2022-02-24 NOTE — Telephone Encounter (Signed)
Pt wondering can he get a med refill for medication below. ?Informed pt we haven't seen him since last year. ?Pt said his lab visit was September of last, informed pt that was a NURSE visit, I could schedule him for an Office Visit. Pt refused, pt wanted me to send a note back before he schedules an appt. ? ?amLODipine-benazepril ?amLODipine-benazepril (LOTREL) 5-20 MG capsule ? ?Pt cell: [phone] ? ?[facility] [place] [facility] [place] [facility] [address] [place] [address] Phone:  [phone]  ?Fax:  [phone]  ?  ? ? ? ? ?

## 2022-06-14 ENCOUNTER — Other Ambulatory Visit (HOSPITAL_COMMUNITY): Payer: Self-pay

## 2022-07-15 ENCOUNTER — Telehealth: Payer: Self-pay | Admitting: Family Medicine

## 2022-07-15 NOTE — Telephone Encounter (Signed)
Patient would like to know if it is possible for him to get blood work his oncology Dr. Alinda Dooms done here during his rountine lab work done with Dr. Anitra Lauth.He mentioned that he gets blood work done every 6 months with his cancer Dr, and he is now wanting to have that done here with Dr. Anitra Lauth, and states that he can provide the list of what his Oncology Dr wants done.

## 2022-07-15 NOTE — Telephone Encounter (Signed)
Please advise 

## 2022-07-16 NOTE — Telephone Encounter (Signed)
Sure, this is fine

## 2022-07-16 NOTE — Telephone Encounter (Addendum)
Pt advised ok for oncology labs to be done here. He will have Feb labs done during next f/u 8/23

## 2022-07-27 ENCOUNTER — Telehealth: Payer: Self-pay

## 2022-07-27 MED ORDER — SILDENAFIL CITRATE 50 MG PO TABS: ORAL_TABLET | ORAL | 1 refills | Status: DC

## 2022-07-27 NOTE — Telephone Encounter (Signed)
Viagra rx sent. I don't know what his insurer prefers so if they don't cover this then he needs to ask if they will cover cialis or levitra.

## 2022-07-27 NOTE — Telephone Encounter (Signed)
Patient wanting to know if he would be a candidate for ED medication. Patient has CPE scheduled on 8/23.   Please advise.  [phone]  [facility]

## 2022-07-27 NOTE — Telephone Encounter (Signed)
Pt was advised this could be discussed during o/v on 8/23. He was very adamant about not wanting to wait until appt if this was something that could be done sooner. He would like to know if medication could be prescribed.  Please review and advise

## 2022-07-27 NOTE — Telephone Encounter (Signed)
Pt advised rx sent and recommendations if rx not covered via Mychart message.

## 2022-07-27 NOTE — Telephone Encounter (Signed)
Sure. We can discuss at his o/v

## 2022-08-05 ENCOUNTER — Encounter: Payer: BC Managed Care – PPO | Admitting: Family Medicine

## 2022-08-09 ENCOUNTER — Other Ambulatory Visit: Payer: Self-pay | Admitting: Internal Medicine

## 2022-08-09 DIAGNOSIS — C9210 Chronic myeloid leukemia, BCR/ABL-positive, not having achieved remission: Secondary | ICD-10-CM

## 2022-08-10 ENCOUNTER — Other Ambulatory Visit: Payer: BC Managed Care – PPO

## 2022-08-10 ENCOUNTER — Ambulatory Visit: Payer: BC Managed Care – PPO | Admitting: Internal Medicine

## 2022-08-11 ENCOUNTER — Ambulatory Visit (INDEPENDENT_AMBULATORY_CARE_PROVIDER_SITE_OTHER): Payer: BC Managed Care – PPO | Admitting: Family Medicine

## 2022-08-11 ENCOUNTER — Encounter: Payer: Self-pay | Admitting: Family Medicine

## 2022-08-11 VITALS — BP 100/64 | HR 60 | Temp 97.7°F | Ht 71.0 in | Wt 184.8 lb

## 2022-08-11 DIAGNOSIS — E781 Pure hyperglyceridemia: Secondary | ICD-10-CM

## 2022-08-11 DIAGNOSIS — Z125 Encounter for screening for malignant neoplasm of prostate: Secondary | ICD-10-CM | POA: Diagnosis not present

## 2022-08-11 DIAGNOSIS — N529 Male erectile dysfunction, unspecified: Secondary | ICD-10-CM

## 2022-08-11 DIAGNOSIS — Z0000 Encounter for general adult medical examination without abnormal findings: Secondary | ICD-10-CM | POA: Diagnosis not present

## 2022-08-11 DIAGNOSIS — N182 Chronic kidney disease, stage 2 (mild): Secondary | ICD-10-CM | POA: Diagnosis not present

## 2022-08-11 DIAGNOSIS — E786 Lipoprotein deficiency: Secondary | ICD-10-CM | POA: Diagnosis not present

## 2022-08-11 DIAGNOSIS — R031 Nonspecific low blood-pressure reading: Secondary | ICD-10-CM

## 2022-08-11 DIAGNOSIS — C9210 Chronic myeloid leukemia, BCR/ABL-positive, not having achieved remission: Secondary | ICD-10-CM

## 2022-08-11 DIAGNOSIS — I10 Essential (primary) hypertension: Secondary | ICD-10-CM

## 2022-08-11 LAB — CBC WITH DIFFERENTIAL/PLATELET
Basophils Absolute: 0 10*3/uL (ref 0.0–0.1)
Basophils Relative: 0.7 % (ref 0.0–3.0)
Eosinophils Absolute: 0 10*3/uL (ref 0.0–0.7)
Eosinophils Relative: 0.4 % (ref 0.0–5.0)
HCT: 39 % (ref 39.0–52.0)
Hemoglobin: 13.3 g/dL (ref 13.0–17.0)
Lymphocytes Relative: 24.7 % (ref 12.0–46.0)
Lymphs Abs: 1.3 10*3/uL (ref 0.7–4.0)
MCHC: 34.2 g/dL (ref 30.0–36.0)
MCV: 97.2 fl (ref 78.0–100.0)
Monocytes Absolute: 0.3 10*3/uL (ref 0.1–1.0)
Monocytes Relative: 5.1 % (ref 3.0–12.0)
Neutro Abs: 3.7 10*3/uL (ref 1.4–7.7)
Neutrophils Relative %: 69.1 % (ref 43.0–77.0)
Platelets: 171 10*3/uL (ref 150.0–400.0)
RBC: 4.01 Mil/uL — ABNORMAL LOW (ref 4.22–5.81)
RDW: 13.4 % (ref 11.5–15.5)
WBC: 5.4 10*3/uL (ref 4.0–10.5)

## 2022-08-11 LAB — LDL CHOLESTEROL, DIRECT: Direct LDL: 101 mg/dL

## 2022-08-11 LAB — PSA: PSA: 3.06 ng/mL (ref 0.10–4.00)

## 2022-08-11 LAB — LIPID PANEL
Cholesterol: 195 mg/dL (ref 0–200)
HDL: 38.8 mg/dL — ABNORMAL LOW (ref >39.00)
NonHDL: 155.82
Total CHOL/HDL Ratio: 5
Triglycerides: 239 mg/dL — ABNORMAL HIGH (ref 0.0–149.0)
VLDL: 47.8 mg/dL — ABNORMAL HIGH (ref 0.0–40.0)

## 2022-08-11 LAB — COMPREHENSIVE METABOLIC PANEL
ALT: 23 U/L (ref 0–53)
AST: 26 U/L (ref 0–37)
Albumin: 4.6 g/dL (ref 3.5–5.2)
Alkaline Phosphatase: 60 U/L (ref 39–117)
BUN: 17 mg/dL (ref 6–23)
CO2: 22 mEq/L (ref 19–32)
Calcium: 9.3 mg/dL (ref 8.4–10.5)
Chloride: 106 mEq/L (ref 96–112)
Creatinine, Ser: 1.53 mg/dL — ABNORMAL HIGH (ref 0.40–1.50)
GFR: 50.21 mL/min — ABNORMAL LOW (ref >60.00)
Glucose, Bld: 91 mg/dL (ref 70–99)
Potassium: 4 mEq/L (ref 3.5–5.1)
Sodium: 138 mEq/L (ref 135–145)
Total Bilirubin: 0.8 mg/dL (ref 0.2–1.2)
Total Protein: 6.7 g/dL (ref 6.0–8.3)

## 2022-08-11 LAB — HEMOGLOBIN A1C: Hgb A1c MFr Bld: 5.7 % (ref 4.6–6.5)

## 2022-08-11 LAB — TSH: TSH: 1.66 u[IU]/mL (ref 0.35–5.50)

## 2022-08-11 MED ORDER — AMLODIPINE BESY-BENAZEPRIL HCL 5-20 MG PO CAPS: ORAL_CAPSULE | ORAL | 3 refills | Status: DC

## 2022-08-11 MED ORDER — TRAMADOL HCL 50 MG PO TABS: ORAL_TABLET | ORAL | 0 refills | Status: DC

## 2022-08-11 NOTE — Patient Instructions (Signed)
Health Maintenance, Male Adopting a healthy lifestyle and getting preventive care are important in promoting health and wellness. Ask your health care provider about: The right schedule for you to have regular tests and exams. Things you can do on your own to prevent diseases and keep yourself healthy. What should I know about diet, weight, and exercise? Eat a healthy diet  Eat a diet that includes plenty of vegetables, fruits, low-fat dairy products, and lean protein. Do not eat a lot of foods that are high in solid fats, added sugars, or sodium. Maintain a healthy weight Body mass index (BMI) is a measurement that can be used to identify possible weight problems. It estimates body fat based on height and weight. Your health care provider can help determine your BMI and help you achieve or maintain a healthy weight. Get regular exercise Get regular exercise. This is one of the most important things you can do for your health. Most adults should: Exercise for at least 150 minutes each week. The exercise should increase your heart rate and make you sweat (moderate-intensity exercise). Do strengthening exercises at least twice a week. This is in addition to the moderate-intensity exercise. Spend less time sitting. Even light physical activity can be beneficial. Watch cholesterol and blood lipids Have your blood tested for lipids and cholesterol at 57 years of age, then have this test every 5 years. You may need to have your cholesterol levels checked more often if: Your lipid or cholesterol levels are high. You are older than 57 years of age. You are at high risk for heart disease. What should I know about cancer screening? Many types of cancers can be detected early and may often be prevented. Depending on your health history and family history, you may need to have cancer screening at various ages. This may include screening for: Colorectal cancer. Prostate cancer. Skin cancer. Lung  cancer. What should I know about heart disease, diabetes, and high blood pressure? Blood pressure and heart disease High blood pressure causes heart disease and increases the risk of stroke. This is more likely to develop in people who have high blood pressure readings or are overweight. Talk with your health care provider about your target blood pressure readings. Have your blood pressure checked: Every 3-5 years if you are 18-39 years of age. Every year if you are 40 years old or older. If you are between the ages of 65 and 75 and are a current or former smoker, ask your health care provider if you should have a one-time screening for abdominal aortic aneurysm (AAA). Diabetes Have regular diabetes screenings. This checks your fasting blood sugar level. Have the screening done: Once every three years after age 45 if you are at a normal weight and have a low risk for diabetes. More often and at a younger age if you are overweight or have a high risk for diabetes. What should I know about preventing infection? Hepatitis B If you have a higher risk for hepatitis B, you should be screened for this virus. Talk with your health care provider to find out if you are at risk for hepatitis B infection. Hepatitis C Blood testing is recommended for: Everyone born from 1945 through 1965. Anyone with known risk factors for hepatitis C. Sexually transmitted infections (STIs) You should be screened each year for STIs, including gonorrhea and chlamydia, if: You are sexually active and are younger than 57 years of age. You are older than 57 years of age and your   health care provider tells you that you are at risk for this type of infection. Your sexual activity has changed since you were last screened, and you are at increased risk for chlamydia or gonorrhea. Ask your health care provider if you are at risk. Ask your health care provider about whether you are at high risk for HIV. Your health care provider  may recommend a prescription medicine to help prevent HIV infection. If you choose to take medicine to prevent HIV, you should first get tested for HIV. You should then be tested every 3 months for as long as you are taking the medicine. Follow these instructions at home: Alcohol use Do not drink alcohol if your health care provider tells you not to drink. If you drink alcohol: Limit how much you have to 0-2 drinks a day. Know how much alcohol is in your drink. In the [place] one drink equals one 12 oz bottle of beer (355 mL), one 5 oz glass of wine (148 mL), or one 1 oz glass of hard liquor (44 mL). Lifestyle Do not use any products that contain nicotine or tobacco. These products include cigarettes, chewing tobacco, and vaping devices, such as e-cigarettes. If you need help quitting, ask your health care provider. Do not use street drugs. Do not share needles. Ask your health care provider for help if you need support or information about quitting drugs. General instructions Schedule regular health, dental, and eye exams. Stay current with your vaccines. Tell your health care provider if: You often feel depressed. You have ever been abused or do not feel safe at home. Summary Adopting a healthy lifestyle and getting preventive care are important in promoting health and wellness. Follow your health care provider's instructions about healthy diet, exercising, and getting tested or screened for diseases. Follow your health care provider's instructions on monitoring your cholesterol and blood pressure. This information is not intended to replace advice given to you by your health care provider. Make sure you discuss any questions you have with your health care provider. Document Revised: 04/27/2021 Document Reviewed: 04/27/2021 Elsevier Patient Education  2023 Elsevier Inc.  

## 2022-08-11 NOTE — Progress Notes (Addendum)
Office Note 08/11/2022  CC:  Chief Complaint  Patient presents with   Annual Exam   HPI:  Patient is a 56 y.o. male who is here for annual health maintenance exam and f/u HTN.  He says he feels very well physically. He is going through some struggles emotionally, is going through a divorce right now. He is keeping very physically fit and working through it though. Getting back into dating some.  Has had some issues with erectile dysfunction as well as premature ejaculation that he wanted to discuss today.  Past Medical History:  Diagnosis Date   Chronic renal insufficiency, stage 2 (mild)    GFR around 60-65 (sCr 1.2-1.3 avg from 2010-2018). Slight inc in sCr to 1.35-1.4 avg 2019-2021.   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   Essential hypertension    started med 01/2020   History of pneumonia approx 2014   Hypertriglyceridemia    Increased prostate specific antigen (PSA) velocity 02/2021   Liver cyst    Left hepatic dome 2 cm cyst, CT [id number]   Metabolic syndrome    elev trigs, low HDL, HTN   Pruritus ani, intermittent 01/17/2013   Splenomegaly    resolved CT Sep2013-->this is how his CML was detected/diagnosed    Past Surgical History:  Procedure Laterality Date   COLONOSCOPY  01/09/2018   mild sigmoid diverticulosis, otherwise normal.  Recall 10 yrs.   REFRACTIVE SURGERY  2000   TOENAIL EXCISION  01/16/2013   ingrown toenail- not removed    TONSILECTOMY, ADENOIDECTOMY, BILATERAL MYRINGOTOMY AND TUBES     age 42   TREATMENT FISTULA ANAL  2015   Anal fistula surgically repaired.    Family History  Problem Relation Age of Onset   Heart disease Mother        died from a  reaction to heparin    Cancer Father 101       prostate   Prostate cancer Father    Heart disease Father    Pulmonary fibrosis Father    Colon cancer Neg Hx    Colon polyps Neg Hx     Social History   Socioeconomic History   Marital status: Married     Spouse name: Not on file   Number of children: Not on file   Years of education: Not on file   Highest education level: Not on file  Occupational History   Not on file  Tobacco Use   Smoking status: Never   Smokeless tobacco: Never  Vaping Use   Vaping Use: Never used  Substance and Sexual Activity   Alcohol use: Yes    Comment: occasional    Drug use: No   Sexual activity: Not on file  Other Topics Concern   Not on file  Social History Narrative   Married, 1 son and 1 daughter.   Orig from [place].   Grad from [employer].   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   No T/A/Ds.   Social Determinants of Health   Financial Resource Strain: Not on file  Food Insecurity: Not on file  Transportation Needs: Not on file  Physical Activity: Not on file  Stress: Not on file  Social Connections: Not on file  Intimate Partner Violence: Not on file    Outpatient Medications Prior to Visit  Medication Sig Dispense Refill   CREATINE MONOHYDRATE PO Take 3,000 mg by mouth every morning.  imatinib (GLEEVEC) 400 MG tablet TAKE WITH MEALS AND LARGE GLASS OF WATER.CAUTION:CHEMOTHERAPY. 30 tablet 5   sildenafil (VIAGRA) 50 MG tablet 1-2 tabs po qd prn 10 tablet 1   amLODipine-benazepril (LOTREL) 5-20 MG capsule TAKE 1 CAPSULE BY MOUTH EVERY DAY 90 capsule 1   No facility-administered medications prior to visit.    No Known Allergies  ROS Review of Systems  Constitutional:  Negative for appetite change, chills, fatigue and fever.  HENT:  Negative for congestion, dental problem, ear pain and sore throat.   Eyes:  Negative for discharge, redness and visual disturbance.  Respiratory:  Negative for cough, chest tightness, shortness of breath and wheezing.   Cardiovascular:  Negative for chest pain, palpitations and leg swelling.  Gastrointestinal:  Positive for abdominal pain (having some abd wall cramping intermittently on left mid/upper abd).  Negative for blood in stool, diarrhea, nausea and vomiting.  Genitourinary:  Negative for difficulty urinating, dysuria, flank pain, frequency, hematuria and urgency.  Musculoskeletal:  Negative for arthralgias, back pain, joint swelling, myalgias and neck stiffness.  Skin:  Negative for pallor and rash.  Neurological:  Negative for dizziness, speech difficulty, weakness and headaches.  Hematological:  Negative for adenopathy. Does not bruise/bleed easily.  Psychiatric/Behavioral:  Negative for confusion and sleep disturbance. The patient is not nervous/anxious.     PE;    08/11/2022    2:45 PM 08/11/2022    2:44 PM 02/09/2022    8:08 AM  Vitals with BMI  Height  '5\' 11"'$    Weight  184 lbs 13 oz 198 lbs 5 oz  BMI  19.37   Systolic 902 409 735  Diastolic 64 60 85  Pulse  60 61   Gen: Alert, well appearing.  Patient is oriented to person, place, time, and situation. AFFECT: pleasant, lucid thought and speech. ENT: Ears: EACs clear, normal epithelium.  TMs with good light reflex and landmarks bilaterally.  Eyes: no injection, icteris, swelling, or exudate.  EOMI, PERRLA. Nose: no drainage or turbinate edema/swelling.  No injection or focal lesion.  Mouth: lips without lesion/swelling.  Oral mucosa pink and moist.  Dentition intact and without obvious caries or gingival swelling.  Oropharynx without erythema, exudate, or swelling.  Neck: supple/nontender.  No LAD, mass, or TM.  Carotid pulses 2+ bilaterally, without bruits. CV: RRR, no m/r/g.   LUNGS: CTA bilat, nonlabored resps, good aeration in all lung fields. ABD: soft, NT, ND, BS normal.  No hepatospenomegaly or mass.  No bruits. He had a painful abdominal wall spasm while I examined him today. EXT: no clubbing, cyanosis, or edema.  Musculoskeletal: no joint swelling, erythema, warmth, or tenderness.  ROM of all joints intact. Skin - no sores or suspicious lesions or rashes or color changes   Pertinent labs:   Lab Results   Component Value Date   TSH 2.33 03/12/2021   Lab Results  Component Value Date   WBC 3.3 (L) 02/09/2022   HGB 13.1 02/09/2022   HCT 37.1 (L) 02/09/2022   MCV 93.9 02/09/2022   PLT 155 02/09/2022   Lab Results  Component Value Date   CREATININE 1.61 (H) 02/09/2022   BUN 16 02/09/2022   NA 140 02/09/2022   K 4.1 02/09/2022   CL 108 02/09/2022   CO2 27 02/09/2022   Lab Results  Component Value Date   ALT 31 02/09/2022   AST 36 02/09/2022   ALKPHOS 69 02/09/2022   BILITOT 0.9 02/09/2022   Lab Results  Component Value Date  CHOL 181 03/12/2021   Lab Results  Component Value Date   HDL 34.50 (L) 03/12/2021   Lab Results  Component Value Date   LDLCALC 93 01/25/2020   Lab Results  Component Value Date   TRIG 230.0 (H) 03/12/2021   Lab Results  Component Value Date   CHOLHDL 5 03/12/2021   Lab Results  Component Value Date   PSA 3.03 09/17/2021   PSA 3.65 03/12/2021   PSA 2.8 01/25/2020   Lab Results  Component Value Date   HGBA1C 5.5 03/13/2021   ASSESSMENT AND PLAN:   #1 essential hypertension. His blood pressure is actually in the low normal range today.  He will start home blood pressure monitoring and if it remains like it is today he will stop his blood pressure pill and monitor closely.  2 erectile dysfunction. He wants to try Viagra, which I prescribed him and he has on hand. He has history of premature ejaculation that causes him considerable angst. He asks what he could possibly try.  We discussed trial of tramadol, although this is off label use. Discussed the possible drawbacks of this type of medication, including habit-forming, impairment, constipation. We went ahead and decided to move forward with this, taking it in small amounts and being careful to not mix with alcohol. 50 mg tramadol, #30 prescribed today.  #3 CML.  He has had long-term remission.   His oncologist asked that we check a couple of their labs and forward them to their  office. BCR/abl-PCR and LDH ordered.  #4 Health maintenance exam: Reviewed age and gender appropriate health maintenance issues (prudent diet, regular exercise, health risks of tobacco and excessive alcohol, use of seatbelts, fire alarms in home, use of sunscreen).  Also reviewed age and gender appropriate health screening as well as vaccine recommendations. Vaccines:  ALL UTD. Labs: fasting HP ordered.  Hba1c for DM screening. Prostate ca screening: PSA ordered. Colon ca screening: recall 2029.  An After Visit Summary was printed and given to the patient.  FOLLOW UP:  Return in about 6 weeks (around 09/22/2022) for f/u bp and other.  Signed:  [name], MD           08/11/2022

## 2022-08-16 LAB — BCR/ABL GENE REARRANGEMENT QNT, PCR
BCR-ABL1/ABL1 % (IS): 0 %
BCR-ABL1/ABL1 %: 0 %

## 2022-08-16 LAB — RFLX P190 BCR-ABL1: P190 BCR ABL1: NOT DETECTED

## 2022-08-16 LAB — RFLX P210 BCR-ABL1: P210 BCR ABL1: NOT DETECTED

## 2022-08-30 ENCOUNTER — Other Ambulatory Visit: Payer: Self-pay | Admitting: Internal Medicine

## 2022-08-30 DIAGNOSIS — C9210 Chronic myeloid leukemia, BCR/ABL-positive, not having achieved remission: Secondary | ICD-10-CM

## 2022-09-01 ENCOUNTER — Other Ambulatory Visit: Payer: Self-pay

## 2022-09-01 ENCOUNTER — Telehealth: Payer: Self-pay

## 2022-09-01 ENCOUNTER — Inpatient Hospital Stay: Payer: BC Managed Care – PPO | Attending: Internal Medicine | Admitting: Internal Medicine

## 2022-09-01 VITALS — BP 136/86 | HR 63 | Temp 98.2°F | Resp 16 | Wt 190.4 lb

## 2022-09-01 DIAGNOSIS — C9210 Chronic myeloid leukemia, BCR/ABL-positive, not having achieved remission: Secondary | ICD-10-CM

## 2022-09-01 NOTE — Progress Notes (Signed)
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  PRIOR THERAPY: None  CURRENT THERAPY: Gleevec 400 mg by mouth daily started February 2009  INTERVAL HISTORY: Michael [name] 56 y.o. male returns to the clinic today for follow-up visit.  The patient is feeling fine today with no concerning complaints.  He has occasional pain on the left upper quadrant when he bends over or stretch.  He has a history of splenomegaly in the past at the time of his initial diagnosis.  He denied having any current chest pain, shortness of breath, cough or hemoptysis.  He denied having any fever or chills.  He has no nausea, vomiting, diarrhea or constipation.  He has no headache or visual changes.  He continues to tolerate his treatment with Gleevec fairly well.  He has recent blood work performed including molecular studies for BCR/ABL that showed no detectable copies.  The patient is here today for evaluation and discussion of his lab results as well as refill of his medication.  MEDICAL HISTORY: Past Medical History:  Diagnosis Date   Chronic renal insufficiency, stage 2 (mild)    GFR around 60-65 (sCr 1.2-1.3 avg from 2010-2018). Slight inc in sCr to 1.35-1.4 avg 2019-2021.   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   Essential hypertension    started med 01/2020   History of pneumonia approx 2014   Hypertriglyceridemia    Increased prostate specific antigen (PSA) velocity 02/2021   Liver cyst    Left hepatic dome 2 cm cyst, CT [id number]   Metabolic syndrome    elev trigs, low HDL, HTN   Pruritus ani, intermittent 01/17/2013   Splenomegaly    resolved CT Sep2013-->this is how his CML was detected/diagnosed    ALLERGIES:  has No Known Allergies.  MEDICATIONS:  Current Outpatient  Medications  Medication Sig Dispense Refill   amLODipine-benazepril (LOTREL) 5-20 MG capsule TAKE 1 CAPSULE BY MOUTH EVERY DAY 90 capsule 3   CREATINE MONOHYDRATE PO Take 3,000 mg by mouth every morning.     imatinib (GLEEVEC) 400 MG tablet TAKE WITH MEALS AND LARGE GLASS OF WATER.CAUTION:CHEMOTHERAPY. 30 tablet 5   sildenafil (VIAGRA) 50 MG tablet 1-2 tabs po qd prn 10 tablet 1   traMADol (ULTRAM) 50 MG tablet 1-2 tabs po bid prn pain 30 tablet 0   No current facility-administered medications for this visit.    SURGICAL HISTORY:  Past Surgical History:  Procedure Laterality Date   COLONOSCOPY  01/09/2018   mild sigmoid diverticulosis, otherwise normal.  Recall 10 yrs.   REFRACTIVE SURGERY  2000   TOENAIL EXCISION  01/16/2013   ingrown toenail- not removed    TONSILECTOMY, ADENOIDECTOMY, BILATERAL MYRINGOTOMY AND TUBES     age 39   TREATMENT FISTULA ANAL  2015   Anal fistula surgically repaired.    REVIEW OF SYSTEMS:  A comprehensive review of systems was negative.   PHYSICAL EXAMINATION: General appearance: alert, cooperative and no distress Head: Normocephalic, without obvious abnormality, atraumatic Neck: no adenopathy, no JVD, supple, symmetrical, trachea midline and thyroid not enlarged, symmetric, no tenderness/mass/nodules Lymph nodes: Cervical, supraclavicular, and axillary nodes normal. Resp: clear to auscultation bilaterally Back: symmetric, no curvature. ROM normal. No CVA tenderness. Cardio: regular rate and rhythm, S1, S2  normal, no murmur, click, rub or gallop GI: soft, non-tender; bowel sounds normal; no masses,  no organomegaly Extremities: extremities normal, atraumatic, no cyanosis or edema  ECOG PERFORMANCE STATUS: 0 - Asymptomatic  Blood pressure 136/86, pulse 63, temperature 98.2 F (36.8 C), temperature source Oral, resp. rate 16, weight 190 lb 6 oz (86.4 kg), SpO2 99 %.  LABORATORY DATA: Lab Results  Component Value Date   WBC 5.4 08/11/2022   HGB  13.3 08/11/2022   HCT 39.0 08/11/2022   MCV 97.2 08/11/2022   PLT 171.0 08/11/2022      Chemistry      Component Value Date/Time   NA 138 08/11/2022 1521   NA 141 08/23/2017 1545   K 4.0 08/11/2022 1521   K 3.8 08/23/2017 1545   CL 106 08/11/2022 1521   CL 108 (H) 01/19/2013 1206   CO2 22 08/11/2022 1521   CO2 26 08/23/2017 1545   BUN 17 08/11/2022 1521   BUN 20.2 08/23/2017 1545   CREATININE 1.53 (H) 08/11/2022 1521   CREATININE 1.61 (H) 02/09/2022 0757   CREATININE 1.36 (H) 01/31/2020 1002   CREATININE 1.3 08/23/2017 1545   GLU 97 01/17/2015 0000      Component Value Date/Time   CALCIUM 9.3 08/11/2022 1521   CALCIUM 9.3 08/23/2017 1545   ALKPHOS 60 08/11/2022 1521   ALKPHOS 100 08/23/2017 1545   AST 26 08/11/2022 1521   AST 36 02/09/2022 0757   AST 33 08/23/2017 1545   ALT 23 08/11/2022 1521   ALT 31 02/09/2022 0757   ALT 32 08/23/2017 1545   BILITOT 0.8 08/11/2022 1521   BILITOT 0.9 02/09/2022 0757   BILITOT 0.54 08/23/2017 1545       RADIOGRAPHIC STUDIES: No results found.  ASSESSMENT AND PLAN:  This is a very pleasant 56 years old white male with chronic myeloid leukemia diagnosed in February 2009. The patient has been on treatment with Gleevec 400 mg p.o. daily since that time and he is feeling fine. He has been on this treatment for almost 14 years now with no concerning complaints. His most recent blood work including the molecular studies for BCR/ABL showed no detectable copies. I recommended for him to continue his current treatment with imatinib with the same dose. I will see him back for virtual video visit in 6 months for evaluation and discussion of his lab results at that time. The patient was advised to call immediately if he has any other concerning symptoms in the interval. The patient voices understanding of current disease status and treatment options and is in agreement with the current care plan. All questions were answered. The patient  knows to call the clinic with any problems, questions or concerns. We can certainly see the patient much sooner if necessary.  Disclaimer: This note was dictated with voice recognition software. Similar sounding words can inadvertently be transcribed and may not be corrected upon review.

## 2022-09-01 NOTE — Telephone Encounter (Signed)
Pt had an appt scheduled for 08/10/22 but cancelled it because he was going to see his PCP and have labs drawn there. Pt felt he did not need to see two providers and chose to have his labs drawn at his PCP office.  Pt is requesting a refill of Gleevec.

## 2022-09-01 NOTE — Telephone Encounter (Signed)
I have spoken with the pt and advised as indicated. Pt is not happy with needing to be seen but was agreeable to be seen today at 3:45pm (there was a cancellation). Pt understands to arrive at 3:30p for registration.

## 2022-09-03 ENCOUNTER — Other Ambulatory Visit: Payer: Self-pay | Admitting: Internal Medicine

## 2022-09-03 DIAGNOSIS — C9210 Chronic myeloid leukemia, BCR/ABL-positive, not having achieved remission: Secondary | ICD-10-CM

## 2022-09-03 MED ORDER — IMATINIB MESYLATE 400 MG PO TABS: ORAL_TABLET | ORAL | 5 refills | Status: DC

## 2022-09-03 NOTE — Addendum Note (Signed)
Addended by: Claretha Cooper on: 09/03/2022 11:53 AM   Modules accepted: Orders

## 2022-09-03 NOTE — Telephone Encounter (Signed)
Pt called advising his pharmacy has not received a refill order for rx Gleevec.  Pt was seen 09/01/22. Dr. Worthy Flank note indicates it is ok for pt to continue this medication at the same dose. I have sent the rx as requested.

## 2022-09-06 ENCOUNTER — Telehealth: Payer: Self-pay

## 2022-09-06 ENCOUNTER — Other Ambulatory Visit: Payer: Self-pay | Admitting: Internal Medicine

## 2022-09-06 ENCOUNTER — Other Ambulatory Visit: Payer: Self-pay

## 2022-09-06 ENCOUNTER — Telehealth: Payer: Self-pay | Admitting: Medical Oncology

## 2022-09-06 DIAGNOSIS — C9210 Chronic myeloid leukemia, BCR/ABL-positive, not having achieved remission: Secondary | ICD-10-CM

## 2022-09-06 MED ORDER — IMATINIB MESYLATE 400 MG PO TABS: 400.0000 mg | ORAL_TABLET | Freq: Every day | ORAL | 5 refills | Status: DC

## 2022-09-06 NOTE — Telephone Encounter (Signed)
Call received from [facility] with [place] advising the rx for Gleevec does not have instructions as to the dose and frequency of the medication.   I have advised I will re-send the rx with this information.

## 2022-09-06 NOTE — Telephone Encounter (Signed)
Medication clarification imatinib-dose instructions given.

## 2022-09-07 ENCOUNTER — Ambulatory Visit (HOSPITAL_COMMUNITY)
Admission: EM | Admit: 2022-09-07 | Discharge: 2022-09-07 | Disposition: A | Discharge status: Home / Self Care | Payer: BC Managed Care – PPO | Attending: Family Medicine | Admitting: Family Medicine

## 2022-09-07 ENCOUNTER — Encounter (HOSPITAL_COMMUNITY): Payer: Self-pay

## 2022-09-07 DIAGNOSIS — L259 Unspecified contact dermatitis, unspecified cause: Secondary | ICD-10-CM

## 2022-09-07 DIAGNOSIS — R21 Rash and other nonspecific skin eruption: Secondary | ICD-10-CM

## 2022-09-07 MED ORDER — TRIAMCINOLONE ACETONIDE 0.5 % EX OINT: 1.0000 | TOPICAL_OINTMENT | Freq: Two times a day (BID) | CUTANEOUS | 0 refills | Status: DC

## 2022-09-07 MED ORDER — PREDNISONE 50 MG PO TABS: 50.0000 mg | ORAL_TABLET | Freq: Every day | ORAL | 0 refills | Status: AC

## 2022-09-07 NOTE — Discharge Instructions (Signed)
You were seen today for a rash.  This appears to be a contact dermatitis of unknown etiology.  I have sent out a topical cream, as well as an oral steroid to help with the itch.  I recommend you launder your clothes and bedding in hot/warm water separately.  Please return if not improving.

## 2022-09-07 NOTE — ED Triage Notes (Signed)
Pt has a rash on both right and left thighs  , red spreading and itchy x 1day. Pt states he was scratched by a stray cat yesterday

## 2022-09-07 NOTE — ED Provider Notes (Signed)
[place]    CSN: [id number] Arrival date & time: 09/07/22  1452      History   Chief Complaint Chief Complaint  Patient presents with   Rash    HPI Michael [name] is a 56 y.o. male.   Yesterday afternoon he noted small pimple like lesions on the thighs in patches.   Super itchy.  Feels like it is maybe itching, but just isolated from mid-thigh to hips.  Did not eat/drink anything new..   No new topicals.   Has not been in a hot tub recently.  Has not used anything for it.   Past Medical History:  Diagnosis Date   Chronic renal insufficiency, stage 2 (mild)    GFR around 60-65 (sCr 1.2-1.3 avg from 2010-2018). Slight inc in sCr to 1.35-1.4 avg 2019-2021.   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   Essential hypertension    started med 01/2020   History of pneumonia approx 2014   Hypertriglyceridemia    Increased prostate specific antigen (PSA) velocity 02/2021   Liver cyst    Left hepatic dome 2 cm cyst, CT [id number]   Metabolic syndrome    elev trigs, low HDL, HTN   Pruritus ani, intermittent 01/17/2013   Splenomegaly    resolved CT Sep2013-->this is how his CML was detected/diagnosed    Patient Active Problem List   Diagnosis Date Noted   Encounter for therapeutic drug monitoring 02/09/2022   Achilles tendinosis 05/22/2017   Lumbar radiculopathy 10/31/2013   Pruritus ani, intermittent 01/17/2013   Perirectal fistula intersphincteric s/p LIFT  10/09/2012   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  Recall 10 yrs.   REFRACTIVE SURGERY  2000   TOENAIL EXCISION  01/16/2013   ingrown toenail- not removed    TONSILECTOMY, ADENOIDECTOMY, BILATERAL MYRINGOTOMY AND TUBES     age 7   TREATMENT FISTULA ANAL  2015   Anal fistula surgically repaired.       Home  Medications    Prior to Admission medications   Medication Sig Start Date End Date Taking? Authorizing Provider  amLODipine-benazepril (LOTREL) 5-20 MG capsule TAKE 1 CAPSULE BY MOUTH EVERY DAY Patient not taking: Reported on 09/01/2022 08/11/22   Tammi Sou, MD  CREATINE MONOHYDRATE PO Take 3,000 mg by mouth every morning.    [provider]  imatinib (GLEEVEC) 400 MG tablet Take 1 tablet (400 mg total) by mouth daily. Take with meals and large glass of water.Caution:Chemotherapy. 09/06/22   Curt Bears, MD  sildenafil (VIAGRA) 50 MG tablet 1-2 tabs po qd prn 07/27/22   McGowen, Adrian Blackwater, MD  traMADol Veatrice Bourbon) 50 MG tablet 1-2 tabs po bid prn pain 08/11/22   McGowen, Adrian Blackwater, MD    Family History Family History  Problem Relation Age of Onset   Heart disease Mother        died from a  reaction to heparin    Cancer Father 78       prostate   Prostate cancer Father    Heart disease Father    Pulmonary fibrosis Father    Colon cancer Neg Hx    Colon polyps Neg Hx     Social History Social History   Tobacco Use   Smoking status: Never   Smokeless tobacco: Never  Vaping Use   Vaping Use: Never used  Substance Use Topics   Alcohol use: Yes    Comment: occasional    Drug use: No     Allergies   Patient has no known allergies.   Review of Systems Review of Systems  Constitutional: Negative.   HENT: Negative.    Respiratory: Negative.    Cardiovascular: Negative.   Gastrointestinal: Negative.   Genitourinary: Negative.   Musculoskeletal: Negative.   Skin:  Positive for rash.     Physical Exam Triage Vital Signs ED Triage Vitals  Enc Vitals Group     BP 09/07/22 1511 (!) 138/97     Pulse Rate 09/07/22 1511 65     Resp 09/07/22 1511 12     Temp 09/07/22 1511 97.9 F (36.6 C)     Temp Source 09/07/22 1511 Oral     SpO2 09/07/22 1511 99 %     Weight 09/07/22 1508 185 lb (83.9 kg)     Height 09/07/22 1508 '5\' 11"'$  (1.803 m)     Head Circumference  --      Peak Flow --      Pain Score --      Pain Loc --      Pain Edu? --      Excl. in [place]? --    No data found.  Updated Vital Signs BP (!) 138/97 (BP Location: Left Arm)   Pulse 65   Temp 97.9 F (36.6 C) (Oral)   Resp 12   Ht '5\' 11"'$  (1.803 m)   Wt 83.9 kg   SpO2 99%   BMI 25.80 kg/m   Visual Acuity Right Eye Distance:   Left Eye Distance:   Bilateral Distance:    Right Eye Near:   Left Eye Near:    Bilateral Near:     Physical Exam Constitutional:      Appearance: Normal appearance.  Skin:    Comments: At the upper thighs to the hips bilaterally is a patchy, raised rash;  slightly follicular, other area papular.  Neurological:     General: No focal deficit present.     Mental Status: He is alert.  Psychiatric:        Mood and Affect: Mood normal.      UC Treatments / Results  Labs (all labs ordered are listed, but only abnormal results are displayed) Labs Reviewed - No data to display  EKG   Radiology No results found.  Procedures Procedures (including critical care time)  Medications Ordered in UC Medications - No data to display  Initial Impression / Assessment and Plan / UC Course  I have reviewed the triage vital signs and the nursing notes.  Pertinent labs & imaging results that were available during my care of the patient were reviewed by me and considered in my medical decision making (see chart for details).    Final Clinical Impressions(s) / UC Diagnoses   Final diagnoses:  Rash  Contact dermatitis, unspecified contact dermatitis type, unspecified trigger     Discharge Instructions      You were seen today for a rash.  This appears to be a contact dermatitis of unknown etiology.  I have sent out a topical cream, as well as an oral steroid to help with the itch.  I recommend you launder your clothes and bedding in hot/warm water separately.  Please return if not improving.     ED Prescriptions     Medication Sig  Dispense Auth. Provider  triamcinolone ointment (KENALOG) 0.5 % Apply 1 Application topically 2 (two) times daily. 30 g Virda Betters, MD   predniSONE (DELTASONE) 50 MG tablet Take 1 tablet (50 mg total) by mouth daily for 5 days. 5 tablet Rondel Oh, MD      PDMP not reviewed this encounter.   Rondel Oh, MD 09/07/22 1525

## 2022-09-09 ENCOUNTER — Telehealth: Payer: Self-pay | Admitting: Family Medicine

## 2022-09-09 MED ORDER — BENAZEPRIL HCL 10 MG PO TABS: 10.0000 mg | ORAL_TABLET | Freq: Every day | ORAL | 0 refills | Status: DC

## 2022-09-09 NOTE — Telephone Encounter (Signed)
Pt was last seen 8/23.  Please further advise.

## 2022-09-09 NOTE — Telephone Encounter (Signed)
I would like him to restart blood pressure medication but his current medication is a combination capsule with 2 medications in it. I would rather he start only 1 medication at a time.  Please send in prescription for benazepril 10 mg tabs, 1 tab p.o. daily, #30, no refill. -thx

## 2022-09-09 NOTE — Telephone Encounter (Signed)
Pt is seeking advice in regards to his blood pressure.  He reports that he has had a few appointments recently and his BP has been elevated when checked. The readings that he has been receiving are 135/90 and 138/90. He would like to know if he should resume the Amlodipine prior to his appointment on 10/4? Please contact patient to inform him on whats best.

## 2022-09-09 NOTE — Telephone Encounter (Signed)
Spoke with patient regarding results/recommendations.  

## 2022-09-22 ENCOUNTER — Ambulatory Visit: Payer: BC Managed Care – PPO | Admitting: Family Medicine

## 2022-09-22 ENCOUNTER — Encounter: Payer: Self-pay | Admitting: Family Medicine

## 2022-09-22 VITALS — BP 105/71 | HR 69 | Temp 97.8°F | Ht 71.0 in | Wt 186.6 lb

## 2022-09-22 DIAGNOSIS — I10 Essential (primary) hypertension: Secondary | ICD-10-CM | POA: Diagnosis not present

## 2022-09-22 DIAGNOSIS — N529 Male erectile dysfunction, unspecified: Secondary | ICD-10-CM | POA: Diagnosis not present

## 2022-09-22 MED ORDER — SILDENAFIL CITRATE 50 MG PO TABS: ORAL_TABLET | ORAL | 6 refills | Status: DC

## 2022-09-22 NOTE — Progress Notes (Signed)
OFFICE VISIT  09/22/2022  CC:  Chief Complaint  Patient presents with   Hypertension    Follow up;     Patient is a 56 y.o. male who presents for 6-week follow-up hypertension and ED with premature ejaculation. A/P as of last visit: "#1 essential hypertension. His blood pressure is actually in the low normal range today.  He will start home blood pressure monitoring and if it remains like it is today he will stop his blood pressure pill and monitor closely.   2 erectile dysfunction. He wants to try Viagra, which I prescribed him and he has on hand. He has history of premature ejaculation that causes him considerable angst. He asks what he could possibly try.  We discussed trial of tramadol, although this is off label use. Discussed the possible drawbacks of this type of medication, including habit-forming, impairment, constipation. We went ahead and decided to move forward with this, taking it in small amounts and being careful to not mix with alcohol. 50 mg tramadol, #30 prescribed today."  INTERIM HX: He did end up getting off his amlodipine-benazepril. He then called on on 09/09/2022 stating he had had a couple of elevated blood pressure so I started benazepril 10 mg daily.  Since that time daily checks have been anywhere from 120s up to the 130s.  One reading in the 150s.  He is happy to say that he has a girlfriend and is sexually active. He notes that the sildenafil has helped excellently.  No problems with erectile function now and no problems with premature ejaculation. He tried the tramadol 1 time but is apprehensive about this medication and feels like he can do without it.  ED visit 09/07/2022 for rash.  Diagnosed with contact/irritant dermatitis and was given a 5-day prednisone taper and triamcinolone cream.   Past Medical History:  Diagnosis Date   Chronic renal insufficiency, stage 2 (mild)    GFR around 60-65 (sCr 1.2-1.3 avg from 2010-2018). Slight inc in sCr to  1.35-1.4 avg 2019-2021.   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   Essential hypertension    started med 01/2020   History of pneumonia approx 2014   Hypertriglyceridemia    Increased prostate specific antigen (PSA) velocity 02/2021   Liver cyst    Left hepatic dome 2 cm cyst, CT [id number]   Metabolic syndrome    elev trigs, low HDL, HTN   Pruritus ani, intermittent 01/17/2013   Splenomegaly    resolved CT Sep2013-->this is how his CML was detected/diagnosed    Past Surgical History:  Procedure Laterality Date   COLONOSCOPY  01/09/2018   mild sigmoid diverticulosis, otherwise normal.  Recall 10 yrs.   REFRACTIVE SURGERY  2000   TOENAIL EXCISION  01/16/2013   ingrown toenail- not removed    TONSILECTOMY, ADENOIDECTOMY, BILATERAL MYRINGOTOMY AND TUBES     age 68   TREATMENT FISTULA ANAL  2015   Anal fistula surgically repaired.    Outpatient Medications Prior to Visit  Medication Sig Dispense Refill   benazepril (LOTENSIN) 10 MG tablet Take 1 tablet (10 mg total) by mouth daily. 30 tablet 0   CREATINE MONOHYDRATE PO Take 3,000 mg by mouth every morning.     imatinib (GLEEVEC) 400 MG tablet Take 1 tablet (400 mg total) by mouth daily. Take with meals and large glass of water.Caution:Chemotherapy. 30 tablet 5   traMADol (ULTRAM) 50 MG tablet 1-2 tabs po bid prn pain 30  tablet 0   triamcinolone ointment (KENALOG) 0.5 % Apply 1 Application topically 2 (two) times daily. 30 g 0   sildenafil (VIAGRA) 50 MG tablet 1-2 tabs po qd prn 10 tablet 1   amLODipine-benazepril (LOTREL) 5-20 MG capsule TAKE 1 CAPSULE BY MOUTH EVERY DAY (Patient not taking: Reported on 09/01/2022) 90 capsule 3   No facility-administered medications prior to visit.    No Known Allergies  ROS As per HPI  PE:    09/22/2022    2:45 PM 09/07/2022    3:11 PM 09/07/2022    3:08 PM  Vitals with BMI  Height '5\' 11"'$   '5\' 11"'$   Weight 186 lbs 10 oz  185 lbs  BMI 68.11  57.26   Systolic 203 559   Diastolic 71 97   Pulse 69 65      Physical Exam  Gen: Alert, well appearing.  Patient is oriented to person, place, time, and situation. AFFECT: pleasant, lucid thought and speech. Further exam today.  LABS:  Last CBC Lab Results  Component Value Date   WBC 5.4 08/11/2022   HGB 13.3 08/11/2022   HCT 39.0 08/11/2022   MCV 97.2 08/11/2022   MCH 33.2 02/09/2022   RDW 13.4 08/11/2022   PLT 171.0 74/16/3845   Last metabolic panel Lab Results  Component Value Date   GLUCOSE 91 08/11/2022   NA 138 08/11/2022   K 4.0 08/11/2022   CL 106 08/11/2022   CO2 22 08/11/2022   BUN 17 08/11/2022   CREATININE 1.53 (H) 08/11/2022   GFRNONAA 50 (L) 02/09/2022   CALCIUM 9.3 08/11/2022   PROT 6.7 08/11/2022   ALBUMIN 4.6 08/11/2022   BILITOT 0.8 08/11/2022   ALKPHOS 60 08/11/2022   AST 26 08/11/2022   ALT 23 08/11/2022   ANIONGAP 5 02/09/2022   IMPRESSION AND PLAN:  #1 hypertension, overall doing well on benazepril 10 mg a day. He will continue daily blood pressure checks and if he is starting to notice more consistently in the 130s or above he will call and we will increase his benazepril.  #2 erectile dysfunction.  Doing well with sildenafil 50 mg, 1-2 daily as needed. He requested #30 tabs for each prescription so I sent this in today specifying 30 tabs with 6 refills.  An After Visit Summary was printed and given to the patient.  FOLLOW UP: Return in about 10 months (around 07/24/2023) for annual CPE (fasting).  Signed:  [name], MD           09/22/2022

## 2022-10-01 ENCOUNTER — Other Ambulatory Visit: Payer: Self-pay | Admitting: Family Medicine

## 2022-10-04 ENCOUNTER — Telehealth: Payer: BC Managed Care – PPO | Admitting: Family Medicine

## 2022-10-04 DIAGNOSIS — B9689 Other specified bacterial agents as the cause of diseases classified elsewhere: Secondary | ICD-10-CM

## 2022-10-04 DIAGNOSIS — J019 Acute sinusitis, unspecified: Secondary | ICD-10-CM | POA: Diagnosis not present

## 2022-10-04 MED ORDER — AMOXICILLIN-POT CLAVULANATE 875-125 MG PO TABS: 1.0000 | ORAL_TABLET | Freq: Two times a day (BID) | ORAL | 0 refills | Status: AC

## 2022-10-04 MED ORDER — BENZONATATE 100 MG PO CAPS: 200.0000 mg | ORAL_CAPSULE | Freq: Two times a day (BID) | ORAL | 0 refills | Status: DC | PRN

## 2022-10-04 NOTE — Progress Notes (Signed)
Virtual Visit Consent   Michael [name], you are scheduled for a virtual visit with a Michael [name] provider today. Just as with appointments in the office, your consent must be obtained to participate. Your consent will be active for this visit and any virtual visit you may have with one of our providers in the next 365 days. If you have a MyChart account, a copy of this consent can be sent to you electronically.  As this is a virtual visit, video technology does not allow for your provider to perform a traditional examination. This may limit your provider's ability to fully assess your condition. If your provider identifies any concerns that need to be evaluated in person or the need to arrange testing (such as labs, EKG, etc.), we will make arrangements to do so. Although advances in technology are sophisticated, we cannot ensure that it will always work on either your end or our end. If the connection with a video visit is poor, the visit may have to be switched to a telephone visit. With either a video or telephone visit, we are not always able to ensure that we have a secure connection.  By engaging in this virtual visit, you consent to the provision of healthcare and authorize for your insurance to be billed (if applicable) for the services provided during this visit. Depending on your insurance coverage, you may receive a charge related to this service.  I need to obtain your verbal consent now. Are you willing to proceed with your visit today? Michael [name] has provided verbal consent on 10/04/2022 for a virtual visit (video or telephone). Michael [name]  Date: 10/04/2022 10:32 AM  Virtual Visit via Video Note   I, Michael [name], connected with  Michael [name]  ([id number], Nov 12, 1966) on 10/04/22 at 10:30 AM EDT by a video-enabled telemedicine application and verified that I am speaking with the correct person using two identifiers.  Location: Patient: Virtual Visit Location  Patient: Home Provider: Virtual Visit Location Provider: Home Office   I discussed the limitations of evaluation and management by telemedicine and the availability of in person appointments. The patient expressed understanding and agreed to proceed.    History of Present Illness: Michael [name] is a 56 y.o. who identifies as a male who was assigned male at birth, and is being seen today for cough and congestion. Onset was 5 days ago- started with cough. Developed more sinus drainage, breathing causes a cough, sore throat. Coughing green mucus. Denies chest pain, shortness of breath, ear pain. Recent COVID vaccines.    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 Take with meals and large glass of water.Caution:Chemotherapy., Disp: 30 tablet,  Rfl: 5   sildenafil (VIAGRA) 50 MG tablet, 1-2 tabs po qd prn, Disp: 30 tablet, Rfl: 6   traMADol (ULTRAM) 50 MG tablet, 1-2 tabs po bid prn pain, Disp: 30 tablet, Rfl: 0   triamcinolone ointment (KENALOG) 0.5 %, Apply 1 Application topically 2 (two) times daily., Disp: 30 g, Rfl: 0  Observations/Objective: Patient is well-developed, well-nourished in no acute  distress.  Resting comfortably  at home.  Head is normocephalic, atraumatic.  No labored breathing.  Speech is clear and coherent with logical content.  Patient is alert and oriented at baseline.  Cough Nasal tone noted   Assessment and Plan:  1. Acute bacterial sinusitis  - amoxicillin-clavulanate (AUGMENTIN) 875-125 MG tablet; Take 1 tablet by mouth 2 (two) times daily for 7 days.  Dispense: 14 tablet; Refill: 0 - benzonatate (TESSALON) 100 MG capsule; Take 2 capsules (200 mg total) by mouth 2 (two) times daily as needed for cough.  Dispense: 20 capsule; Refill: 0  -rest -hydrate -take medication has ordered -mucinex encouraged  -recent covid vaccine on 10/4- does not preclude this being covid- far enough out to not be vaccine related   Reviewed side effects, risks and benefits of medication.    Patient acknowledged agreement and understanding of the plan.   Past Medical, Surgical, Social History, Allergies, and Medications have been Reviewed.    Follow Up Instructions: I discussed the assessment and treatment plan with the patient. The patient was provided an opportunity to ask questions and all were answered. The patient agreed with the plan and demonstrated an understanding of the instructions.  A copy of instructions were sent to the patient via MyChart unless otherwise noted below.     The patient was advised to call back or seek an in-person evaluation if the symptoms worsen or if the condition fails to improve as anticipated.  Time:  I spent 10 minutes with the patient via telehealth technology discussing the above problems/concerns.    Michael [name]

## 2022-10-04 NOTE — Patient Instructions (Signed)
Dorene Ar Kawai, thank you for joining Perlie Mayo, NP for today's virtual visit.  While this provider is not your primary care provider (PCP), if your PCP is located in our provider database this encounter information will be shared with them immediately following your visit.  [facility] account gives you access to today's visit and all your visits, tests, and labs performed at [facility] " click here if you don't have a [facility] account or go to [email or web address]  Consent: (Patient) Dorene Ar Mckillop provided verbal consent for this virtual visit at the beginning of the encounter.  Current Medications:  Current Outpatient Medications:    amoxicillin-clavulanate (AUGMENTIN) 875-125 MG tablet, Take 1 tablet by mouth 2 (two) times daily for 7 days., Disp: 14 tablet, Rfl: 0   benzonatate (TESSALON) 100 MG capsule, Take 2 capsules (200 mg total) by mouth 2 (two) times daily as needed for cough., Disp: 20 capsule, Rfl: 0   amLODipine-benazepril (LOTREL) 5-20 MG capsule, TAKE 1 CAPSULE BY MOUTH EVERY DAY (Patient not taking: Reported on 09/01/2022), Disp: 90 capsule, Rfl: 3   benazepril (LOTENSIN) 10 MG tablet, TAKE 1 TABLET BY MOUTH EVERY DAY, Disp: 90 tablet, Rfl: 1   CREATINE MONOHYDRATE PO, Take 3,000 mg by mouth every morning., Disp: , Rfl:    imatinib (GLEEVEC) 400 MG tablet, Take 1 tablet (400 mg total) by mouth daily. Take with meals and large glass of water.Caution:Chemotherapy., Disp: 30 tablet, Rfl: 5   sildenafil (VIAGRA) 50 MG tablet, 1-2 tabs po qd prn, Disp: 30 tablet, Rfl: 6   traMADol (ULTRAM) 50 MG tablet, 1-2 tabs po bid prn pain, Disp: 30 tablet, Rfl: 0   triamcinolone ointment (KENALOG) 0.5 %, Apply 1 Application topically 2 (two) times daily., Disp: 30 g, Rfl: 0   Medications ordered in this encounter:  Meds ordered this encounter  Medications   amoxicillin-clavulanate (AUGMENTIN) 875-125 MG tablet    Sig: Take 1 tablet by mouth  2 (two) times daily for 7 days.    Dispense:  14 tablet    Refill:  0    Order Specific Question:   Supervising Provider    Answer:   Chase Picket [[id number]]   benzonatate (TESSALON) 100 MG capsule    Sig: Take 2 capsules (200 mg total) by mouth 2 (two) times daily as needed for cough.    Dispense:  20 capsule    Refill:  0    Order Specific Question:   Supervising Provider    Answer:   Chase Picket [id number]     *If you need refills on other medications prior to your next appointment, please contact your pharmacy*  Follow-Up: Call back or seek an in-person evaluation if the symptoms worsen or if the condition fails to improve as anticipated.  [facility] [phone]  Other Instructions Sinus Infection, Adult A sinus infection is soreness and swelling (inflammation) of your sinuses. Sinuses are hollow spaces in the bones around your face. They are located: Around your eyes. In the middle of your forehead. Behind your nose. In your cheekbones. Your sinuses and nasal passages are lined with a fluid called mucus. Mucus drains out of your sinuses. Swelling can trap mucus in your sinuses. This lets germs (bacteria, virus, or fungus) grow, which leads to infection. Most of the time, this condition is caused by a virus. What are the causes? Allergies. Asthma. Germs. Things that block your nose or sinuses. Growths in the nose (  nasal polyps). Chemicals or irritants in the air. A fungus. This is rare. What increases the risk? Having a weak body defense system (immune system). Doing a lot of swimming or diving. Using nasal sprays too much. Smoking. What are the signs or symptoms? The main symptoms of this condition are pain and a feeling of pressure around the sinuses. Other symptoms include: Stuffy nose (congestion). This may make it hard to breathe through your nose. Runny nose (drainage). Soreness, swelling, and warmth in the sinuses. A cough that may  get worse at night. Being unable to smell and taste. Mucus that collects in the throat or the back of the nose (postnasal drip). This may cause a sore throat or bad breath. Being very tired (fatigued). A fever. How is this diagnosed? Your symptoms. Your medical history. A physical exam. Tests to find out if your condition is short-term (acute) or long-term (chronic). Your doctor may: Check your nose for growths (polyps). Check your sinuses using a tool that has a light on one end (endoscope). Check for allergies or germs. Do imaging tests, such as an MRI or CT scan. How is this treated? Treatment for this condition depends on the cause and whether it is short-term or long-term. If caused by a virus, your symptoms should go away on their own within 10 days. You may be given medicines to relieve symptoms. They include: Medicines that shrink swollen tissue in the nose. A spray that treats swelling of the nostrils. Rinses that help get rid of thick mucus in your nose (nasal saline washes). Medicines that treat allergies (antihistamines). Over-the-counter pain relievers. If caused by bacteria, your doctor may wait to see if you will get better without treatment. You may be given antibiotic medicine if you have: A very bad infection. A weak body defense system. If caused by growths in the nose, surgery may be needed. Follow these instructions at home: Medicines Take, use, or apply over-the-counter and prescription medicines only as told by your doctor. These may include nasal sprays. If you were prescribed an antibiotic medicine, take it as told by your doctor. Do not stop taking it even if you start to feel better. Hydrate and humidify  Drink enough water to keep your pee (urine) pale yellow. Use a cool mist humidifier to keep the humidity level in your home above 50%. Breathe in steam for 10-15 minutes, 3-4 times a day, or as told by your doctor. You can do this in the bathroom while a  hot shower is running. Try not to spend time in cool or dry air. Rest Rest as much as you can. Sleep with your head raised (elevated). Make sure you get enough sleep each night. General instructions  Put a warm, moist washcloth on your face 3-4 times a day, or as often as told by your doctor. Use nasal saline washes as often as told by your doctor. Wash your hands often with soap and water. If you cannot use soap and water, use hand sanitizer. Do not smoke. Avoid being around people who are smoking (secondhand smoke). Keep all follow-up visits. Contact a doctor if: You have a fever. Your symptoms get worse. Your symptoms do not get better within 10 days. Get help right away if: You have a very bad headache. You cannot stop vomiting. You have very bad pain or swelling around your face or eyes. You have trouble seeing. You feel confused. Your neck is stiff. You have trouble breathing. These symptoms may be an emergency.  Get help right away. Call 911. Do not wait to see if the symptoms will go away. Do not drive yourself to the hospital. Summary A sinus infection is swelling of your sinuses. Sinuses are hollow spaces in the bones around your face. This condition is caused by tissues in your nose that become inflamed or swollen. This traps germs. These can lead to infection. If you were prescribed an antibiotic medicine, take it as told by your doctor. Do not stop taking it even if you start to feel better. Keep all follow-up visits. This information is not intended to replace advice given to you by your health care provider. Make sure you discuss any questions you have with your health care provider. Document Revised: 11/10/2021 Document Reviewed: 11/10/2021 Elsevier Patient Education  [place].    If you have been instructed to have an in-person evaluation today at a local Urgent Care facility, please use the link below. It will take you to a list of all of our  available [facility], including address, phone number and hours of operation. Please do not delay care.  [facility]  If you or a family member do not have a primary care provider, use the link below to schedule a visit and establish care. When you choose a [facility] primary care physician or advanced practice provider, you gain a long-term partner in health. Find a Primary Care Provider  Learn more about [facility] in-office and virtual care options: [facility]

## 2022-10-15 ENCOUNTER — Other Ambulatory Visit: Payer: Self-pay

## 2022-10-15 MED ORDER — BENAZEPRIL HCL 10 MG PO TABS: 10.0000 mg | ORAL_TABLET | Freq: Every day | ORAL | 1 refills | Status: DC

## 2022-11-15 ENCOUNTER — Telehealth: Payer: Self-pay | Admitting: Family Medicine

## 2022-11-15 NOTE — Telephone Encounter (Signed)
Patient is wanting some information in regards to the medication sildenafil. He reports that he believes he should be receiving 30 pill not 6, however his insurance only allows 6 pills. He reports that if Dr. Anitra Lauth could provided something in writing that reflects a need for 30 pills and fax it to [phone]. He is also interested in maybe starting mail order pharmacy for this and was informed he would need a new prescription for a 90 supply in order for this to work. Please advise patient.

## 2022-11-16 ENCOUNTER — Telehealth: Payer: Self-pay | Admitting: Family Medicine

## 2022-11-16 MED ORDER — SILDENAFIL CITRATE 50 MG PO TABS: ORAL_TABLET | ORAL | 3 refills | Status: DC

## 2022-11-16 NOTE — Telephone Encounter (Signed)
Okay, new prescription done. I will print letter.

## 2022-11-16 NOTE — Telephone Encounter (Signed)
Please Advise. Pt confirmed mail order pharmacy

## 2022-11-16 NOTE — Telephone Encounter (Signed)
Pt advised new prescription and letter was faxed to number provided.

## 2022-11-16 NOTE — Telephone Encounter (Signed)
Letter encounter

## 2023-02-23 DIAGNOSIS — C9210 Chronic myeloid leukemia, BCR/ABL-positive, not having achieved remission: Secondary | ICD-10-CM | POA: Diagnosis not present

## 2023-03-02 ENCOUNTER — Encounter: Payer: Self-pay | Admitting: Internal Medicine

## 2023-03-02 ENCOUNTER — Inpatient Hospital Stay: Payer: BC Managed Care – PPO | Attending: Internal Medicine | Admitting: Internal Medicine

## 2023-03-02 DIAGNOSIS — C9210 Chronic myeloid leukemia, BCR/ABL-positive, not having achieved remission: Secondary | ICD-10-CM

## 2023-03-02 MED ORDER — IMATINIB MESYLATE 400 MG PO TABS: 400.0000 mg | ORAL_TABLET | Freq: Every day | ORAL | 5 refills | Status: DC

## 2023-03-02 NOTE — Progress Notes (Signed)
[facility] [phone]   [phone]  PROGRESS NOTE FOR TELEMEDICINE VISITS  Tammi Sou, MD [address] [place] [address]  I connected withNAME@ on 03/02/23 at  8:30 AM EDT by video enabled telemedicine visit and verified that I am speaking with the correct person using two identifiers.   I discussed the limitations, risks, security and privacy concerns of performing an evaluation and management service by telemedicine and the availability of in-person appointments. I also discussed with the patient that there may be a patient responsible charge related to this service. The patient expressed understanding and agreed to proceed.  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   PRIOR THERAPY: None   CURRENT THERAPY: Imatinib 400 mg by mouth daily started February 2009  INTERVAL HISTORY: Michael [name] 57 y.o. male has a visual video follow-up visit with me today for evaluation and recommendation regarding his condition.  The patient is feeling fine today with no concerning complaints.  He denied having any chest pain, shortness of breath, cough or hemoptysis.  He has no nausea, vomiting, diarrhea or constipation.  He has no headache or visual changes.  He has no recent weight loss or night sweats.  He has been tolerating his treatment with imatinib fairly well.  MEDICAL HISTORY: Past Medical History:  Diagnosis Date   Chronic renal insufficiency, stage 2 (mild)    GFR around 60-65 (sCr 1.2-1.3 avg from 2010-2018). Slight inc in sCr to 1.35-1.4 avg 2019-2021.   CML in remission ([facility])    Dx'd 2009; treated with [place] since that time.   Essential hypertension    started med 01/2020   History of pneumonia approx 2014   Hypertriglyceridemia    Increased prostate specific  antigen (PSA) velocity 02/2021   Liver cyst    Left hepatic dome 2 cm cyst, CT [id number]   Metabolic syndrome    elev trigs, low HDL, HTN   Pruritus ani, intermittent 01/17/2013   Splenomegaly    resolved CT Sep2013-->this is how his CML was detected/diagnosed    ALLERGIES:  has No Known Allergies.  MEDICATIONS:  Current Outpatient Medications  Medication Sig Dispense Refill   amLODipine-benazepril (LOTREL) 5-20 MG capsule TAKE 1 CAPSULE BY MOUTH EVERY DAY (Patient not taking: Reported on 09/01/2022) 90 capsule 3   benazepril (LOTENSIN) 10 MG tablet Take 1 tablet (10 mg total) by mouth daily. 90 tablet 1   benzonatate (TESSALON) 100 MG capsule Take 2 capsules (200 mg total) by mouth 2 (two) times daily as needed for cough. 20 capsule 0   CREATINE MONOHYDRATE PO Take 3,000 mg by mouth every morning.     imatinib (GLEEVEC) 400 MG tablet Take 1 tablet (400 mg total) by mouth daily. Take with meals and large glass of water.Caution:Chemotherapy. 30 tablet 5   sildenafil (VIAGRA) 50 MG tablet 1-2 tabs po qd prn 90 tablet 3   traMADol (ULTRAM) 50 MG tablet 1-2 tabs po bid prn pain 30 tablet 0   triamcinolone ointment (KENALOG) 0.5 % Apply 1 Application topically 2 (two) times daily. 30 g 0   No current facility-administered medications for this visit.    SURGICAL HISTORY:  Past Surgical History:  Procedure Laterality Date   COLONOSCOPY  01/09/2018   mild sigmoid diverticulosis, otherwise normal.  Recall 10 yrs.  REFRACTIVE SURGERY  2000   TOENAIL EXCISION  01/16/2013   ingrown toenail- not removed    TONSILECTOMY, ADENOIDECTOMY, BILATERAL MYRINGOTOMY AND TUBES     age 38   TREATMENT FISTULA ANAL  2015   Anal fistula surgically repaired.    REVIEW OF SYSTEMS:  A comprehensive review of systems was negative.    LABORATORY DATA: Lab Results  Component Value Date   WBC 5.4 08/11/2022   HGB 13.3 08/11/2022   HCT 39.0 08/11/2022   MCV 97.2 08/11/2022   PLT 171.0 08/11/2022       Chemistry      Component Value Date/Time   NA 138 08/11/2022 1521   NA 141 08/23/2017 1545   K 4.0 08/11/2022 1521   K 3.8 08/23/2017 1545   CL 106 08/11/2022 1521   CL 108 (H) 01/19/2013 1206   CO2 22 08/11/2022 1521   CO2 26 08/23/2017 1545   BUN 17 08/11/2022 1521   BUN 20.2 08/23/2017 1545   CREATININE 1.53 (H) 08/11/2022 1521   CREATININE 1.61 (H) 02/09/2022 0757   CREATININE 1.36 (H) 01/31/2020 1002   CREATININE 1.3 08/23/2017 1545   GLU 97 01/17/2015 0000      Component Value Date/Time   CALCIUM 9.3 08/11/2022 1521   CALCIUM 9.3 08/23/2017 1545   ALKPHOS 60 08/11/2022 1521   ALKPHOS 100 08/23/2017 1545   AST 26 08/11/2022 1521   AST 36 02/09/2022 0757   AST 33 08/23/2017 1545   ALT 23 08/11/2022 1521   ALT 31 02/09/2022 0757   ALT 32 08/23/2017 1545   BILITOT 0.8 08/11/2022 1521   BILITOT 0.9 02/09/2022 0757   BILITOT 0.54 08/23/2017 1545       RADIOGRAPHIC STUDIES: No results found.  ASSESSMENT AND PLAN: This is a very pleasant 57 years old white male with chronic myeloid leukemia diagnosed in February 2009. The patient has been on treatment with Gleevec 400 mg p.o. daily since that time and he is feeling fine. The patient has been tolerating this treatment well with no concerning adverse effects. He had lab work performed at [facility] recently but unfortunately the results are not available for me. We will wait for the lab results but I recommended for the patient to continue his current treatment with imatinib with the same dose. I will see him back for a virtual visit in 6 months after repeating blood work. The patient was advised to call immediately if he has any concerning symptoms in the interval.   I discussed the assessment and treatment plan with the patient. The patient was provided an opportunity to ask questions and all were answered. The patient agreed with the plan and demonstrated an understanding of the instructions.   The patient was advised  to call back or seek an in-person evaluation if the symptoms worsen or if the condition fails to improve as anticipated.  I provided 15 minutes of face-to-face video visit time during this encounter, and > 50% was spent counseling as documented under my assessment & plan.  Eilleen Kempf, MD 03/02/2023 8:56 AM  Disclaimer: This note was dictated with voice recognition software. Similar sounding words can inadvertently be transcribed and may not be corrected upon review.

## 2023-05-26 ENCOUNTER — Ambulatory Visit: Payer: BC Managed Care – PPO | Admitting: Family Medicine

## 2023-05-26 ENCOUNTER — Other Ambulatory Visit: Payer: Self-pay

## 2023-05-26 ENCOUNTER — Encounter: Payer: Self-pay | Admitting: Family Medicine

## 2023-05-26 VITALS — BP 130/84 | HR 62 | Ht 71.0 in | Wt 193.0 lb

## 2023-05-26 DIAGNOSIS — R0602 Shortness of breath: Secondary | ICD-10-CM

## 2023-05-26 DIAGNOSIS — J069 Acute upper respiratory infection, unspecified: Secondary | ICD-10-CM

## 2023-05-26 DIAGNOSIS — R233 Spontaneous ecchymoses: Secondary | ICD-10-CM

## 2023-05-26 DIAGNOSIS — R252 Cramp and spasm: Secondary | ICD-10-CM | POA: Diagnosis not present

## 2023-05-26 MED ORDER — PREDNISONE 20 MG PO TABS: ORAL_TABLET | ORAL | 0 refills | Status: DC

## 2023-05-26 NOTE — Patient Instructions (Signed)
For muscle cramp prevention: Take over the counter magnesium oxide 500mg  tab nightly and drink 3 ounces of tonic water every night.

## 2023-05-26 NOTE — Progress Notes (Signed)
OFFICE VISIT  05/26/2023  CC:  Chief Complaint  Patient presents with   Breathing Concern    Ongoing since April; believes it may be from previous cold in October. Described as constricted breathing.     Patient is a 57 y.o. male who presents for breathing concern.  HPI: Feels like he wants to cough easily when taking in breath, particularly when he takes in a deep breath. Denies wheezing.  No chest pain. This seemed to also come after he got a URI with cough illness in October 2023.  He was prescribed Augmentin and Tessalon. COVID testing negative at that time. He recalls not having taste for a little while.  All symptoms essentially resolved appropriately and this subtle breathing abnormality has persisted. He is particularly concerned because his dad was diagnosed with idiopathic pulmonary fibrosis when he was around 57 years old.   ROS: no fever, no weight loss, no lower extremity swelling, no PND, no orthopnea, no DOE. He is active playing pickle ball and has no problems. He does have relatively frequent leg cramps. Has had a couple of bruises on the wrists but nothing elsewhere.  Past Medical History:  Diagnosis Date   Chronic renal insufficiency, stage 2 (mild)    GFR around 60-65 (sCr 1.2-1.3 avg from 2010-2018). Slight inc in sCr to 1.35-1.4 avg 2019-2021.   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   Essential hypertension    started med 01/2020   History of pneumonia approx 2014   Hypertriglyceridemia    Increased prostate specific antigen (PSA) velocity 02/2021   Liver cyst    Left hepatic dome 2 cm cyst, CT Sep2013   Metabolic syndrome    elev trigs, low HDL, HTN   Pruritus ani, intermittent 01/17/2013   Splenomegaly    resolved CT Sep2013-->this is how his CML was detected/diagnosed    Past Surgical History:  Procedure Laterality Date   COLONOSCOPY  01/09/2018   mild sigmoid diverticulosis, otherwise normal.  Recall 10 yrs.    REFRACTIVE SURGERY  2000   TOENAIL EXCISION  01/16/2013   ingrown toenail- not removed    TONSILECTOMY, ADENOIDECTOMY, BILATERAL MYRINGOTOMY AND TUBES     age 38   TREATMENT FISTULA ANAL  2015   Anal fistula surgically repaired.    Outpatient Medications Prior to Visit  Medication Sig Dispense Refill   benazepril (LOTENSIN) 10 MG tablet Take 1 tablet (10 mg total) by mouth daily. 90 tablet 1   CREATINE MONOHYDRATE PO Take 3,000 mg by mouth every morning.     imatinib (GLEEVEC) 400 MG tablet Take 1 tablet (400 mg total) by mouth daily. Take with meals and large glass of water.Caution:Chemotherapy. 30 tablet 5   sildenafil (VIAGRA) 50 MG tablet 1-2 tabs po qd prn 90 tablet 3   amLODipine-benazepril (LOTREL) 5-20 MG capsule TAKE 1 CAPSULE BY MOUTH EVERY DAY (Patient not taking: Reported on 09/01/2022) 90 capsule 3   benzonatate (TESSALON) 100 MG capsule Take 2 capsules (200 mg total) by mouth 2 (two) times daily as needed for cough. 20 capsule 0   traMADol (ULTRAM) 50 MG tablet 1-2 tabs po bid prn pain 30 tablet 0   triamcinolone ointment (KENALOG) 0.5 % Apply 1 Application topically 2 (two) times daily. 30 g 0   No facility-administered medications prior to visit.    No Known Allergies  Review of Systems  As per HPI  PE:    05/26/2023  3:24 PM 09/22/2022    2:45 PM 09/07/2022    3:11 PM  Vitals with BMI  Height 5\' 11"  5\' 11"    Weight 193 lbs 186 lbs 10 oz   BMI 26.93 26.04   Systolic 130 105 161  Diastolic 84 71 97  Pulse 62 69 65  02 sat 99% RA  Physical Exam  Gen: Alert, well appearing.  Patient is oriented to person, place, time, and situation. AFFECT: pleasant, lucid thought and speech. WRU:EAVW: no injection, icteris, swelling, or exudate.  EOMI, PERRLA. Mouth: lips without lesion/swelling.  Oral mucosa pink and moist. Oropharynx without erythema, exudate, or swelling.  CV: RRR, no m/r/g.   LUNGS: CTA bilat, nonlabored resps, good aeration in all lung fields. EXT:  no clubbing or cyanosis.  no edema.  Skin: He has a few petechia on each wrist.  LABS:  Last metabolic panel Lab Results  Component Value Date   GLUCOSE 91 08/11/2022   NA 138 08/11/2022   K 4.0 08/11/2022   CL 106 08/11/2022   CO2 22 08/11/2022   BUN 17 08/11/2022   CREATININE 1.53 (H) 08/11/2022   GFRNONAA 50 (L) 02/09/2022   CALCIUM 9.3 08/11/2022   PROT 6.7 08/11/2022   ALBUMIN 4.6 08/11/2022   BILITOT 0.8 08/11/2022   ALKPHOS 60 08/11/2022   AST 26 08/11/2022   ALT 23 08/11/2022   ANIONGAP 5 02/09/2022   Lab Results  Component Value Date   WBC 5.4 08/11/2022   HGB 13.3 08/11/2022   HCT 39.0 08/11/2022   MCV 97.2 08/11/2022   PLT 171.0 08/11/2022   IMPRESSION AND PLAN:  #1 shortness of breath. Very subtle symptoms, suspect postviral RAD. Prednisone 40 mg a day x 5 days then 20 mg a day x 5 days. Chest x-ray ordered. If no significant improvement or if chest x-ray abnormal we will proceed with appropriate CT chest imaging and/or pulmonary referral.  2.  Recurrent muscle cramps. Exertional/dehydration-related. Recommended over-the-counter magnesium and tonic water.  #3 bruises/petechiae. These are on areas where he could easily have done some mild trauma and not realized it. The concern is that he has a history of leukemia.  However, I reassured him at this point I do not think this is signs of recurrence. Of note, he states the lab evaluation and routine surveillance in March with his hematologist was normal.  An After Visit Summary was printed and given to the patient.  FOLLOW UP: Return in about 2 weeks (around 06/09/2023) for f/u breathing.  Signed:  Santiago Bumpers, MD           05/26/2023

## 2023-05-27 ENCOUNTER — Ambulatory Visit (HOSPITAL_BASED_OUTPATIENT_CLINIC_OR_DEPARTMENT_OTHER)
Admission: RE | Admit: 2023-05-27 | Discharge: 2023-05-27 | Disposition: A | Discharge status: Home / Self Care | Payer: BC Managed Care – PPO | Source: Ambulatory Visit | Attending: Family Medicine | Admitting: Family Medicine

## 2023-05-27 ENCOUNTER — Other Ambulatory Visit: Payer: Self-pay | Admitting: Family Medicine

## 2023-05-27 DIAGNOSIS — R0602 Shortness of breath: Secondary | ICD-10-CM | POA: Diagnosis not present

## 2023-06-10 ENCOUNTER — Ambulatory Visit: Payer: BC Managed Care – PPO | Admitting: Family Medicine

## 2023-06-16 ENCOUNTER — Telehealth: Payer: Self-pay | Admitting: Medical Oncology

## 2023-06-16 NOTE — Telephone Encounter (Signed)
Faxed Imatinib PA request to [facility] with receipt obtained.

## 2023-08-16 ENCOUNTER — Other Ambulatory Visit: Payer: Self-pay | Admitting: Family Medicine

## 2023-09-19 ENCOUNTER — Other Ambulatory Visit: Payer: Self-pay | Admitting: Internal Medicine

## 2023-09-19 DIAGNOSIS — C9210 Chronic myeloid leukemia, BCR/ABL-positive, not having achieved remission: Secondary | ICD-10-CM

## 2023-09-27 ENCOUNTER — Encounter: Payer: BC Managed Care – PPO | Admitting: Family Medicine

## 2023-10-11 ENCOUNTER — Ambulatory Visit (INDEPENDENT_AMBULATORY_CARE_PROVIDER_SITE_OTHER): Payer: BC Managed Care – PPO | Admitting: Family Medicine

## 2023-10-11 ENCOUNTER — Encounter: Payer: Self-pay | Admitting: Family Medicine

## 2023-10-11 VITALS — BP 138/89 | HR 53 | Ht 71.5 in | Wt 196.4 lb

## 2023-10-11 DIAGNOSIS — E781 Pure hyperglyceridemia: Secondary | ICD-10-CM

## 2023-10-11 DIAGNOSIS — Z0001 Encounter for general adult medical examination with abnormal findings: Secondary | ICD-10-CM

## 2023-10-11 DIAGNOSIS — Z125 Encounter for screening for malignant neoplasm of prostate: Secondary | ICD-10-CM | POA: Diagnosis not present

## 2023-10-11 DIAGNOSIS — C9210 Chronic myeloid leukemia, BCR/ABL-positive, not having achieved remission: Secondary | ICD-10-CM

## 2023-10-11 DIAGNOSIS — I10 Essential (primary) hypertension: Secondary | ICD-10-CM

## 2023-10-11 DIAGNOSIS — Z0000 Encounter for general adult medical examination without abnormal findings: Secondary | ICD-10-CM

## 2023-10-11 LAB — HEMOGLOBIN A1C: Hgb A1c MFr Bld: 5.6 % (ref 4.6–6.5)

## 2023-10-11 LAB — COMPREHENSIVE METABOLIC PANEL
ALT: 24 U/L (ref 0–53)
AST: 27 U/L (ref 0–37)
Albumin: 4.4 g/dL (ref 3.5–5.2)
Alkaline Phosphatase: 74 U/L (ref 39–117)
BUN: 19 mg/dL (ref 6–23)
CO2: 27 meq/L (ref 19–32)
Calcium: 9.2 mg/dL (ref 8.4–10.5)
Chloride: 107 meq/L (ref 96–112)
Creatinine, Ser: 1.49 mg/dL (ref 0.40–1.50)
GFR: 51.41 mL/min — ABNORMAL LOW (ref >60.00)
Glucose, Bld: 102 mg/dL — ABNORMAL HIGH (ref 70–99)
Potassium: 4.7 meq/L (ref 3.5–5.1)
Sodium: 141 meq/L (ref 135–145)
Total Bilirubin: 0.5 mg/dL (ref 0.2–1.2)
Total Protein: 6.6 g/dL (ref 6.0–8.3)

## 2023-10-11 LAB — CBC WITH DIFFERENTIAL/PLATELET
Basophils Absolute: 0 10*3/uL (ref 0.0–0.1)
Basophils Relative: 1.2 % (ref 0.0–3.0)
Eosinophils Absolute: 0.1 10*3/uL (ref 0.0–0.7)
Eosinophils Relative: 1.9 % (ref 0.0–5.0)
HCT: 41.2 % (ref 39.0–52.0)
Hemoglobin: 13.5 g/dL (ref 13.0–17.0)
Lymphocytes Relative: 35.4 % (ref 12.0–46.0)
Lymphs Abs: 1.3 10*3/uL (ref 0.7–4.0)
MCHC: 32.8 g/dL (ref 30.0–36.0)
MCV: 98.7 fL (ref 78.0–100.0)
Monocytes Absolute: 0.3 10*3/uL (ref 0.1–1.0)
Monocytes Relative: 7.4 % (ref 3.0–12.0)
Neutro Abs: 2 10*3/uL (ref 1.4–7.7)
Neutrophils Relative %: 54.1 % (ref 43.0–77.0)
Platelets: 179 10*3/uL (ref 150.0–400.0)
RBC: 4.17 Mil/uL — ABNORMAL LOW (ref 4.22–5.81)
RDW: 13.1 % (ref 11.5–15.5)
WBC: 3.7 10*3/uL — ABNORMAL LOW (ref 4.0–10.5)

## 2023-10-11 LAB — LIPID PANEL
Cholesterol: 180 mg/dL (ref 0–200)
HDL: 32.3 mg/dL — ABNORMAL LOW (ref >39.00)
LDL Cholesterol: 84 mg/dL (ref 0–99)
NonHDL: 147.69
Total CHOL/HDL Ratio: 6
Triglycerides: 318 mg/dL — ABNORMAL HIGH (ref 0.0–149.0)
VLDL: 63.6 mg/dL — ABNORMAL HIGH (ref 0.0–40.0)

## 2023-10-11 LAB — PSA: PSA: 4.34 ng/mL — ABNORMAL HIGH (ref 0.10–4.00)

## 2023-10-11 MED ORDER — AMLODIPINE BESY-BENAZEPRIL HCL 5-10 MG PO CAPS: 1.0000 | ORAL_CAPSULE | Freq: Every day | ORAL | 1 refills | Status: DC

## 2023-10-11 NOTE — Progress Notes (Signed)
Office Note 10/11/2023  CC:  Chief Complaint  Patient presents with   Annual Exam    Pt is fasting.     HPI:  Patient is a 57 y.o. male who is here for annual health maintenance exam and follow-up hypertension. Michael [name] feels well. Healthy diet and exercise.   Past Medical History:  Diagnosis Date   Chronic renal insufficiency, stage 2 (mild)    GFR around 60-65 (sCr 1.2-1.3 avg from 2010-2018). Slight inc in sCr to 1.35-1.4 avg 2019-2021.   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   Essential hypertension    started med 01/2020   Family history of pulmonary fibrosis    Father   History of pneumonia approx 2014   Hypertriglyceridemia    Increased prostate specific antigen (PSA) velocity 02/2021   Liver cyst    Left hepatic dome 2 cm cyst, CT Sep2013   Metabolic syndrome    elev trigs, low HDL, HTN   Pruritus ani, intermittent 01/17/2013   Splenomegaly    resolved CT Sep2013-->this is how his CML was detected/diagnosed    Past Surgical History:  Procedure Laterality Date   COLONOSCOPY  01/09/2018   mild sigmoid diverticulosis, otherwise normal.  Recall 10 yrs.   REFRACTIVE SURGERY  2000   TOENAIL EXCISION  01/16/2013   ingrown toenail- not removed    TONSILECTOMY, ADENOIDECTOMY, BILATERAL MYRINGOTOMY AND TUBES     age 43   TREATMENT FISTULA ANAL  2015   Anal fistula surgically repaired.    Family History  Problem Relation Age of Onset   Heart disease Mother        died from a  reaction to heparin    Cancer Father 47       prostate   Prostate cancer Father    Heart disease Father    Pulmonary fibrosis Father    Colon cancer Neg Hx    Colon polyps Neg Hx     Social History   Socioeconomic History   Marital status: Married    Spouse name: Not on file   Number of children: Not on file   Years of education: Not on file   Highest education level: Bachelor's degree (e.g., BA, AB, BS)  Occupational History   Not on file   Tobacco Use   Smoking status: Never   Smokeless tobacco: Never  Vaping Use   Vaping status: Never Used  Substance and Sexual Activity   Alcohol use: Yes    Comment: occasional    Drug use: No   Sexual activity: Not on file  Other Topics Concern   Not on file  Social History Narrative   Divorced 2023, 1 son and 1 daughter.   Orig from [place].   Grad from [employer].   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   No T/A/Ds.   Social Determinants of Health   Financial Resource Strain: Low Risk  (10/07/2023)   Overall Financial Resource Strain (CARDIA)    Difficulty of Paying Living Expenses: Not hard at all  Food Insecurity: No Food Insecurity (10/07/2023)   Hunger Vital Sign    Worried About Running Out of Food in the Last Year: Never true    Ran Out of Food in the Last Year: Never true  Transportation Needs: No Transportation Needs (10/07/2023)   PRAPARE - Administrator, Civil Service (Medical): No    Lack of Transportation (Non-Medical): No  Physical  Activity: Sufficiently Active (10/07/2023)   Exercise Vital Sign    Days of Exercise per Week: 6 days    Minutes of Exercise per Session: 30 min  Stress: No Stress Concern Present (10/07/2023)   [employer] of Occupational Health - Occupational Stress Questionnaire    Feeling of Stress : Only a little  Social Connections: Moderately Isolated (10/07/2023)   Social Connection and Isolation Panel [NHANES]    Frequency of Communication with Friends and Family: Three times a week    Frequency of Social Gatherings with Friends and Family: Once a week    Attends Religious Services: Never    Database administrator or Organizations: Yes    Attends Engineer, structural: More than 4 times per year    Marital Status: Divorced  Catering manager Violence: Not on file    Outpatient Medications Prior to Visit  Medication Sig Dispense Refill   CREATINE MONOHYDRATE PO Take 3,000  mg by mouth every morning.     imatinib (GLEEVEC) 400 MG tablet TAKE 1 TABLET BY MOUTH 1 TIME A DAY TAKE WITH MEALS AND LARGE GLASS OF WATER 30 tablet 5   Magnesium 200 MG TABS Take 200 mg by mouth daily.     sildenafil (VIAGRA) 50 MG tablet 1-2 tabs po qd prn 90 tablet 3   benazepril (LOTENSIN) 10 MG tablet TAKE 1 TABLET DAILY 90 tablet 0   predniSONE (DELTASONE) 20 MG tablet 2 tabs po qd x 5d then 1 tab po qd x 5d 15 tablet 0   No facility-administered medications prior to visit.    No Known Allergies  Review of Systems  Constitutional:  Negative for appetite change, chills, fatigue and fever.  HENT:  Negative for congestion, dental problem, ear pain and sore throat.   Eyes:  Negative for discharge, redness and visual disturbance.  Respiratory:  Negative for cough, chest tightness, shortness of breath and wheezing.   Cardiovascular:  Negative for chest pain, palpitations and leg swelling.  Gastrointestinal:  Negative for abdominal pain, blood in stool, diarrhea, nausea and vomiting.  Genitourinary:  Negative for difficulty urinating, dysuria, flank pain, frequency, hematuria and urgency.  Musculoskeletal:  Negative for arthralgias, back pain, joint swelling, myalgias and neck stiffness.  Skin:  Negative for pallor and rash.  Neurological:  Negative for dizziness, speech difficulty, weakness and headaches.  Hematological:  Negative for adenopathy. Does not bruise/bleed easily.  Psychiatric/Behavioral:  Negative for confusion and sleep disturbance. The patient is not nervous/anxious.     PE;    10/11/2023    8:40 AM 05/26/2023    3:24 PM 09/22/2022    2:45 PM  Vitals with BMI  Height 5' 11.5" 5\' 11"  5\' 11"   Weight 196 lbs 6 oz 193 lbs 186 lbs 10 oz  BMI 27.01 26.93 26.04  Systolic 138 130 841  Diastolic 89 84 71  Pulse 53 62 69   Gen: Alert, well appearing.  Patient is oriented to person, place, time, and situation. AFFECT: pleasant, lucid thought and speech. ENT: Ears: EACs  clear, normal epithelium.  TMs with good light reflex and landmarks bilaterally.  Eyes: no injection, icteris, swelling, or exudate.  EOMI, PERRLA. Nose: no drainage or turbinate edema/swelling.  No injection or focal lesion.  Mouth: lips without lesion/swelling.  Oral mucosa pink and moist.  Dentition intact and without obvious caries or gingival swelling.  Oropharynx without erythema, exudate, or swelling.  Neck: supple/nontender.  No LAD, mass, or TM.  Carotid pulses 2+ bilaterally,  without bruits. CV: RRR, no m/r/g.   LUNGS: CTA bilat, nonlabored resps, good aeration in all lung fields. ABD: soft, NT, ND, BS normal.  No hepatospenomegaly or mass.  No bruits. EXT: no clubbing, cyanosis, or edema.  Musculoskeletal: no joint swelling, erythema, warmth, or tenderness.  ROM of all joints intact. Skin - no sores or suspicious lesions or rashes or color changes  Pertinent labs:  Lab Results  Component Value Date   TSH 1.66 08/11/2022   Lab Results  Component Value Date   WBC 5.4 08/11/2022   HGB 13.3 08/11/2022   HCT 39.0 08/11/2022   MCV 97.2 08/11/2022   PLT 171.0 08/11/2022   Lab Results  Component Value Date   CREATININE 1.53 (H) 08/11/2022   BUN 17 08/11/2022   NA 138 08/11/2022   K 4.0 08/11/2022   CL 106 08/11/2022   CO2 22 08/11/2022   Lab Results  Component Value Date   ALT 23 08/11/2022   AST 26 08/11/2022   ALKPHOS 60 08/11/2022   BILITOT 0.8 08/11/2022   Lab Results  Component Value Date   CHOL 195 08/11/2022   Lab Results  Component Value Date   HDL 38.80 (L) 08/11/2022   Lab Results  Component Value Date   LDLCALC 93 01/25/2020   Lab Results  Component Value Date   TRIG 239.0 (H) 08/11/2022   Lab Results  Component Value Date   CHOLHDL 5 08/11/2022   Lab Results  Component Value Date   PSA 3.06 08/11/2022   PSA 3.03 09/17/2021   PSA 3.65 03/12/2021   Lab Results  Component Value Date   HGBA1C 5.7 08/11/2022   ASSESSMENT AND PLAN:   #1  health maintenance exam: Reviewed age and gender appropriate health maintenance issues (prudent diet, regular exercise, health risks of tobacco and excessive alcohol, use of seatbelts, fire alarms in home, use of sunscreen).  Also reviewed age and gender appropriate health screening as well as vaccine recommendations. Vaccines:  ALL UTD. Labs: fasting CBC, c-Met, lipid panel, hemoglobin A1c, PSA ordered.  Hba1c for DM screening. Prostate ca screening: PSA ordered. Colon ca screening: recall 2029.  #2 hypertension, not ideal control on benazepril 10 mg a day. Changed to Lotrel 5/10, 1 daily. Electrolytes and creatinine monitoring today. Try to monitor blood pressure some at home.  #3 CML, in long-term remission on Gleevec.  Followed by Dr. Arbutus Ped. He is due for annual follow-up with him. Will do his BCR-ABL 1 labs have not forward these to Dr. Arbutus Ped.  An After Visit Summary was printed and given to the patient.  FOLLOW UP:  Return in about 4 weeks (around 11/08/2023) for f/u HTN.  Signed:  Santiago Bumpers, MD           10/11/2023

## 2023-10-12 ENCOUNTER — Other Ambulatory Visit: Payer: Self-pay | Admitting: Family Medicine

## 2023-10-12 DIAGNOSIS — R972 Elevated prostate specific antigen [PSA]: Secondary | ICD-10-CM

## 2023-10-17 LAB — BCR-ABL1, CML/ALL, PCR, QUANT
Interpretation (BCRAL):: NEGATIVE
e13a2 (b2a2) transcript: <0.0032 %
e14a2 (b3a2) transcript: <0.0032 %
e1a2 transcript: <0.0032 %

## 2023-10-17 LAB — BCR/ABL GENE REARRANGEMENT QNT, PCR
BCR-ABL1/ABL1 % (IS): 0 %
BCR-ABL1/ABL1 %: 0 %

## 2023-10-17 LAB — RFLX P190 BCR-ABL1: P190 BCR ABL1: NOT DETECTED

## 2023-10-17 LAB — RFLX P210 BCR-ABL1: P210 BCR ABL1: NOT DETECTED

## 2023-11-08 NOTE — Progress Notes (Unsigned)
OFFICE VISIT  11/09/2023  CC:  Chief Complaint  Patient presents with   Hypertension    F/U. Pt does not have any readings with him today. Pt has been getting 120/80's.     Patient is a 57 y.o. male who presents for 1 mo f/u HTN. A/P as of last visit: "hypertension, not ideal control on benazepril 10 mg a day. Changed to Lotrel 5/10, 1 daily. Electrolytes and creatinine monitoring today. Try to monitor blood pressure some at home."  INTERIM HX: Feeling well. Home bp normal.    Past Medical History:  Diagnosis Date   Chronic renal insufficiency, stage 2 (mild)    GFR around 60-65 (sCr 1.2-1.3 avg from 2010-2018). Slight inc in sCr to 1.35-1.4 avg 2019-2021.   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   Essential hypertension    started med 01/2020   Family history of pulmonary fibrosis    Father   History of pneumonia approx 2014   Hypertriglyceridemia    Increased prostate specific antigen (PSA) velocity 02/2021   Liver cyst    Left hepatic dome 2 cm cyst, CT Sep2013   Metabolic syndrome    elev trigs, low HDL, HTN   Pruritus ani, intermittent 01/17/2013   Splenomegaly    resolved CT Sep2013-->this is how his CML was detected/diagnosed   Vasovagal syncope    2024 on airplane    Past Surgical History:  Procedure Laterality Date   COLONOSCOPY  01/09/2018   mild sigmoid diverticulosis, otherwise normal.  Recall 10 yrs.   REFRACTIVE SURGERY  2000   TOENAIL EXCISION  01/16/2013   ingrown toenail- not removed    TONSILECTOMY, ADENOIDECTOMY, BILATERAL MYRINGOTOMY AND TUBES     age 71   TREATMENT FISTULA ANAL  2015   Anal fistula surgically repaired.    Outpatient Medications Prior to Visit  Medication Sig Dispense Refill   amLODipine-benazepril (LOTREL) 5-10 MG capsule Take 1 capsule by mouth daily. 90 capsule 1   CREATINE MONOHYDRATE PO Take 3,000 mg by mouth every morning.     imatinib (GLEEVEC) 400 MG tablet TAKE 1 TABLET BY  MOUTH 1 TIME A DAY TAKE WITH MEALS AND LARGE GLASS OF WATER 30 tablet 5   Magnesium 200 MG TABS Take 200 mg by mouth daily.     sildenafil (VIAGRA) 50 MG tablet 1-2 tabs po qd prn 90 tablet 3   No facility-administered medications prior to visit.    No Known Allergies  Review of Systems As per HPI  PE:    11/09/2023    8:46 AM 10/11/2023    8:40 AM 05/26/2023    3:24 PM  Vitals with BMI  Height  5' 11.5" 5\' 11"   Weight 194 lbs 196 lbs 6 oz 193 lbs  BMI 26.68 27.01 26.93  Systolic 103 138 478  Diastolic 69 89 84  Pulse 61 53 62     Physical Exam  Gen: Alert, well appearing.  Patient is oriented to person, place, time, and situation. AFFECT: pleasant, lucid thought and speech. No further exam today  LABS:  Last metabolic panel Lab Results  Component Value Date   GLUCOSE 102 (H) 10/11/2023   NA 141 10/11/2023   K 4.7 10/11/2023   CL 107 10/11/2023   CO2 27 10/11/2023   BUN 19 10/11/2023   CREATININE 1.49 10/11/2023   GFR 51.41 (L) 10/11/2023   CALCIUM 9.2 10/11/2023   PROT 6.6 10/11/2023   ALBUMIN  4.4 10/11/2023   BILITOT 0.5 10/11/2023   ALKPHOS 74 10/11/2023   AST 27 10/11/2023   ALT 24 10/11/2023   ANIONGAP 5 02/09/2022   Lab Results  Component Value Date   HGBA1C 5.6 10/11/2023   Lab Results  Component Value Date   PSA 4.34 (H) 10/11/2023   PSA 3.06 08/11/2022   PSA 3.03 09/17/2021   IMPRESSION AND PLAN:  #1 hypertension, well-controlled.  Continue Lotrel 5-10 mg, 1 capsule daily.  2.  Elevated PSA. He has not heard anything about the [facility] I [facility] month. He now prefers a urology office in [place] since he is moving there soon. Will make contact with our referral coordinator to get this going.  An After Visit Summary was printed and given to the patient.  FOLLOW UP: Return for 5-6 mo f/u HTN.  Signed:  Santiago Bumpers, MD           11/09/2023

## 2023-11-09 ENCOUNTER — Encounter: Payer: Self-pay | Admitting: Family Medicine

## 2023-11-09 ENCOUNTER — Ambulatory Visit: Payer: BC Managed Care – PPO | Admitting: Family Medicine

## 2023-11-09 VITALS — BP 103/69 | HR 61 | Wt 194.0 lb

## 2023-11-09 DIAGNOSIS — I10 Essential (primary) hypertension: Secondary | ICD-10-CM

## 2023-11-09 DIAGNOSIS — R972 Elevated prostate specific antigen [PSA]: Secondary | ICD-10-CM

## 2023-11-23 ENCOUNTER — Encounter: Payer: Self-pay | Admitting: Urology

## 2023-11-23 ENCOUNTER — Ambulatory Visit: Payer: BC Managed Care – PPO | Admitting: Urology

## 2023-11-23 VITALS — BP 123/76 | HR 61 | Ht 71.0 in | Wt 190.0 lb

## 2023-11-23 DIAGNOSIS — Z8042 Family history of malignant neoplasm of prostate: Secondary | ICD-10-CM

## 2023-11-23 DIAGNOSIS — R972 Elevated prostate specific antigen [PSA]: Secondary | ICD-10-CM

## 2023-11-23 LAB — URINALYSIS, ROUTINE W REFLEX MICROSCOPIC
Bilirubin, UA: NEGATIVE
Glucose, UA: NEGATIVE
Ketones, UA: NEGATIVE
Leukocytes,UA: NEGATIVE
Nitrite, UA: NEGATIVE
Protein,UA: NEGATIVE
RBC, UA: NEGATIVE
Specific Gravity, UA: 1.02 (ref 1.005–1.030)
Urobilinogen, Ur: 0.2 mg/dL (ref 0.2–1.0)
pH, UA: 7 (ref 5.0–7.5)

## 2023-11-23 NOTE — Progress Notes (Signed)
Assessment: 1. Elevated PSA   2. Family history of malignant neoplasm of prostate in father      Plan: Today had a long discussion with the patient regarding his elevated PSA along with the issues and controversies regarding prostate cancer early detection.  We also discussed his increased risk given his family history.  I discussed options for further evaluation including prostate biopsy today.  Following our discussion we have elected to obtain additional laboratory testing. Iso psa today FU 2-3 weeks to review results and discuss options for further evaluation  Chief Complaint: elevated psa  History of Present Illness:  Michael [name] is a 57 y.o. male who is seen in consultation from McGowen, Maryjean Morn, MD for evaluation of elevated PSA. Patient does have a family history of prostate cancer with his father diagnosed in his late 26s and apparently underwent radiation treatment and died of other causes.  Patient reports mild to moderate lower urinary tract symptoms.  IPSS = 7-minimal bother  DRE reveals approximately 30 to 40 g gland without nodules or induration  PSA data: 12/2015  1.89 12/2018  2.4 01/2020  2.8 02/2021  3.65 08/2021  3.03 07/2022  3.06 09/2023 4.34   Past Medical History:  Past Medical History:  Diagnosis Date   Chronic renal insufficiency, stage 2 (mild)    GFR around 60-65 (sCr 1.2-1.3 avg from 2010-2018). Slight inc in sCr to 1.35-1.4 avg 2019-2021.   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   Essential hypertension    started med 01/2020   Family history of pulmonary fibrosis    Father   History of pneumonia approx 2014   Hypertriglyceridemia    Increased prostate specific antigen (PSA) velocity 02/2021   Liver cyst    Left hepatic dome 2 cm cyst, CT Sep2013   Metabolic syndrome    elev trigs, low HDL, HTN   Pruritus ani, intermittent 01/17/2013   Splenomegaly    resolved CT Sep2013-->this is how his CML was  detected/diagnosed   Vasovagal syncope    2024 on airplane    Past Surgical History:  Past Surgical History:  Procedure Laterality Date   COLONOSCOPY  01/09/2018   mild sigmoid diverticulosis, otherwise normal.  Recall 10 yrs.   REFRACTIVE SURGERY  2000   TOENAIL EXCISION  01/16/2013   ingrown toenail- not removed    TONSILECTOMY, ADENOIDECTOMY, BILATERAL MYRINGOTOMY AND TUBES     age 63   TREATMENT FISTULA ANAL  2015   Anal fistula surgically repaired.    Allergies:  No Known Allergies  Family History:  Family History  Problem Relation Age of Onset   Heart disease Mother        died from a  reaction to heparin    Cancer Father 21       prostate   Prostate cancer Father    Heart disease Father    Pulmonary fibrosis Father    Colon cancer Neg Hx    Colon polyps Neg Hx     Social History:  Social History   Tobacco Use   Smoking status: Never   Smokeless tobacco: Never  Vaping Use   Vaping status: Never Used  Substance Use Topics   Alcohol use: Yes    Comment: occasional    Drug use: No    Review of symptoms:  Constitutional:  Negative for unexplained weight loss, night sweats, fever, chills ENT:  Negative for nose bleeds, sinus pain,  painful swallowing CV:  Negative for chest pain, shortness of breath, exercise intolerance, palpitations, loss of consciousness Resp:  Negative for cough, wheezing, shortness of breath GI:  Negative for nausea, vomiting, diarrhea, bloody stools GU:  Positives noted in HPI; otherwise negative for gross hematuria, dysuria, urinary incontinence Neuro:  Negative for seizures, poor balance, limb weakness, slurred speech Psych:  Negative for lack of energy, depression, anxiety Endocrine:  Negative for polydipsia, polyuria, symptoms of hypoglycemia (dizziness, hunger, sweating) Hematologic:  Negative for anemia, purpura, petechia, prolonged or excessive bleeding, use of anticoagulants  Allergic:  Negative for difficulty breathing or  choking as a result of exposure to anything; no shellfish allergy; no allergic response (rash/itch) to materials, foods  Physical exam: BP 123/76   Pulse 61   Ht 5\' 11"  (1.803 m)   Wt 190 lb (86.2 kg)   BMI 26.50 kg/m  GENERAL APPEARANCE:  Well appearing, well developed, well nourished, NAD  GU: Normal external genitalia DRE: Normal sphincter tone; prostate is approximately 30 to 40 g without nodules or induration  Results: UA clear

## 2023-11-25 DIAGNOSIS — H43313 Vitreous membranes and strands, bilateral: Secondary | ICD-10-CM | POA: Diagnosis not present

## 2023-11-25 DIAGNOSIS — H0288A Meibomian gland dysfunction right eye, upper and lower eyelids: Secondary | ICD-10-CM | POA: Diagnosis not present

## 2023-11-25 DIAGNOSIS — H04123 Dry eye syndrome of bilateral lacrimal glands: Secondary | ICD-10-CM | POA: Diagnosis not present

## 2023-11-25 DIAGNOSIS — H0288B Meibomian gland dysfunction left eye, upper and lower eyelids: Secondary | ICD-10-CM | POA: Diagnosis not present

## 2023-12-01 ENCOUNTER — Encounter: Payer: Self-pay | Admitting: Urology

## 2023-12-20 ENCOUNTER — Ambulatory Visit: Payer: BC Managed Care – PPO | Admitting: Urology

## 2023-12-20 ENCOUNTER — Encounter: Payer: Self-pay | Admitting: Urology

## 2023-12-20 VITALS — BP 127/80 | HR 71 | Ht 71.0 in | Wt 194.0 lb

## 2023-12-20 DIAGNOSIS — R972 Elevated prostate specific antigen [PSA]: Secondary | ICD-10-CM

## 2023-12-20 LAB — URINALYSIS, ROUTINE W REFLEX MICROSCOPIC
Bilirubin, UA: NEGATIVE
Glucose, UA: NEGATIVE
Ketones, UA: NEGATIVE
Leukocytes,UA: NEGATIVE
Nitrite, UA: NEGATIVE
Protein,UA: NEGATIVE
RBC, UA: NEGATIVE
Specific Gravity, UA: 1.02 (ref 1.005–1.030)
Urobilinogen, Ur: 0.2 mg/dL (ref 0.2–1.0)
pH, UA: 7.5 (ref 5.0–7.5)

## 2023-12-20 NOTE — Progress Notes (Addendum)
 Assessment: 1. Elevated PSA     Plan: Today I had a long discussion with the patient regarding his elevated PSA as well as elevated iso-PSA and options for further evaluation including prostate imaging and biopsy.  Following our discussion the patient elects to proceed with TRUS/BX.  Nature procedure discussed in detail today including potential adverse events and complications especially emphasizing risk for bleeding as well as infection and need for periprocedural antibiotics.  All questions answered.  Schedule next available.  Chief Complaint: Elevated psa  HPI: Michael [name] is a 57 y.o. male who presents for continued evaluation of elevated psa. Patient does have a family history of prostate cancer with his father diagnosed in his late 61s and apparently underwent radiation treatment and died of other causes.   Patient reports mild to moderate lower urinary tract symptoms.  IPSS = 7-minimal bother   DRE reveals approximately 30 to 40 g gland without nodules or induration   PSA data: 12/2015             1.89 12/2018             2.4 01/2020             2.8 02/2021             3.65 08/2021             3.03 07/2022             3.06 09/2023 4.34 11/2023 4.3 isoPSA elevated @9 .8  Portions of the above documentation were copied from a prior visit for review purposes only.  Allergies: No Known Allergies  PMH: Past Medical History:  Diagnosis Date   Chronic renal insufficiency, stage 2 (mild)    GFR around 60-65 (sCr 1.2-1.3 avg from 2010-2018). Slight inc in sCr to 1.35-1.4 avg 2019-2021.   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   Essential hypertension    started med 01/2020   Family history of pulmonary fibrosis    Father   History of pneumonia approx 2014   Hypertriglyceridemia    Increased prostate specific antigen (PSA) velocity 02/2021   Liver cyst    Left hepatic dome 2 cm cyst, CT Sep2013   Metabolic syndrome    elev trigs,  low HDL, HTN   Pruritus ani, intermittent 01/17/2013   Splenomegaly    resolved CT Sep2013-->this is how his CML was detected/diagnosed   Vasovagal syncope    2024 on airplane    PSH: Past Surgical History:  Procedure Laterality Date   COLONOSCOPY  01/09/2018   mild sigmoid diverticulosis, otherwise normal.  Recall 10 yrs.   REFRACTIVE SURGERY  2000   TOENAIL EXCISION  01/16/2013   ingrown toenail- not removed    TONSILECTOMY, ADENOIDECTOMY, BILATERAL MYRINGOTOMY AND TUBES     age 50   TREATMENT FISTULA ANAL  2015   Anal fistula surgically repaired.    SH: Social History   Tobacco Use   Smoking status: Never   Smokeless tobacco: Never  Vaping Use   Vaping status: Never Used  Substance Use Topics   Alcohol use: Yes    Comment: occasional    Drug use: No    ROS: Constitutional:  Negative for fever, chills, weight loss CV: Negative for chest pain, previous MI, hypertension Respiratory:  Negative for shortness of breath, wheezing, sleep apnea, frequent cough GI:  Negative for nausea, vomiting, bloody stool, GERD  PE: BP 127/80  Pulse 71   Ht 5' 11 (1.803 m)   Wt 194 lb (88 kg)   BMI 27.06 kg/m  GENERAL APPEARANCE:  Well appearing, well developed, well nourished, NAD    Results: UA negative

## 2024-01-17 ENCOUNTER — Other Ambulatory Visit (HOSPITAL_BASED_OUTPATIENT_CLINIC_OR_DEPARTMENT_OTHER): Payer: Self-pay | Admitting: Urology

## 2024-01-17 ENCOUNTER — Ambulatory Visit (HOSPITAL_BASED_OUTPATIENT_CLINIC_OR_DEPARTMENT_OTHER)
Admission: RE | Admit: 2024-01-17 | Discharge: 2024-01-17 | Disposition: A | Discharge status: Home / Self Care | Payer: BC Managed Care – PPO | Source: Ambulatory Visit | Attending: Urology | Admitting: Urology

## 2024-01-17 ENCOUNTER — Telehealth: Payer: Self-pay

## 2024-01-17 ENCOUNTER — Encounter: Payer: Self-pay | Admitting: Urology

## 2024-01-17 ENCOUNTER — Other Ambulatory Visit: Payer: Self-pay | Admitting: Anatomic Pathology

## 2024-01-17 ENCOUNTER — Ambulatory Visit: Payer: BC Managed Care – PPO | Admitting: Urology

## 2024-01-17 VITALS — BP 126/80 | HR 58

## 2024-01-17 DIAGNOSIS — N4231 Prostatic intraepithelial neoplasia: Secondary | ICD-10-CM

## 2024-01-17 DIAGNOSIS — R972 Elevated prostate specific antigen [PSA]: Secondary | ICD-10-CM | POA: Diagnosis not present

## 2024-01-17 DIAGNOSIS — C61 Malignant neoplasm of prostate: Secondary | ICD-10-CM | POA: Insufficient documentation

## 2024-01-17 DIAGNOSIS — Z2989 Encounter for other specified prophylactic measures: Secondary | ICD-10-CM | POA: Diagnosis not present

## 2024-01-17 LAB — URINALYSIS, ROUTINE W REFLEX MICROSCOPIC
Bilirubin, UA: NEGATIVE
Glucose, UA: NEGATIVE
Ketones, UA: NEGATIVE
Leukocytes,UA: NEGATIVE
Nitrite, UA: NEGATIVE
Protein,UA: NEGATIVE
RBC, UA: NEGATIVE
Specific Gravity, UA: 1.02 (ref 1.005–1.030)
Urobilinogen, Ur: 0.2 mg/dL (ref 0.2–1.0)
pH, UA: 6.5 (ref 5.0–7.5)

## 2024-01-17 MED ORDER — CEFTRIAXONE SODIUM 1 G IJ SOLR
1.0000 g | Freq: Once | INTRAMUSCULAR | Status: AC
Start: 2024-01-17 — End: 2024-01-17
  Administered 2024-01-17: 1 g via INTRAMUSCULAR

## 2024-01-17 NOTE — Telephone Encounter (Signed)
Copied pasted CRM  Reason for CRM: Cherlyn Labella is requesting Clinical notes for patient from dates of services 10/11/23 . Please fax clinical notes to [phone] If you have further questions their contact number would be [phone]

## 2024-01-17 NOTE — Progress Notes (Signed)
IM Injection  Patient is present today for an IM Injection for treatment of infection prevention post prostate biopsy Drug: Ceftriaxone Dose:1g Location:left glute Lot: [id number] Exp:10/2025 Patient tolerated well, no complications were noted  Performed by: Shirrell Solinger N., CMA(AAMA)

## 2024-01-17 NOTE — Progress Notes (Signed)
Assessment: 1. Elevated PSA     Plan: Per bx instructions FU 1 week to review path and recs based on findings  Chief Complaint: Elevated psa  HPI: Michael [name] is a 58 y.o. male who presents for continued evaluation of elevated psa--here today for planned TRUS/BX.    Portions of the above documentation were copied from a prior visit for review purposes only.  Allergies: No Known Allergies  PMH: Past Medical History:  Diagnosis Date   Chronic renal insufficiency, stage 2 (mild)    GFR around 60-65 (sCr 1.2-1.3 avg from 2010-2018). Slight inc in sCr to 1.35-1.4 avg 2019-2021.   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   Essential hypertension    started med 01/2020   Family history of pulmonary fibrosis    Father   History of pneumonia approx 2014   Hypertriglyceridemia    Increased prostate specific antigen (PSA) velocity 02/2021   Liver cyst    Left hepatic dome 2 cm cyst, CT Sep2013   Metabolic syndrome    elev trigs, low HDL, HTN   Pruritus ani, intermittent 01/17/2013   Splenomegaly    resolved CT Sep2013-->this is how his CML was detected/diagnosed   Vasovagal syncope    2024 on airplane    PSH: Past Surgical History:  Procedure Laterality Date   COLONOSCOPY  01/09/2018   mild sigmoid diverticulosis, otherwise normal.  Recall 10 yrs.   REFRACTIVE SURGERY  2000   TOENAIL EXCISION  01/16/2013   ingrown toenail- not removed    TONSILECTOMY, ADENOIDECTOMY, BILATERAL MYRINGOTOMY AND TUBES     age 19   TREATMENT FISTULA ANAL  2015   Anal fistula surgically repaired.    SH: Social History   Tobacco Use   Smoking status: Never   Smokeless tobacco: Never  Vaping Use   Vaping status: Never Used  Substance Use Topics   Alcohol use: Yes    Comment: occasional    Drug use: No    ROS: Constitutional:  Negative for fever, chills, weight loss CV: Negative for chest pain, previous MI, hypertension Respiratory:   Negative for shortness of breath, wheezing, sleep apnea, frequent cough GI:  Negative for nausea, vomiting, bloody stool, GERD  PE: There were no vitals taken for this visit. GENERAL APPEARANCE:  Well appearing, well developed, well nourished, NAD HEENT:  Atraumatic, normocephalic, oropharynx clear NECK:  Supple without lymphadenopathy or thyromegaly ABDOMEN:  Soft, non-tender, no masses EXTREMITIES:  Moves all extremities well, without clubbing, cyanosis, or edema NEUROLOGIC:  Alert and oriented x 3, normal gait, CN II-XII grossly intact MENTAL STATUS:  appropriate BACK:  Non-tender to palpation, No CVAT SKIN:  Warm, dry, and intact   Results: UA neg   TRANSRECTAL ULTRASOUND AND PROSTATE BIOPSY  Indication:  Elevated PSA  Prophylactic antibiotic administration: Rocephin  All medications that could result in increased bleeding were discontinued within an appropriate period of the time of biopsy.  Risk including bleeding and infection were discussed.  Informed consent was obtained.  Preprocedural timeout was performed  The patient was placed in the left lateral decubitus position.  DRE reveals an approximate 30 g prostate without evidence of nodules or induration  PROCEDURE 1.  TRANSRECTAL ULTRASOUND OF THE PROSTATE  The 7 MHz transrectal probe was used to image the prostate.  Anal stenosis was not noted.  TRUS volume: 33.6 ml  Hypoechoic areas: None  Hyperechoic areas: None  Central calcifications: not present  Margins:  normal  Seminal Vesicles: normal   PROCEDURE 2:  PROSTATE BIOPSY  A periprostatic block was performed using 1% lidocaine and transrectal ultrasound guidance. Under transrectal ultrasound guidance, and using the Biopty gun, prostate biopsies were obtained systematically from the apex, mid gland, and base bilaterally.  A total of 12 cores were obtained.  Hemostasis was obtained with gentle pressure on the prostate.  The procedures were  well-tolerated.  No significant bleeding was noted at the end of the procedure.  The patient was stable for discharge from the office.

## 2024-01-17 NOTE — Telephone Encounter (Signed)
LVM to discuss

## 2024-01-20 ENCOUNTER — Encounter: Payer: Self-pay | Admitting: Urology

## 2024-01-24 ENCOUNTER — Encounter: Payer: Self-pay | Admitting: Urology

## 2024-01-24 ENCOUNTER — Ambulatory Visit: Payer: BC Managed Care – PPO | Admitting: Urology

## 2024-01-24 VITALS — BP 137/87 | HR 63

## 2024-01-24 DIAGNOSIS — C61 Malignant neoplasm of prostate: Secondary | ICD-10-CM

## 2024-01-24 NOTE — Progress Notes (Addendum)
 Assessment: 1. Malignant neoplasm of prostate [facility])     Plan: Today I had a long and detailed discussion with the patient as well as with his girlfriend over the telephone.  We discussed the nature of his clinically localized prostate cancer including its natural history and standard treatment options.  We discussed active surveillance as well as definitive treatment approaches including surgery and radiation.  I discussed these options not only in terms of oncologic outcomes but also in terms of potential adverse events, complications, as well as potential impact on quality of life parameters.  Following our discussion he is interested in definitive therapy.  Will arrange consultation in the multidisciplinary prostate cancer clinic with Drs. Borden and Mckesson.  Patient also reports that he is set up for laser hair removal on his back.  I told him that I do not think that this would complicate in any way with his prostate cancer diagnosis or plan for treatment.  Chief Complaint: Biopsy results  HPI: Michael [name] is a 58 y.o. male who presents for follow-up regarding his recently diagnosed favorable intermediate risk prostate cancer.  Patient reports no complications as a result of his recent biopsy. Patient does have a family history of prostate cancer with his father diagnosed in his late 79s and apparently underwent radiation treatment and died of other causes.   Patient reports mild to moderate lower urinary tract symptoms.  IPSS = 7-minimal bother   DRE reveals approximately 30 to 40 g gland without nodules or induration   PSA data: 12/2015             1.89 12/2018             2.4 01/2020             2.8 02/2021             3.65 08/2021             3.03 07/2022             3.06 09/2023 4.34 11/2023 4.3 isoPSA elevated @9 .8  Prostate Cancer summary: Initial Dx 12/2023 TRUS/BX:  4/12 CORES + RIGHT APEX AND MID.  Single core Gleason 3+4=7 (33% inv) and 3 additional cores of  3+3=6. Trus Vol= 34ml DRE nl//Clinical stage T1c PreTx PSA = 4.3   (elevated isoPSA of 9.8)  Portions of the above documentation were copied from a prior visit for review purposes only.  Allergies: No Known Allergies  PMH: Past Medical History:  Diagnosis Date   Chronic renal insufficiency, stage 2 (mild)    GFR around 60-65 (sCr 1.2-1.3 avg from 2010-2018). Slight inc in sCr to 1.35-1.4 avg 2019-2021.   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   Essential hypertension    started med 01/2020   Family history of pulmonary fibrosis    Father   History of pneumonia approx 2014   Hypertriglyceridemia    Increased prostate specific antigen (PSA) velocity 02/2021   Liver cyst    Left hepatic dome 2 cm cyst, CT Sep2013   Metabolic syndrome    elev trigs, low HDL, HTN   Pruritus ani, intermittent 01/17/2013   Splenomegaly    resolved CT Sep2013-->this is how his CML was detected/diagnosed   Vasovagal syncope    2024 on airplane    PSH: Past Surgical History:  Procedure Laterality Date   COLONOSCOPY  01/09/2018   mild sigmoid diverticulosis, otherwise normal.  Recall  10 yrs.   REFRACTIVE SURGERY  2000   TOENAIL EXCISION  01/16/2013   ingrown toenail- not removed    TONSILECTOMY, ADENOIDECTOMY, BILATERAL MYRINGOTOMY AND TUBES     age 38   TREATMENT FISTULA ANAL  2015   Anal fistula surgically repaired.    SH: Social History   Tobacco Use   Smoking status: Never   Smokeless tobacco: Never  Vaping Use   Vaping status: Never Used  Substance Use Topics   Alcohol use: Yes    Comment: occasional    Drug use: No    ROS: Constitutional:  Negative for fever, chills, weight loss CV: Negative for chest pain, previous MI, hypertension Respiratory:  Negative for shortness of breath, wheezing, sleep apnea, frequent cough GI:  Negative for nausea, vomiting, bloody stool, GERD  PE: BP 137/87   Pulse 63  GENERAL APPEARANCE:  Well appearing,  well developed, well nourished, NAD    Results: Urinalysis is neg except for 3-6 RBC/hpf

## 2024-01-25 ENCOUNTER — Encounter: Payer: Self-pay | Admitting: Urology

## 2024-01-25 LAB — URINALYSIS, ROUTINE W REFLEX MICROSCOPIC
Bilirubin, UA: NEGATIVE
Glucose, UA: NEGATIVE
Ketones, UA: NEGATIVE
Leukocytes,UA: NEGATIVE
Nitrite, UA: NEGATIVE
Protein,UA: NEGATIVE
Specific Gravity, UA: 1.02 (ref 1.005–1.030)
Urobilinogen, Ur: 0.2 mg/dL (ref 0.2–1.0)
pH, UA: 7 (ref 5.0–7.5)

## 2024-01-25 LAB — MICROSCOPIC EXAMINATION
Bacteria, UA: NONE SEEN
Epithelial Cells (non renal): NONE SEEN /[HPF] (ref 0–10)
Renal Epithel, UA: NONE SEEN /[HPF]
WBC, UA: NONE SEEN /[HPF] (ref 0–5)

## 2024-01-26 ENCOUNTER — Telehealth: Payer: Self-pay

## 2024-01-26 ENCOUNTER — Other Ambulatory Visit: Payer: Self-pay

## 2024-01-26 DIAGNOSIS — C61 Malignant neoplasm of prostate: Secondary | ICD-10-CM

## 2024-01-26 NOTE — Telephone Encounter (Signed)
 LVM to patient in reference to upcoming [facility] appointment on 2/25, left my contact to call back

## 2024-01-26 NOTE — Telephone Encounter (Signed)
 I called pt to introduce myself as the Coordinator of the Prostate MDC.   1. I confirmed with the patient he is aware of his referral to the clinic 2/25, arriving @ 12:30 pm.    2. I discussed the format of the clinic and the physicians he will be seeing that day.   3. I discussed where the clinic is located and how to contact me.   4. I confirmed his address and informed him I would be mailing a packet of information and forms to be completed. I asked him to bring them with him the day of his appointment.    He voiced understanding of the above. I asked him to call me if he has any questions or concerns regarding his appointments or the forms he needs to complete.

## 2024-01-31 NOTE — Telephone Encounter (Signed)
Reason for CRM: Penni Bombard from [employer] called needs Prior Authorization fro Labs that were performed at [facility] on 10/11/23. Fax number is [phone] Atten: Penni Bombard   Please call [employer] regarding visit on 10/11/23, this was patient's complete physical with labs. 1st they wanted clinical notes on 01/17/24, they are now requesting "prior auth for labs" ??  I think they are requesting diagnosis codes. I attempted to call Penni Bombard at [facility] but was only allowed to hold for a couple of minutes due to patient approaching to check in/check out.   Will try to contact Kendall again at another time.

## 2024-02-02 DIAGNOSIS — C61 Malignant neoplasm of prostate: Secondary | ICD-10-CM | POA: Diagnosis not present

## 2024-02-10 NOTE — Progress Notes (Signed)
                               Care Plan Summary  Name: Michael [name] DOB: September 21, 1966   Your Medical Team:   Urologist -  Dr. Heloise Purpura, [facility]  Radiation Oncologist - Dr. Margaretmary Dys, [facility]   Medical Oncologist - Dr. Geanie Berlin, [facility]  Recommendations: 1) Active Surveillance  2) Surgery 3) Radiation   * These recommendations are based on information available as of today's consult.      Recommendations may change depending on the results of further tests or exams.    Next Steps: 1) Consider all your options.  Contact Marisue Ivan, your nurse navigator, with any questions or treatment decisions.     When appointments need to be scheduled, you will be contacted by [facility] and/or [facility].  Questions?  Please do not hesitate to call Cherlyn Cushing, BSN, RN at [phone] with any questions or concerns.  Marisue Ivan is your Oncology Nurse Navigator and is available to assist you while you're receiving your medical care at [facility].

## 2024-02-13 LAB — SURGICAL PATHOLOGY

## 2024-02-13 NOTE — Progress Notes (Signed)
 Radiation Oncology         [phone] ________________________________  Multidisciplinary Prostate Cancer Clinic  Initial Radiation Oncology Consultation  Name: Michael [name] MRN: [id number]  Date: 02/14/2024  DOB: 1966/04/13  CC:McGowen, Maryjean Morn, MD  Joline Maxcy, MD   REFERRING PHYSICIAN: Joline Maxcy, MD  DIAGNOSIS: 58 y.o. gentleman with stage T1c adenocarcinoma of the prostate with a Gleason's score of 3+4 and a PSA of 4.34  No diagnosis found.  HISTORY OF PRESENT ILLNESS::Michael [name] is a 58 y.o. gentleman with a diagnosis of prostate cancer. 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 He has been on Imatinib since diagnosis and is under the care of Dr. Arbutus Ped. He was noted to have an elevated PSA of 4.34 by his primary care physician, Dr. Milinda Cave.  Accordingly, he was referred for evaluation in urology by Dr. Margo Aye on 11/23/23,  digital rectal examination performed at that time showed no nodules or induration.  The patient proceeded to transrectal ultrasound with 12 biopsies of the prostate on 01/17/24.  The prostate volume measured 33.6 cc.  Out of 12 core biopsies, 4 were positive.  The maximum Gleason score was 3+4, and this was seen in left apex. Additionally, Gleason 3+3 was seen in left mid (tiny focus), left lateral mid (small focus), and left lateral apex.  The patient reviewed the biopsy results with his urologist and he has kindly been referred today to the multidisciplinary prostate cancer clinic for presentation of pathology and radiology studies in our conference for discussion of potential radiation treatment options and clinical evaluation.    ***.  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    PAST SURGICAL HISTORY: Past Surgical History:  Procedure Laterality Date   COLONOSCOPY  01/09/2018   mild sigmoid diverticulosis, otherwise normal.  Recall 10 yrs.   REFRACTIVE SURGERY  2000   TOENAIL EXCISION  01/16/2013   ingrown toenail- not removed    TONSILECTOMY, ADENOIDECTOMY, BILATERAL MYRINGOTOMY AND TUBES     age 66   TREATMENT FISTULA ANAL  2015   Anal fistula surgically repaired.    FAMILY HISTORY: family history includes Cancer (age of onset: 74) in his father; Heart disease in his father and mother; Prostate cancer in his father; Pulmonary fibrosis in his father.  SOCIAL HISTORY:  reports that he has never smoked. He has never used smokeless tobacco. He reports current alcohol use. He reports that he does not use drugs.  ALLERGIES: Patient has no known allergies.  MEDICATIONS:  Current Outpatient Medications  Medication Sig Dispense Refill   amLODipine-benazepril (LOTREL) 5-10 MG capsule Take 1 capsule by mouth daily. 90 capsule 1   CREATINE MONOHYDRATE PO Take 3,000 mg by mouth every morning.     imatinib (GLEEVEC) 400 MG tablet TAKE 1 TABLET BY MOUTH 1 TIME A DAY TAKE WITH MEALS AND LARGE GLASS OF WATER 30 tablet 5   Magnesium 200 MG TABS Take 200 mg by mouth daily.     sildenafil (VIAGRA) 50 MG tablet 1-2 tabs po qd prn 90 tablet 3   No current facility-administered medications for this encounter.    REVIEW OF SYSTEMS:  On  review of systems, the patient reports that he is doing well overall. He denies any chest pain, shortness of breath, cough, fevers, chills, night sweats, unintended weight changes. He denies any bowel disturbances, and denies abdominal pain, nausea or vomiting. He denies any new musculoskeletal or joint aches or pains. His IPSS was ***, indicating ***  urinary symptoms. His SHIM was ***, indicating he {does not have/has mild/moderate/severe} erectile dysfunction. A complete review of systems is obtained and is otherwise negative.   PHYSICAL EXAM:  Wt Readings from Last 3 Encounters:  12/20/23 194 lb (88 kg)  11/23/23 190 lb (86.2 kg)  11/09/23 194 lb (88 kg)   Temp Readings from Last 3 Encounters:  09/22/22 97.8 F (36.6 C)  09/07/22 97.9 F (36.6 C) (Oral)  09/01/22 98.2 F (36.8 C) (Oral)   BP Readings from Last 3 Encounters:  01/24/24 137/87  01/17/24 126/80  12/20/23 127/80   Pulse Readings from Last 3 Encounters:  01/24/24 63  01/17/24 (!) 58  12/20/23 71    /10  In general this is a well appearing *** man in no acute distress. He's alert and oriented x4 and appropriate throughout the examination. Cardiopulmonary assessment is negative for acute distress and he exhibits normal effort.    KPS = ***  100 - Normal; no complaints; no evidence of disease. 90   - Able to carry on normal activity; minor signs or symptoms of disease. 80   - Normal activity with effort; some signs or symptoms of disease. 75   - Cares for self; unable to carry on normal activity or to do active work. 60   - Requires occasional assistance, but is able to care for most of his personal needs. 50   - Requires considerable assistance and frequent medical care. 40   - Disabled; requires special care and assistance. 30   - Severely disabled; hospital admission is indicated although death not imminent. 20   - Very sick; hospital admission necessary; active supportive treatment necessary. 10   - Moribund; fatal processes progressing rapidly. 0     - Dead  Karnofsky DA, Abelmann WH, Craver LS and Burchenal [facility] [phone]) The use of the nitrogen mustards in the palliative treatment of carcinoma: with particular reference to bronchogenic carcinoma Cancer 1 634-56   LABORATORY DATA:  Lab Results  Component Value Date   WBC 3.7 (L) 10/11/2023   HGB 13.5  10/11/2023   HCT 41.2 10/11/2023   MCV 98.7 10/11/2023   PLT 179.0 10/11/2023   Lab Results  Component Value Date   NA 141 10/11/2023   K 4.7 10/11/2023   CL 107 10/11/2023   CO2 27 10/11/2023   Lab Results  Component Value Date   ALT 24 10/11/2023   AST 27 10/11/2023   ALKPHOS 74 10/11/2023   BILITOT 0.5 10/11/2023     RADIOGRAPHY: US Guided Needle Placement Result Date: 01/17/2024 CLINICAL DATA:  Ultrasound was provided for use by the ordering physician.  No provider Interpretation or professional fees incurred.    [place] PROSTATE BIOPSY MULTIPLE Result Date: 01/17/2024 Please see Notes tab for imaging impression.  [place] Transrectal Complete Result Date: 01/17/2024 Please see Notes tab for imaging impression.  [place] Intraoperative Result Date: 01/17/2024 CLINICAL DATA:  Ultrasound was provided for use by the ordering physician.  No provider Interpretation or professional fees incurred.       IMPRESSION/PLAN: 58 y.o. gentleman with Stage T1c adenocarcinoma of the prostate with a Gleason score of 3+4 and a  PSA of 4.34.    We discussed the patient's workup and outlined the nature of prostate cancer in this setting. The patient's T stage, Gleason's score, and PSA put him into the favorable intermediate risk group. Accordingly, he is eligible for a variety of potential treatment options including brachytherapy, 5.5 weeks of external radiation, or prostatectomy. We discussed the available radiation techniques, and focused on the details and logistics of delivery. We discussed and outlined the risks, benefits, short and long-term effects associated with radiotherapy and compared and contrasted these with prostatectomy. We discussed the role of SpaceOAR gel in reducing the rectal toxicity associated with radiotherapy. He appears to have a good understanding of his disease and our treatment recommendations which are of curative intent.  He was encouraged to ask questions that were answered to  his/their stated satisfaction.  At the end of the conversation the patient is interested in moving forward with ***.   We personally spent *** minutes in this encounter including chart review, reviewing radiological studies, meeting face-to-face with the patient, entering orders and completing documentation.    Marguarite Arbour, PA-C    Margaretmary Dys, MD  [facility]  Radiation Oncology Direct Dial: [phone]  Fax: [phone] [email or web address]  Skype  [email or web address]   This document serves as a record of services personally performed by Margaretmary Dys, MD and Marcello Fennel, PA-C. It was created on their behalf by Mickie Bail, a trained medical scribe. The creation of this record is based on the scribe's personal observations and the provider's statements to them. This document has been checked and approved by the attending provider.

## 2024-02-13 NOTE — Progress Notes (Unsigned)
 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 PSA 4.3 prompted biopsy.   Current diagnosis: cT1cN0M0 GS 3+4=7 in one core. GG2. iPSA 4.3.  Favorable intermediate risk (1 RF of GG2, <50% biopsy core+). Treatment: To be determined  His case was discussed at tumor board with multiple specialists including radiation oncologist, urology oncologist, pathologist, radiologist. The patient was counseled on the natural history of prostate cancer and the standard treatment options that are available for prostate cancer.  We discussed his pathology and overall findings today.  His CML is currently under control with imatinib.  He has excellent performance status.    Prostatic adenocarcinoma (HCC) For favorable intermediate risk, per NCCN guideline, in patients with > 10 years of life expectancy, active surveillance, RP or RT are all options resulted in excellent long term survival.  Patient will follow-up with radiation oncology or urologic oncology for definitive treatment.  He may follow-up with medical oncology as needed.  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  Would not interfere with prostate cancer treatment Continue his TKI and follow-up with Dr. Arbutus Ped as he suggested.    Supportive calcium (1000-1200 mg daily from food and supplements) and vitamin D3 (1000 IU daily) Control and prevent diabetes Weight-bearing exercises (30 minutes per day) Limit alcohol consumption and avoid smoking  All questions were answered. No barriers to learning was  detected.  Melven Sartorius, MD 2/25/20252:48 PM  CHIEF COMPLAINTS/PURPOSE OF CONSULTATION:  Prostate cancer  HISTORY OF PRESENTING ILLNESS:  Michael [name] 58 y.o. male is here because of prostate cancer. Patient has seen Dr. Marcha Solders and DRE revealed approximately 30 to 40 g gland without nodules or induration. Biopsy showed one core of GS 3+4=7 GG2 and 3 cores of 3+3=6. All on the left.  TRUS/BX:  4/12 CORES + RIGHT APEX AND MID.  Single core Gleason 3+4=7 (33% inv) and 3 additional cores of 3+3=6. Trus Vol= 34ml DRE nl//Clinical stage T1c PreTx PSA = 4.3   (elevated isoPSA of 9.8)   I have reviewed his chart and materials related to his cancer extensively and collaborated history with the patient. Summary of oncologic history is as follows:  PSA data: 12/2015             1.89 12/2018             2.4 01/2020             2.8 02/2021             3.65 08/2021             3.03 07/2022             3.06 09/2023 4.34 11/2023 4.3  He denies any urinary symptoms such as dysuria, increased frequency, hematuria or difficulty urinating. Some weak stream.   Oncology History  Prostatic adenocarcinoma (HCC)  02/14/2024 Initial Diagnosis   Prostatic adenocarcinoma (HCC)   02/14/2024 Cancer Staging   Staging form: Prostate, AJCC 8th Edition - Clinical: Stage IIB (cT1c, cN0, cM0, PSA: 4.3, Grade Group: 2) - Signed by Melven Sartorius, MD on 02/14/2024 Prostate  specific antigen (PSA) range: Less than 10 Gleason primary pattern: 3 Gleason secondary pattern: 4 Gleason score: 7 Histologic grading system: 5 grade system     MEDICAL HISTORY:  Past Medical History:  Diagnosis Date   Chronic renal insufficiency, stage 2 (mild)    GFR around 60-65 (sCr 1.2-1.3 avg from 2010-2018). Slight inc in sCr to 1.35-1.4 avg 2019-2021.   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   Essential hypertension    started med 01/2020   Family history of pulmonary fibrosis     Father   Herniated disc, cervical 10/2013   History of pneumonia approx 2014   Hypertriglyceridemia    Increased prostate specific antigen (PSA) velocity 02/2021   Liver cyst    Left hepatic dome 2 cm cyst, CT Sep2013   Metabolic syndrome    elev trigs, low HDL, HTN   Pruritus ani, intermittent 01/17/2013   Splenomegaly    resolved CT Sep2013-->this is how his CML was detected/diagnosed   Vasovagal syncope    2024 on airplane    SURGICAL HISTORY: Past Surgical History:  Procedure Laterality Date   BONE MARROW BIOPSY  02/2008   COLONOSCOPY  01/09/2018   mild sigmoid diverticulosis, otherwise normal.  Recall 10 yrs.   PROSTATE BIOPSY     REFRACTIVE SURGERY  2000   TOENAIL EXCISION  01/16/2013   ingrown toenail- not removed    TONSILECTOMY, ADENOIDECTOMY, BILATERAL MYRINGOTOMY AND TUBES     age 16   TREATMENT FISTULA ANAL  2015   Anal fistula surgically repaired.    SOCIAL HISTORY: Social History   Socioeconomic History   Marital status: Married    Spouse name: Not on file   Number of children: Not on file   Years of education: Not on file   Highest education level: Bachelor's degree (e.g., BA, AB, BS)  Occupational History   Not on file  Tobacco Use   Smoking status: Never   Smokeless tobacco: Never  Vaping Use   Vaping status: Never Used  Substance and Sexual Activity   Alcohol use: Yes    Alcohol/week: 2.0 - 3.0 standard drinks of alcohol    Types: 2 - 3 Glasses of wine per week    Comment: per week   Drug use: No   Sexual activity: Not on file  Other Topics Concern   Not on file  Social History Narrative   Divorced 2023, 1 son and 1 daughter.   Orig from [place].   Grad from [employer].   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   No T/A/Ds.   Social Drivers of Corporate investment banker Strain: Low Risk  (01/30/2024)   Received from [facility]   Overall Financial Resource Strain (CARDIA)    Difficulty of Paying  Living Expenses: Not hard at all  Food Insecurity: No Food Insecurity (02/14/2024)   Hunger Vital Sign    Worried About Running Out of Food in the Last Year: Never true    Ran Out of Food in the Last Year: Never true  Transportation Needs: No Transportation Needs (02/14/2024)   PRAPARE - Administrator, Civil Service (Medical): No    Lack of Transportation (Non-Medical): No  Physical Activity: Sufficiently Active (01/30/2024)   Received from [facility]   Exercise Vital Sign    Days of Exercise per Week: 7 days    Minutes of Exercise per Session: 60 min  Stress: No Stress Concern Present (01/30/2024)   Received from [facility] - Occupational Stress Questionnaire    Feeling of Stress : Not at all  Social Connections: Moderately Integrated (01/30/2024)   Received from [facility]   Social Network    How would you rate your social network (family, work, friends)?: Adequate participation with social networks  Intimate Partner Violence: Not At Risk (02/14/2024)   Humiliation, Afraid, Rape, and Kick questionnaire    Fear of Current or Ex-Partner: No    Emotionally Abused: No    Physically Abused: No    Sexually Abused: No    FAMILY HISTORY: Family History  Problem Relation Age of Onset   Heart disease Mother        died from a  reaction to heparin    Cancer Father 42       prostate   Prostate cancer Father    Heart disease Father    Pulmonary fibrosis Father    Colon cancer Neg Hx    Colon polyps Neg Hx     ALLERGIES:  has no known allergies.  MEDICATIONS:  Current Outpatient Medications  Medication Sig Dispense Refill   amLODipine-benazepril (LOTREL) 5-10 MG capsule Take 1 capsule by mouth daily. 90 capsule 1   CREATINE MONOHYDRATE PO Take 3,000 mg by mouth every morning.     imatinib (GLEEVEC) 400 MG tablet TAKE 1 TABLET BY MOUTH 1 TIME A DAY TAKE WITH MEALS AND LARGE GLASS OF WATER 30 tablet 5   Magnesium 200 MG  TABS Take 200 mg by mouth daily.     sildenafil (VIAGRA) 50 MG tablet 1-2 tabs po qd prn 90 tablet 3   No current facility-administered medications for this visit.    REVIEW OF SYSTEMS:   All relevant systems were reviewed with the patient and are negative.  PHYSICAL EXAMINATION: ECOG PERFORMANCE STATUS: 0 - Asymptomatic  GENERAL: alert, no distress and comfortable   LABORATORY DATA:  I have reviewed the data as listed Lab Results  Component Value Date   WBC 3.7 (L) 10/11/2023   HGB 13.5 10/11/2023   HCT 41.2 10/11/2023   MCV 98.7 10/11/2023   PLT 179.0 10/11/2023   Recent Labs    10/11/23 0905  NA 141  K 4.7  CL 107  CO2 27  GLUCOSE 102*  BUN 19  CREATININE 1.49  CALCIUM 9.2  PROT 6.6  ALBUMIN 4.4  AST 27  ALT 24  ALKPHOS 74  BILITOT 0.5    RADIOGRAPHIC STUDIES: I have personally reviewed the radiological images as listed and agreed with the findings in the report. US Guided Needle Placement Result Date: 01/17/2024 CLINICAL DATA:  Ultrasound was provided for use by the ordering physician.  No provider Interpretation or professional fees incurred.    [place] PROSTATE BIOPSY MULTIPLE Result Date: 01/17/2024 Please see Notes tab for imaging impression.  [place] Transrectal Complete Result Date: 01/17/2024 Please see Notes tab for imaging impression.  [place] Intraoperative Result Date: 01/17/2024 CLINICAL DATA:  Ultrasound was provided for use by the ordering physician.  No provider Interpretation or professional fees incurred.

## 2024-02-13 NOTE — Progress Notes (Signed)
 RN left voicemail for reminder of upcoming PMDC visit on 2/25.

## 2024-02-14 ENCOUNTER — Encounter: Payer: Self-pay | Admitting: Radiation Oncology

## 2024-02-14 ENCOUNTER — Inpatient Hospital Stay: Payer: BC Managed Care – PPO

## 2024-02-14 ENCOUNTER — Ambulatory Visit
Admission: RE | Admit: 2024-02-14 | Discharge: 2024-02-14 | Disposition: A | Discharge status: Home / Self Care | Payer: BC Managed Care – PPO | Source: Ambulatory Visit | Attending: Radiation Oncology | Admitting: Radiation Oncology

## 2024-02-14 VITALS — BP 125/81 | HR 67 | Temp 98.0°F | Resp 18 | Ht 71.0 in | Wt 198.2 lb

## 2024-02-14 DIAGNOSIS — C9210 Chronic myeloid leukemia, BCR/ABL-positive, not having achieved remission: Secondary | ICD-10-CM

## 2024-02-14 DIAGNOSIS — Z8042 Family history of malignant neoplasm of prostate: Secondary | ICD-10-CM | POA: Insufficient documentation

## 2024-02-14 DIAGNOSIS — Z191 Hormone sensitive malignancy status: Secondary | ICD-10-CM | POA: Diagnosis not present

## 2024-02-14 DIAGNOSIS — C61 Malignant neoplasm of prostate: Secondary | ICD-10-CM | POA: Insufficient documentation

## 2024-02-14 DIAGNOSIS — Z79899 Other long term (current) drug therapy: Secondary | ICD-10-CM | POA: Diagnosis not present

## 2024-02-14 DIAGNOSIS — C9211 Chronic myeloid leukemia, BCR/ABL-positive, in remission: Secondary | ICD-10-CM | POA: Diagnosis not present

## 2024-02-14 NOTE — Assessment & Plan Note (Signed)
 Stable in molecular remission.  Would not interfere with prostate cancer treatment Continue his TKI and follow-up with Dr. Arbutus Ped as he suggested.

## 2024-02-14 NOTE — Consult Note (Signed)
 [facility]Disciplinary Clinic     02/14/2024   --------------------------------------------------------------------------------   Michael [name]  MRN: [id number]  DOB: 22-Mar-1966, 58 year old Male  SSN:    PRIMARY CARE:  Nicoletta Ba, MD  PRIMARY CARE FAX:  [phone]  REFERRING:  Roslyn Smiling  PROVIDER:  Heloise Purpura, M.D.  LOCATION:  [facility] [phone]     --------------------------------------------------------------------------------   CC/HPI: CC: Prostate Cancer   Physician requesting consult: Dr. Shelby Dubin  PCP: Dr. Earley Favor  Location of consult: [facility]   Michael [name] is a 58 year old gentleman with hypertension and a history of CML who was found to have an elevated PSA of 4.3. He underwent a TRUS biopsy of the prostate by Dr. Margo Aye on 01/17/24 that confirmed Gleason 3+4=7 adenocarcinoma with 4 out of 12 biopsy cores positive for malignancy.   Family history: Father - diagnosed in late 56s and treated with radiation therapy.   Imaging studies: None.   PMH: He has a history of hypertension and CML (remission). He does take Gleevec and is being followed by Dr. Shirline Frees. He is overdue for follow-up. He is now over 10 years out from his diagnosis.  PSH: No abdominal surgeries.   TNM stage: cT1c Nx Mx  PSA: 4.3  Gleason score: 3+4=7 (GG 2)  Biopsy (01/17/24): 4/12 cores positive  Left: L lateral apex (41%, 3+3=6), L apex (33%, 3+4=7), L lateral mid (1%, 3+3=6), L mid (2%, 3+3=6)  Right: Benign  Prostate volume: 33.6 cc  PSAD: 0.13   Nomogram  OC disease: 71%  EPE: 28%  SVI: 3%  LNI: 3%  PFS (5 year, 10 year): 87%, 77%   Urinary function: IPSS is 6.  Erectile function: SHIM score is 25. He does take sildenafil 50 mg as needed to optimize erectile function but does not necessarily need this medication on a regular basis.     ALLERGIES: None   MEDICATIONS: Amlodipine  Besylate-Benazepril 5 mg-10 mg capsule  Gleevec 400 mg tablet  Sildenafil Citrate 50 mg tablet     GU PSH: None   NON-GU PSH: Tonsillectomy     GU PMH: None   NON-GU PMH: Chronic lymphocytic leukemia of B-cell type in remission Hypertension    FAMILY HISTORY: prostate cancer in father - Father   SOCIAL HISTORY: No Social History    REVIEW OF SYSTEMS:    GU Review Male:   Patient denies frequent urination, hard to postpone urination, burning/ pain with urination, get up at night to urinate, leakage of urine, stream starts and stops, trouble starting your streams, and have to strain to urinate .  Gastrointestinal (Upper):   Patient denies nausea and vomiting.  Gastrointestinal (Lower):   Patient denies diarrhea and constipation.  Constitutional:   Patient denies fever, night sweats, weight loss, and fatigue.  Skin:   Patient denies skin rash/ lesion and itching.  Eyes:   Patient denies blurred vision and double vision.  Ears/ Nose/ Throat:   Patient denies sore throat and sinus problems.  Hematologic/Lymphatic:   Patient denies swollen glands and easy bruising.  Cardiovascular:   Patient denies leg swelling and chest pains.  Respiratory:   Patient denies cough and shortness of breath.  Endocrine:   Patient denies excessive thirst.  Musculoskeletal:   Patient denies back pain and joint pain.  Neurological:   Patient denies headaches and dizziness.  Psychologic:   Patient denies depression and anxiety.  VITAL SIGNS: None   MULTI-SYSTEM PHYSICAL EXAMINATION:    Constitutional: Well-nourished. No physical deformities. Normally developed. Good grooming.     Complexity of Data:  Lab Test Review:   PSA  Records Review:   Pathology Reports, Previous Patient Records   PROCEDURES: None   ASSESSMENT:      ICD-10 Details  1 GU:   Prostate Cancer - C61    PLAN:           Document Letter(s):  Created for Patient: Clinical Summary   Created for Patient: Clinical Summary          Notes:   1. Favorable intermediate risk prostate cancer: I had a detailed discussion with Michael [name] regarding his prostate cancer situation. He feels well-informed through his prior discussions with Dr. Margo Aye. He is also seeing Dr. Cordelia Pen in [place]. He also has met with Dr. Kathrynn Running and Dr. Cherly Hensen earlier this afternoon.   The patient was counseled about the natural history of prostate cancer and the standard treatment options that are available for prostate cancer. It was explained to him how his age and life expectancy, clinical stage, Gleason score/prognostic grade group, and PSA (and PSA density) affect his prognosis, the decision to proceed with additional staging studies, as well as how that information influences recommended treatment strategies. We discussed the roles for active surveillance, radiation therapy, surgical therapy, androgen deprivation, as well as ablative therapy and other investigational options for the treatment of prostate cancer as appropriate to his individual cancer situation. We discussed the risks and benefits of these options with regard to their impact on cancer control and also in terms of potential adverse events, complications, and impact on quality of life particularly related to urinary and sexual function. The patient was encouraged to ask questions throughout the discussion today and all questions were answered to his stated satisfaction. In addition, the patient was provided with and/or directed to appropriate resources and literature for further education about prostate cancer and treatment options. We discussed surgical therapy for prostate cancer including the different available surgical approaches. We discussed, in detail, the risks and expectations of surgery with regard to cancer control, urinary control, and erectile function as well as the expected postoperative recovery process. Additional risks of surgery including but not limited to  bleeding, infection, hernia formation, nerve damage, lymphocele formation, bowel/rectal injury potentially necessitating colostomy, damage to the urinary tract resulting in urine leakage, urethral stricture, and the cardiopulmonary risks such as myocardial infarction, stroke, death, venothromboembolism, etc. were explained. The risk of open surgical conversion for robotic/laparoscopic prostatectomy was also discussed.   At this time, he appears to be leaning toward active surveillance management as an initial strategy. Considering the very small volume of Gleason pattern 4 disease that he had on his biopsy, this would certainly be reasonable. We did discuss the option of proceeding with genomic testing such as Oncotype DX testing as an option and the recommendation to proceed with a confirmatory evaluation including an MRI and subsequent confirmatory biopsy over the next 6 to 12 months. He is going to continue to consider his options. If he proceeded with surgical therapy, my plan would be to perform a bilateral nerve sparing robot-assisted laparoscopic radical prostatectomy and bilateral pelvic lymphadenectomy. He will most certainly would defer his treatment until the summer after some activities that he has planned for the spring. If he did proceed with surgery, I also would touch base with Dr. Arbutus Ped about perioperative management of his Gleevec.  CC: Dr. Shelby Dubin  Dr. Nicoletta Ba  Dr. Si Gaul  Dr. Margaretmary Dys  Dr. Geanie Berlin    E & M CODES: We spent 64 minutes dedicated to evaluation and management time, including face to face interaction, discussions on coordination of care, documentation, result review, and discussion with others as applicable.

## 2024-02-14 NOTE — Assessment & Plan Note (Signed)
 For favorable intermediate risk, per NCCN guideline, in patients with > 10 years of life expectancy, active surveillance, RP or RT are all options resulted in excellent long term survival.  Patient will follow-up with radiation oncology or urologic oncology for definitive treatment.  He may follow-up with medical oncology as needed.

## 2024-02-14 NOTE — Addendum Note (Signed)
 Encounter addended by: Heloise Purpura, MD on: 02/14/2024 5:00 PM  Actions taken: Clinical Note Signed

## 2024-02-17 ENCOUNTER — Telehealth: Payer: Self-pay | Admitting: Internal Medicine

## 2024-02-17 ENCOUNTER — Telehealth: Payer: Self-pay

## 2024-02-17 NOTE — Telephone Encounter (Signed)
 Messaage received from the in basket to schedule patient for a follow up. Appointment has been scheduled.

## 2024-02-17 NOTE — Telephone Encounter (Signed)
 Copied from CRM [id number]. Topic: General - Other >> Feb 17, 2024 11:26 AM Gurney Maxin H wrote: Reason for CRM: Penni Bombard calling following up on clinicals needed for prior authorization for patient, please reach out to [place]. Penni Bombard states she spoke with Erie Noe on 2/26 and she was sending documents.  Penni Bombard [phone] Ext [phone]   Please assist

## 2024-02-22 ENCOUNTER — Encounter: Payer: Self-pay | Admitting: Genetic Counselor

## 2024-02-23 ENCOUNTER — Inpatient Hospital Stay: Payer: BC Managed Care – PPO | Admitting: Internal Medicine

## 2024-02-23 DIAGNOSIS — C61 Malignant neoplasm of prostate: Secondary | ICD-10-CM | POA: Diagnosis not present

## 2024-02-23 DIAGNOSIS — C9210 Chronic myeloid leukemia, BCR/ABL-positive, not having achieved remission: Secondary | ICD-10-CM | POA: Diagnosis not present

## 2024-02-23 NOTE — Progress Notes (Signed)
 [facility] [phone]   [phone]  PROGRESS NOTE FOR TELEMEDICINE VISITS  Michael Massed, MD [address] [place] [address]  I connected withNAME@ on 02/23/24 at  3:30 PM EST by video enabled telemedicine visit and verified that I am speaking with the correct person using two identifiers.   I discussed the limitations, risks, security and privacy concerns of performing an evaluation and management service by telemedicine and the availability of in-person appointments. I also discussed with the patient that there may be a patient responsible charge related to this service. The patient expressed understanding and agreed to proceed.  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  2) prostatic adenocarcinoma diagnosed in February 2025 with Gleason score of 6 (3+3) and another area of Gleason score of 7 (3+4) currently on active surveillance by Dr. Laverle Patter.  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  History of Present Illness   Michael [name] is a 58 year old male with chronic myeloid leukemia who presents for routine follow-up and discussion of prostate cancer management.  He has a history of chronic myeloid leukemia diagnosed in February 2009. He has been on imatinib 400 mg daily since diagnosis and is doing well with no detectable levels of BCR-ABL. His white blood cell count has fluctuated but remains within acceptable limits, with a recent  count of 3.7. Hemoglobin and platelet levels are stable.  Recently, he has been dealing with prostate cancer concerns. During a routine physical, his PSA level was noted to be 4.3, prompting a referral to a urologist. Further testing showed a PSA level of 9.8, leading to a biopsy on January 17, 2024, which revealed a small focus of prostatic adenocarcinoma with a Gleason score of 6 (3+3) and another area with a Gleason score of 7 (3+4). He has consulted with multiple specialists and is currently under active surveillance, with plans for an MRI six months post-biopsy and another biopsy in January 2026.  He has expressed concerns about potential future treatments for prostate cancer and his impact on his current medication regimen, particularly imatinib. He has discussed the possibility of needing to pause imatinib if more aggressive treatment becomes necessary.       MEDICAL HISTORY: Past Medical History:  Diagnosis Date   Chronic renal insufficiency, stage 2 (mild)    GFR around 60-65 (sCr 1.2-1.3 avg from 2010-2018). Slight inc in sCr to 1.35-1.4 avg 2019-2021.   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   Essential hypertension    started med 01/2020   Family history of pulmonary fibrosis    Father   Herniated disc, cervical 10/2013   History of pneumonia approx 2014   Hypertriglyceridemia    Increased prostate specific antigen (PSA) velocity 02/2021   Liver cyst    Left hepatic dome 2 cm cyst, CT Sep2013   Metabolic syndrome    elev trigs, low HDL, HTN   Pruritus ani, intermittent 01/17/2013  Splenomegaly    resolved CT Sep2013-->this is how his CML was detected/diagnosed   Vasovagal syncope    2024 on airplane    ALLERGIES:  has no known allergies.  MEDICATIONS:  Current Outpatient Medications  Medication Sig Dispense Refill   amLODipine-benazepril (LOTREL) 5-10 MG capsule Take 1 capsule by mouth daily. 90 capsule 1   CREATINE MONOHYDRATE  PO Take 3,000 mg by mouth every morning.     imatinib (GLEEVEC) 400 MG tablet TAKE 1 TABLET BY MOUTH 1 TIME A DAY TAKE WITH MEALS AND LARGE GLASS OF WATER 30 tablet 5   Magnesium 200 MG TABS Take 200 mg by mouth daily.     sildenafil (VIAGRA) 50 MG tablet 1-2 tabs po qd prn 90 tablet 3   No current facility-administered medications for this visit.    SURGICAL HISTORY:  Past Surgical History:  Procedure Laterality Date   BONE MARROW BIOPSY  02/2008   COLONOSCOPY  01/09/2018   mild sigmoid diverticulosis, otherwise normal.  Recall 10 yrs.   PROSTATE BIOPSY     REFRACTIVE SURGERY  2000   TOENAIL EXCISION  01/16/2013   ingrown toenail- not removed    TONSILECTOMY, ADENOIDECTOMY, BILATERAL MYRINGOTOMY AND TUBES     age 35   TREATMENT FISTULA ANAL  2015   Anal fistula surgically repaired.    REVIEW OF SYSTEMS:  Constitutional: positive for fatigue Eyes: negative Ears, nose, mouth, throat, and face: negative Respiratory: negative Cardiovascular: negative Gastrointestinal: negative Genitourinary:negative Integument/breast: negative Hematologic/lymphatic: negative Musculoskeletal:negative Neurological: negative Behavioral/Psych: negative Endocrine: negative Allergic/Immunologic: negative     LABORATORY DATA: Lab Results  Component Value Date   WBC 3.7 (L) 10/11/2023   HGB 13.5 10/11/2023   HCT 41.2 10/11/2023   MCV 98.7 10/11/2023   PLT 179.0 10/11/2023      Chemistry      Component Value Date/Time   NA 141 10/11/2023 0905   NA 141 08/23/2017 1545   K 4.7 10/11/2023 0905   K 3.8 08/23/2017 1545   CL 107 10/11/2023 0905   CL 108 (H) 01/19/2013 1206   CO2 27 10/11/2023 0905   CO2 26 08/23/2017 1545   BUN 19 10/11/2023 0905   BUN 20.2 08/23/2017 1545   CREATININE 1.49 10/11/2023 0905   CREATININE 1.61 (H) 02/09/2022 0757   CREATININE 1.36 (H) 01/31/2020 1002   CREATININE 1.3 08/23/2017 1545   GLU 97 01/17/2015 0000      Component Value Date/Time   CALCIUM 9.2  10/11/2023 0905   CALCIUM 9.3 08/23/2017 1545   ALKPHOS 74 10/11/2023 0905   ALKPHOS 100 08/23/2017 1545   AST 27 10/11/2023 0905   AST 36 02/09/2022 0757   AST 33 08/23/2017 1545   ALT 24 10/11/2023 0905   ALT 31 02/09/2022 0757   ALT 32 08/23/2017 1545   BILITOT 0.5 10/11/2023 0905   BILITOT 0.9 02/09/2022 0757   BILITOT 0.54 08/23/2017 1545       RADIOGRAPHIC STUDIES: No results found.  ASSESSMENT AND PLAN: This is a very pleasant 58 years old white male with chronic myeloid leukemia diagnosed in February 2009. The patient has been on treatment with Gleevec 400 mg p.o. daily since that time and he is feeling fine. He was also recently diagnosed with prostate cancer in February 2025 currently on active surveillance.    Prostatic Adenocarcinoma Diagnosed following a biopsy on January 17, 2024, revealing Gleason scores of 6 (3+3) and 7 (3+4). Consensus from Drs. Denton Ar, Howard, and Carrie Mew is to proceed  with active surveillance due to potential side effects of aggressive treatments. MRI planned six months post-biopsy and repeat biopsy in January 2026. Discussed potential imatinib adjustment if treatment becomes necessary. Patient prefers to avoid prostatectomy unless absolutely necessary. - Schedule MRI six months post-January biopsy - Plan for repeat biopsy in January 2026 - Continue active surveillance  Chronic Myeloid Leukemia (CML) Diagnosed in February 2009, on imatinib 400 mg daily. BCR-ABL levels undetectable, WBC, hemoglobin, and platelets within acceptable ranges. Discussed potential treatment adjustments if prostate cancer treatment becomes necessary. Patient prefers not to discontinue imatinib due to risk of resistance. - Continue imatinib 400 mg daily - Monitor blood counts regularly - Discuss potential treatment adjustments with Dr. Cherly Hensen if prostate cancer treatment becomes necessary  General Health Maintenance Regular physical exams and blood work with Dr.  Marvel Plan. Recent blood work sent to [facility] instead of [facility], causing billing issues. - Ensure future blood work is sent to [facility] - Coordinate physical exams and blood work with Dr. Milinda Cave  Follow-up - Schedule next visit with hematologist/oncologist in one year - Coordinate blood work with Dr. Milinda Cave one month prior to the next visit.   The patient was advised to call immediately if he has any other concerning symptoms in the interval. I discussed the assessment and treatment plan with the patient. The patient was provided an opportunity to ask questions and all were answered. The patient agreed with the plan and demonstrated an understanding of the instructions.   The patient was advised to call back or seek an in-person evaluation if the symptoms worsen or if the condition fails to improve as anticipated.  I provided 30 minutes of face-to-face video visit time during this encounter, and > 50% was spent counseling as documented under my assessment & plan.  Lajuana Matte, MD 02/23/2024 3:33 PM  Disclaimer: This note was dictated with voice recognition software. Similar sounding words can inadvertently be transcribed and may not be corrected upon review.

## 2024-02-24 NOTE — Progress Notes (Signed)
 Patient was recently seen at the [facility] on 2/25 for his stage T1c adenocarcinoma of the prostate with a Gleason's score of 3+4 and a PSA of 4.34.  He has confirmed he would like to proceed with active surveillance under the care of Dr. Laverle Patter.  MD notified.  RN encouraged patient to call with any questions that may arise.

## 2024-02-28 NOTE — Telephone Encounter (Signed)
 Clinical documents faxed back

## 2024-03-02 ENCOUNTER — Other Ambulatory Visit: Payer: Self-pay | Admitting: Urology

## 2024-03-02 DIAGNOSIS — C61 Malignant neoplasm of prostate: Secondary | ICD-10-CM

## 2024-03-29 ENCOUNTER — Other Ambulatory Visit: Payer: Self-pay | Admitting: Internal Medicine

## 2024-03-29 DIAGNOSIS — C9210 Chronic myeloid leukemia, BCR/ABL-positive, not having achieved remission: Secondary | ICD-10-CM

## 2024-03-31 ENCOUNTER — Other Ambulatory Visit: Payer: Self-pay | Admitting: Family Medicine

## 2024-04-02 ENCOUNTER — Other Ambulatory Visit: Payer: Self-pay

## 2024-04-12 NOTE — Patient Instructions (Signed)

## 2024-04-13 ENCOUNTER — Encounter: Payer: Self-pay | Admitting: Family Medicine

## 2024-04-13 ENCOUNTER — Ambulatory Visit: Admitting: Family Medicine

## 2024-04-13 VITALS — BP 122/81 | HR 57 | Temp 98.2°F | Wt 199.2 lb

## 2024-04-13 DIAGNOSIS — I10 Essential (primary) hypertension: Secondary | ICD-10-CM | POA: Diagnosis not present

## 2024-04-13 DIAGNOSIS — N529 Male erectile dysfunction, unspecified: Secondary | ICD-10-CM

## 2024-04-13 MED ORDER — SILDENAFIL CITRATE 50 MG PO TABS: ORAL_TABLET | ORAL | 3 refills | Status: DC

## 2024-04-13 NOTE — Progress Notes (Signed)
 OFFICE VISIT  04/13/2024  CC:  Chief Complaint  Patient presents with   Hypertension    Patient is a 58 y.o. male who presents for 65-month follow-up hypertension. A/P as of last visit: " #1 hypertension, well-controlled.  Continue Lotrel 5-10 mg, 1 capsule daily."  INTERIM HX: Michael [name] is feel great. He was diagnosed with clinically localized prostate cancer 3 months ago.  Plan is active surveillance.  No acute concerns. Past Medical History:  Diagnosis Date   Chronic renal insufficiency, stage 2 (mild)    GFR around 60-65 (sCr 1.2-1.3 avg from 2010-2018). Slight inc in sCr to 1.35-1.4 avg 2019-2021.   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   Essential hypertension    started med 01/2020   Family history of pulmonary fibrosis    Father   Herniated disc, cervical 10/2013   History of pneumonia approx 2014   Hypertriglyceridemia    Increased prostate specific antigen (PSA) velocity 02/2021   Liver cyst    Left hepatic dome 2 cm cyst, CT Sep2013   Metabolic syndrome    elev trigs, low HDL, HTN   Pruritus ani, intermittent 01/17/2013   Splenomegaly    resolved CT Sep2013-->this is how his CML was detected/diagnosed   Vasovagal syncope    2024 on airplane    Past Surgical History:  Procedure Laterality Date   BONE MARROW BIOPSY  02/2008   COLONOSCOPY  01/09/2018   mild sigmoid diverticulosis, otherwise normal.  Recall 10 yrs.   PROSTATE BIOPSY     REFRACTIVE SURGERY  2000   TOENAIL EXCISION  01/16/2013   ingrown toenail- not removed    TONSILECTOMY, ADENOIDECTOMY, BILATERAL MYRINGOTOMY AND TUBES     age 42   TREATMENT FISTULA ANAL  2015   Anal fistula surgically repaired.    Outpatient Medications Prior to Visit  Medication Sig Dispense Refill   amLODipine -benazepril  (LOTREL) 5-10 MG capsule TAKE 1 CAPSULE BY MOUTH EVERY DAY 90 capsule 1   CREATINE MONOHYDRATE PO Take 3,000 mg by mouth every morning.     imatinib  (GLEEVEC ) 400 MG  tablet TAKE 1 TABLET BY MOUTH 1 TIME A DAY TAKE WITH MEALS AND LARGE GLASS OF WATER 30 tablet 5   Magnesium 200 MG TABS Take 200 mg by mouth daily.     sildenafil  (VIAGRA ) 50 MG tablet 1-2 tabs po qd prn 90 tablet 3   No facility-administered medications prior to visit.    No Known Allergies  Review of Systems As per HPI  PE:    04/13/2024    1:11 PM 02/14/2024    1:14 PM 01/24/2024   11:19 AM  Vitals with BMI  Height  5\' 11"    Weight 199 lbs 3 oz 198 lbs 3 oz   BMI  27.66   Systolic 122 125 161  Diastolic 81 81 87  Pulse 57 67 63     Physical Exam  Gen: Alert, well appearing.  Patient is oriented to person, place, time, and situation. AFFECT: pleasant, lucid thought and speech.   LABS:  Last metabolic panel Lab Results  Component Value Date   GLUCOSE 102 (H) 10/11/2023   NA 141 10/11/2023   K 4.7 10/11/2023   CL 107 10/11/2023   CO2 27 10/11/2023   BUN 19 10/11/2023   CREATININE 1.49 10/11/2023   GFR 51.41 (L) 10/11/2023   CALCIUM 9.2 10/11/2023   PROT 6.6 10/11/2023   ALBUMIN 4.4 10/11/2023   BILITOT  0.5 10/11/2023   ALKPHOS 74 10/11/2023   AST 27 10/11/2023   ALT 24 10/11/2023   ANIONGAP 5 02/09/2022    IMPRESSION AND PLAN:  #1 hypertension, well-controlled on amlodipine -benazepril  5-10 mg, 1 tab daily. Annual renal function checks have been stable with GFR in the CRI 3a range for the last 5 years.  Next check will be in 6 months.  2.  ED. refill sildenafil  50 mg tabs.  3.  Prostate cancer, active surveillance per urology.  An After Visit Summary was printed and given to the patient.  FOLLOW UP: Return in about 6 months (around 10/13/2024) for annual CPE (fasting). Next CPE October 2025 Signed:  Arletha Lady, MD           04/13/2024

## 2024-04-30 ENCOUNTER — Ambulatory Visit: Payer: Self-pay

## 2024-04-30 NOTE — Telephone Encounter (Signed)
 Copied from CRM [id number]. Topic: Clinical - Red Word Triage >> Apr 30, 2024  2:23 PM [name] wrote: [facility] that prompted transfer to Nurse Triage: Patient had a slip and fall down the stairs on 5/3. He heard a pop and was sure he tore his ACL. He says it is feeling better, but he is stillhvaing 4/10 pain when puittting weight on it. No pain when resting. Pain is in his quad.    Chief Complaint: Michael [name] 04/21/24, injured left knee. Has swelling, still hurts to ambulate on it. Symptoms: Above Frequency: 04/21/24 Pertinent Negatives: Patient denies  Disposition: [] ED /[] Urgent Care (no appt availability in office) / [x] Appointment(In office/virtual)/ []  [place] [facility]/ [] Home Care/ [] Refused Recommended Disposition /[] [place] [facility]/ []  Follow-up with PCP Additional Notes: Agrees with appointment.  Reason for Disposition  A "snap" or "pop" was heard at the time of injury  Answer Assessment - Initial Assessment Questions 1. MECHANISM: "How did the injury happen?" (e.g., twisting injury, direct blow)      fell 2. ONSET: "When did the injury happen?" (Minutes or hours ago)      04/21/24 3. LOCATION: "Where is the injury located?"      Left 4. APPEARANCE of INJURY: "What does the injury look like?"      Swelling 5. SEVERITY: "Can you put weight on that leg?" "Can you walk?"      Moderate 6. SIZE: For cuts, bruises, or swelling, ask: "How large is it?" (e.g., inches or centimeters;  entire joint)      N/a 7. PAIN: "Is there pain?" If Yes, ask: "How bad is the pain?"  "What does it keep you from doing?" (e.g., Scale 1-10; or mild, moderate, severe)   -  NONE: (0): no pain   -  MILD (1-3): doesn't interfere with normal activities    -  MODERATE (4-7): interferes with normal activities (e.g., work or school) or awakens from sleep, limping    -  SEVERE (8-10): excruciating pain, unable to do any normal activities, unable to walk     4 8. TETANUS: For any breaks in the skin, ask: "When  was the last tetanus booster?"     N/a 9. OTHER SYMPTOMS: "Do you have any other symptoms?"  (e.g., "pop" when knee injured, swelling, locking, buckling)      Pop 10. PREGNANCY: "Is there any chance you are pregnant?" "When was your last menstrual period?"       N/a  Protocols used: Knee Injury-A-AH

## 2024-04-30 NOTE — Telephone Encounter (Signed)
 Pt is scheduled to see Dr. Gatha Kaska 5/13.

## 2024-05-01 ENCOUNTER — Encounter: Payer: Self-pay | Admitting: Urgent Care

## 2024-05-01 ENCOUNTER — Ambulatory Visit: Payer: Self-pay | Admitting: Urgent Care

## 2024-05-01 ENCOUNTER — Ambulatory Visit: Admitting: Urgent Care

## 2024-05-01 ENCOUNTER — Other Ambulatory Visit: Payer: Self-pay | Admitting: Urgent Care

## 2024-05-01 ENCOUNTER — Ambulatory Visit

## 2024-05-01 VITALS — BP 106/75 | HR 65 | Wt 196.8 lb

## 2024-05-01 DIAGNOSIS — M25462 Effusion, left knee: Secondary | ICD-10-CM

## 2024-05-01 DIAGNOSIS — W109XXD Fall (on) (from) unspecified stairs and steps, subsequent encounter: Secondary | ICD-10-CM

## 2024-05-01 DIAGNOSIS — M2392 Unspecified internal derangement of left knee: Secondary | ICD-10-CM

## 2024-05-01 DIAGNOSIS — S76112A Strain of left quadriceps muscle, fascia and tendon, initial encounter: Secondary | ICD-10-CM | POA: Diagnosis not present

## 2024-05-01 DIAGNOSIS — M25562 Pain in left knee: Secondary | ICD-10-CM | POA: Diagnosis not present

## 2024-05-01 DIAGNOSIS — S76119A Strain of unspecified quadriceps muscle, fascia and tendon, initial encounter: Secondary | ICD-10-CM

## 2024-05-01 DIAGNOSIS — C9590 Leukemia, unspecified not having achieved remission: Secondary | ICD-10-CM | POA: Diagnosis not present

## 2024-05-01 DIAGNOSIS — S76109A Unspecified injury of unspecified quadriceps muscle, fascia and tendon, initial encounter: Secondary | ICD-10-CM

## 2024-05-01 DIAGNOSIS — S8992XA Unspecified injury of left lower leg, initial encounter: Secondary | ICD-10-CM

## 2024-05-01 MED ORDER — DICLOFENAC SODIUM 75 MG PO TBEC: 75.0000 mg | DELAYED_RELEASE_TABLET | Freq: Two times a day (BID) | ORAL | 0 refills | Status: DC

## 2024-05-01 NOTE — Telephone Encounter (Signed)
 Copied from CRM [id number]. Topic: Clinical - Medical Advice >> May 01, 2024 11:25 AM [name] wrote: Reason for CRM: Patient to follow up on Michael [name] message from his appointment. He is requesting a callback.

## 2024-05-01 NOTE — Progress Notes (Signed)
 Called and spoke with pt, all questions/ concerns addressed. MRI scheduled for today at 2:30pm

## 2024-05-01 NOTE — Progress Notes (Signed)
 Established Patient Office Visit  Subjective:  Patient ID: Michael [name], male    DOB: Nov 11, 1966  Age: 58 y.o. MRN: [id number]  Chief Complaint  Patient presents with   Knee Pain    Pt had a knee injury on May 3rd due to a fall. He has been having left knee pain since.    Knee Pain     Discussed the use of AI scribe software for clinical note transcription with the patient, who gave verbal consent to proceed.  History of Present Illness   Michael [name] is a 58 year old male who presents with left knee pain and swelling after a fall.  He slipped on his right foot while descending stairs, resulting in a fall and hearing a 'snap' in his left leg ten days ago. Initially, there was significant swelling. He has no prior history of knee issues.  He can walk on his heel and get around but cannot bear full weight on the left leg when ascending stairs. The pain is primarily above the knee, particularly in the superior patellar tendon area, with warmth and swelling noted. He has been managing symptoms with icing, elevation, and walking to maintain strength. No prior medical evaluation or imaging has been sought for this injury.  He has a history of a herniated disc in 2014, which caused sciatica down the left leg, but no chronic knee issues before this incident. He was taking Aleve once daily but has recently stopped. He is concerned about the impact on his ability to participate in upcoming golf tournaments and other activities.  No bruising or gout symptoms, and no pain when sitting or sleeping.      Patient Active Problem List   Diagnosis Date Noted   Prostatic adenocarcinoma (HCC) 02/14/2024   Encounter for therapeutic drug monitoring 02/09/2022   Achilles tendinosis 05/22/2017   Lumbar radiculopathy 10/31/2013   Pruritus ani, intermittent 01/17/2013   Perirectal fistula intersphincteric s/p LIFT  10/09/2012   Chronic myeloid leukemia (HCC) 03/10/2012   Anemia 02/12/2008    Past Medical History:  Diagnosis Date   Chronic renal insufficiency, stage 2 (mild)    GFR around 60-65 (sCr 1.2-1.3 avg from 2010-2018). Slight inc in sCr to 1.35-1.4 avg 2019-2021.   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   Essential hypertension    started med 01/2020   Family history of pulmonary fibrosis    Father   Herniated disc, cervical 10/2013   History of pneumonia approx 2014   Hypertriglyceridemia    Increased prostate specific antigen (PSA) velocity 02/2021   Liver cyst    Left hepatic dome 2 cm cyst, CT Sep2013   Metabolic syndrome    elev trigs, low HDL, HTN   Prostate cancer (HCC)    Pruritus ani, intermittent 01/17/2013   Splenomegaly    resolved CT Sep2013-->this is how his CML was detected/diagnosed   Vasovagal syncope    2024 on airplane   Past Surgical History:  Procedure Laterality Date   BONE MARROW BIOPSY  02/2008   COLONOSCOPY  01/09/2018   mild sigmoid diverticulosis, otherwise normal.  Recall 10 yrs.   EYE SURGERY  04/20/1999   Lasik   PROSTATE BIOPSY     REFRACTIVE SURGERY  2000   TOENAIL EXCISION  01/16/2013   ingrown toenail- not removed    TONSILECTOMY, ADENOIDECTOMY, BILATERAL MYRINGOTOMY AND TUBES     age 89   TREATMENT FISTULA ANAL  2015   Anal fistula surgically repaired.   Social History   Tobacco Use   Smoking status: Never   Smokeless tobacco: Never  Vaping Use   Vaping status: Never Used  Substance Use Topics   Alcohol use: Yes    Alcohol/week: 2.0 - 3.0 standard drinks of alcohol    Types: 2 - 3 Glasses of wine per week    Comment: per week   Drug use: No      ROS: as noted in HPI  Objective:     BP 106/75   Pulse 65   Wt 196 lb 12.8 oz (89.3 kg)   SpO2 100%   BMI 27.45 kg/m  BP Readings from Last 3 Encounters:  05/01/24 106/75  04/13/24 122/81  02/14/24 125/81   Wt Readings from Last 3 Encounters:  05/01/24 196 lb 12.8 oz (89.3 kg)  04/13/24 199 lb 3.2 oz (90.4 kg)   02/14/24 198 lb 3.2 oz (89.9 kg)      Physical Exam Vitals and nursing note reviewed.  Constitutional:      General: He is not in acute distress.    Appearance: Normal appearance. He is not ill-appearing, toxic-appearing or diaphoretic.  HENT:     Head: Normocephalic and atraumatic.  Cardiovascular:     Rate and Rhythm: Normal rate.     Pulses: Normal pulses.  Pulmonary:     Effort: Pulmonary effort is normal. No respiratory distress.  Musculoskeletal:     Left upper leg: Swelling and tenderness present.     Right knee: No swelling, effusion, ecchymosis, bony tenderness or crepitus. Normal range of motion. No tenderness. Normal meniscus.     Left knee: Swelling and effusion present. No erythema, ecchymosis, bony tenderness or crepitus. Decreased range of motion. Tenderness present over the patellar tendon (superior). No MCL or LCL tenderness. No LCL laxity, MCL laxity, ACL laxity or PCL laxity.Abnormal meniscus (positive apley grind test).     Instability Tests: Anterior drawer test negative. Posterior drawer test negative. Anterior Lachman test negative. Medial McMurray test negative and lateral McMurray test negative.     Left lower leg: Normal. No swelling. No edema.     Left ankle: Normal.       Legs:  Neurological:     Mental Status: He is alert.      No results found for any visits on 05/01/24.  Last CBC Lab Results  Component Value Date   WBC 3.7 (L) 10/11/2023   HGB 13.5 10/11/2023   HCT 41.2 10/11/2023   MCV 98.7 10/11/2023   MCH 33.2 02/09/2022   RDW 13.1 10/11/2023   PLT 179.0 10/11/2023   Last metabolic panel Lab Results  Component Value Date   GLUCOSE 102 (H) 10/11/2023   NA 141 10/11/2023   K 4.7 10/11/2023   CL 107 10/11/2023   CO2 27 10/11/2023   BUN 19 10/11/2023   CREATININE 1.49 10/11/2023   GFR 51.41 (L) 10/11/2023   CALCIUM 9.2 10/11/2023   PROT 6.6 10/11/2023   ALBUMIN 4.4 10/11/2023   BILITOT 0.5 10/11/2023   ALKPHOS 74 10/11/2023    AST 27 10/11/2023   ALT 24 10/11/2023   ANIONGAP 5 02/09/2022      The 10-year ASCVD risk score (Arnett DK, et al., 2019) is: 8.4%  Assessment & Plan:  Internal derangement of left knee -     DG Knee Complete 4 Views Left; Future -     Diclofenac Sodium; Take 1 tablet (75 mg total) by mouth  2 (two) times daily with a meal.  Dispense: 28 tablet; Refill: 0  Knee effusion, left  Assessment and Plan    Left knee effusion/ internal derangement Acute effusion post-fall with swelling, warmth, and pain. Differential includes avulsion fracture, meniscal injury, and partial ligamentous rupture. X-ray needed to assess structural abnormalities. MRI considered if x-ray normal and symptoms persist. - Order stat x-ray of left knee, including sunrise view, at [place] location. - Prescribe diclofenac twice daily with meals for up to 14 days. - Advise against additional anti-inflammatories like Aleve. - Communicate x-ray results via MyChart. - Consider MRI if x-ray is normal and symptoms persist.        No follow-ups on file.   Mandy Second, PA

## 2024-05-01 NOTE — Patient Instructions (Signed)
 Please go to the following address to obtain a stat xay knee:  [facility] Address: [address] [place], [place] [address] Phone: [phone]  Please take the anti-inflammatory medication called in today twice daily with food. Do not take any additional OTC NSAIDS (advil, motrin, ibuprofen, aleve, naproxen).   We will contact you with results of the xray to determine further treatment plan.

## 2024-05-04 ENCOUNTER — Encounter (HOSPITAL_BASED_OUTPATIENT_CLINIC_OR_DEPARTMENT_OTHER): Payer: Self-pay | Admitting: Orthopedic Surgery

## 2024-05-04 ENCOUNTER — Other Ambulatory Visit: Payer: Self-pay

## 2024-05-04 DIAGNOSIS — S76112A Strain of left quadriceps muscle, fascia and tendon, initial encounter: Secondary | ICD-10-CM | POA: Diagnosis not present

## 2024-05-07 ENCOUNTER — Encounter (HOSPITAL_BASED_OUTPATIENT_CLINIC_OR_DEPARTMENT_OTHER)
Admission: RE | Admit: 2024-05-07 | Discharge: 2024-05-07 | Disposition: A | Discharge status: Home / Self Care | Source: Ambulatory Visit | Attending: Orthopedic Surgery | Admitting: Orthopedic Surgery

## 2024-05-07 DIAGNOSIS — I10 Essential (primary) hypertension: Secondary | ICD-10-CM | POA: Insufficient documentation

## 2024-05-07 DIAGNOSIS — W010XXA Fall on same level from slipping, tripping and stumbling without subsequent striking against object, initial encounter: Secondary | ICD-10-CM | POA: Diagnosis not present

## 2024-05-07 DIAGNOSIS — I129 Hypertensive chronic kidney disease with stage 1 through stage 4 chronic kidney disease, or unspecified chronic kidney disease: Secondary | ICD-10-CM | POA: Diagnosis not present

## 2024-05-07 DIAGNOSIS — Z01810 Encounter for preprocedural cardiovascular examination: Secondary | ICD-10-CM | POA: Insufficient documentation

## 2024-05-07 DIAGNOSIS — Z79899 Other long term (current) drug therapy: Secondary | ICD-10-CM | POA: Diagnosis not present

## 2024-05-07 DIAGNOSIS — N182 Chronic kidney disease, stage 2 (mild): Secondary | ICD-10-CM | POA: Diagnosis not present

## 2024-05-07 DIAGNOSIS — E8881 Metabolic syndrome: Secondary | ICD-10-CM | POA: Diagnosis not present

## 2024-05-07 DIAGNOSIS — S76112A Strain of left quadriceps muscle, fascia and tendon, initial encounter: Secondary | ICD-10-CM | POA: Diagnosis not present

## 2024-05-07 NOTE — H&P (Signed)
 PREOPERATIVE H&P  Chief Complaint: left knee injury  HPI: Michael [name] is a 58 y.o. male who presents for preoperative history and physical with a diagnosis of left quadriceps rupture. He basically took a fall, his foot slipped, this was on 04-21-24. He had an acute onset of significant pain. He has the limited ability to function with it. He is using a knee immobilizer. He has used anti-inflammatories. He recently had an MRI which should a left quad rupture. He is otherwise fairly healthy.  This is significantly impairing activities of daily living.  He has elected for surgical management.   Past Medical History:  Diagnosis Date   Chronic renal insufficiency, stage 2 (mild)    GFR around 60-65 (sCr 1.2-1.3 avg from 2010-2018). Slight inc in sCr to 1.35-1.4 avg 2019-2021.   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   Essential hypertension    started med 01/2020   Family history of pulmonary fibrosis    Father   Herniated disc, cervical 10/2013   History of pneumonia approx 2014   Hypertriglyceridemia    Increased prostate specific antigen (PSA) velocity 02/2021   Liver cyst    Left hepatic dome 2 cm cyst, CT Sep2013   Metabolic syndrome    elev trigs, low HDL, HTN   Prostate cancer (HCC)    Pruritus ani, intermittent 01/17/2013   Splenomegaly    resolved CT Sep2013-->this is how his CML was detected/diagnosed   Vasovagal syncope    2024 on airplane   Past Surgical History:  Procedure Laterality Date   BONE MARROW BIOPSY  02/2008   COLONOSCOPY  01/09/2018   mild sigmoid diverticulosis, otherwise normal.  Recall 10 yrs.   EYE SURGERY  04/20/1999   Lasik   PROSTATE BIOPSY     REFRACTIVE SURGERY  2000   TOENAIL EXCISION  01/16/2013   ingrown toenail- not removed    TONSILECTOMY, ADENOIDECTOMY, BILATERAL MYRINGOTOMY AND TUBES     age 34   TREATMENT FISTULA ANAL  2015   Anal fistula surgically repaired.   Social History   Socioeconomic  History   Marital status: Divorced    Spouse name: Not on file   Number of children: Not on file   Years of education: Not on file   Highest education level: Bachelor's degree (e.g., BA, AB, BS)  Occupational History   Not on file  Tobacco Use   Smoking status: Never   Smokeless tobacco: Never  Vaping Use   Vaping status: Never Used  Substance and Sexual Activity   Alcohol use: Yes    Alcohol/week: 2.0 - 3.0 standard drinks of alcohol    Types: 2 - 3 Glasses of wine per week    Comment: social   Drug use: No   Sexual activity: Yes  Other Topics Concern   Not on file  Social History Narrative   Divorced 2023, 1 son and 1 daughter.   Orig from [place] .   Grad from [employer].   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   No T/A/Ds.   Social Drivers of Corporate investment banker Strain: Low Risk  (04/09/2024)   Overall Financial Resource Strain (CARDIA)    Difficulty of Paying Living Expenses: Not hard at all  Food Insecurity: No Food Insecurity (04/09/2024)   Hunger Vital Sign    Worried About Running Out of Food in the Last Year: Never true    Ran Out  of Food in the Last Year: Never true  Transportation Needs: No Transportation Needs (04/09/2024)   PRAPARE - Administrator, Civil Service (Medical): No    Lack of Transportation (Non-Medical): No  Physical Activity: Sufficiently Active (04/09/2024)   Exercise Vital Sign    Days of Exercise per Week: 6 days    Minutes of Exercise per Session: 40 min  Stress: No Stress Concern Present (04/09/2024)   [employer] of Occupational Health - Occupational Stress Questionnaire    Feeling of Stress : Not at all  Social Connections: Moderately Isolated (04/09/2024)   Social Connection and Isolation Panel [NHANES]    Frequency of Communication with Friends and Family: Twice a week    Frequency of Social Gatherings with Friends and Family: Three times a week    Attends Religious Services:  Never    Active Member of Clubs or Organizations: Yes    Attends Banker Meetings: 1 to 4 times per year    Marital Status: Divorced   Family History  Problem Relation Age of Onset   Heart disease Mother        died from a  reaction to heparin    Prostate cancer Father 25   Heart disease Father    Pulmonary fibrosis Father    Varicose Veins Father    Colon cancer Neg Hx    Colon polyps Neg Hx    No Known Allergies Prior to Admission medications   Medication Sig Start Date End Date Taking? Authorizing Provider  amLODipine -benazepril  (LOTREL) 5-10 MG capsule TAKE 1 CAPSULE BY MOUTH EVERY DAY 04/02/24  Yes McGowen, Minetta Aly, MD  CREATINE MONOHYDRATE PO Take 3,000 mg by mouth every morning.   Yes [provider]  diclofenac  (VOLTAREN ) 75 MG EC tablet Take 1 tablet (75 mg total) by mouth 2 (two) times daily with a meal. 05/01/24  Yes Crain, Whitney L, PA  imatinib  (GLEEVEC ) 400 MG tablet TAKE 1 TABLET BY MOUTH 1 TIME A DAY TAKE WITH MEALS AND LARGE GLASS OF WATER 03/29/24  Yes Marlene Simas, MD  Magnesium 200 MG TABS Take 200 mg by mouth daily.   Yes [provider]  sildenafil  (VIAGRA ) 50 MG tablet 1-2 tabs po qd prn 04/13/24   McGowen, Minetta Aly, MD     Positive ROS: All other systems have been reviewed and were otherwise negative with the exception of those mentioned in the HPI and as above.  Physical Exam: General: Alert, no acute distress Cardiovascular: No pedal edema Respiratory: No cyanosis, no use of accessory musculature GI: No organomegaly, abdomen is soft and non-tender Skin: No lesions in the area of chief complaint Neurologic: Sensation intact distally Psychiatric: Patient is competent for consent with normal mood and affect Lymphatic: No axillary or cervical lymphadenopathy  MUSCULOSKELETAL: On examination today the left quadriceps has a palpable gap superiorly. He is unable to do a straight leg raise. He has ecchymosis, swelling and  bruising going down the leg as well.  MRI is reviewed which demonstrates a quadriceps rupture. There are some fibers still intact but there is a significant amount of quad which is detached.   Assessment: Left quadriceps rupture    Plan: Plan for Procedure(s): REPAIR, TENDON, QUADRICEPS  The risks benefits and alternatives were discussed with the patient including but not limited to the risks of nonoperative treatment, versus surgical intervention including infection, bleeding, nerve injury,  blood clots, cardiopulmonary complications, morbidity, mortality, among others, and they were willing to proceed.  Abraham Hoffmann, PA-C    05/07/2024 12:52 PM

## 2024-05-07 NOTE — Progress Notes (Signed)

## 2024-05-08 ENCOUNTER — Encounter (HOSPITAL_BASED_OUTPATIENT_CLINIC_OR_DEPARTMENT_OTHER)
Admission: RE | Disposition: A | Discharge status: Home / Self Care | Payer: Self-pay | Source: Home / Self Care | Attending: Orthopedic Surgery

## 2024-05-08 ENCOUNTER — Other Ambulatory Visit: Payer: Self-pay

## 2024-05-08 ENCOUNTER — Ambulatory Visit (HOSPITAL_BASED_OUTPATIENT_CLINIC_OR_DEPARTMENT_OTHER)
Admission: RE | Admit: 2024-05-08 | Discharge: 2024-05-08 | Disposition: A | Discharge status: Home / Self Care | Attending: Orthopedic Surgery | Admitting: Orthopedic Surgery

## 2024-05-08 ENCOUNTER — Ambulatory Visit (HOSPITAL_BASED_OUTPATIENT_CLINIC_OR_DEPARTMENT_OTHER): Payer: Self-pay | Admitting: Anesthesiology

## 2024-05-08 ENCOUNTER — Encounter (HOSPITAL_BASED_OUTPATIENT_CLINIC_OR_DEPARTMENT_OTHER): Payer: Self-pay | Admitting: Orthopedic Surgery

## 2024-05-08 DIAGNOSIS — S76112A Strain of left quadriceps muscle, fascia and tendon, initial encounter: Secondary | ICD-10-CM | POA: Insufficient documentation

## 2024-05-08 DIAGNOSIS — E8881 Metabolic syndrome: Secondary | ICD-10-CM | POA: Diagnosis not present

## 2024-05-08 DIAGNOSIS — W010XXA Fall on same level from slipping, tripping and stumbling without subsequent striking against object, initial encounter: Secondary | ICD-10-CM | POA: Insufficient documentation

## 2024-05-08 DIAGNOSIS — I10 Essential (primary) hypertension: Secondary | ICD-10-CM

## 2024-05-08 DIAGNOSIS — S83412A Sprain of medial collateral ligament of left knee, initial encounter: Secondary | ICD-10-CM | POA: Diagnosis not present

## 2024-05-08 DIAGNOSIS — N182 Chronic kidney disease, stage 2 (mild): Secondary | ICD-10-CM | POA: Diagnosis not present

## 2024-05-08 DIAGNOSIS — S83422A Sprain of lateral collateral ligament of left knee, initial encounter: Secondary | ICD-10-CM | POA: Diagnosis not present

## 2024-05-08 DIAGNOSIS — G8918 Other acute postprocedural pain: Secondary | ICD-10-CM | POA: Diagnosis not present

## 2024-05-08 DIAGNOSIS — Z79899 Other long term (current) drug therapy: Secondary | ICD-10-CM | POA: Diagnosis not present

## 2024-05-08 DIAGNOSIS — S76102A Unspecified injury of left quadriceps muscle, fascia and tendon, initial encounter: Secondary | ICD-10-CM | POA: Diagnosis not present

## 2024-05-08 DIAGNOSIS — I129 Hypertensive chronic kidney disease with stage 1 through stage 4 chronic kidney disease, or unspecified chronic kidney disease: Secondary | ICD-10-CM | POA: Insufficient documentation

## 2024-05-08 HISTORY — PX: QUADRICEPS TENDON REPAIR: SHX756

## 2024-05-08 SURGERY — REPAIR, TENDON, QUADRICEPS
Anesthesia: General | Site: Leg Upper | Laterality: Left

## 2024-05-08 MED ORDER — FENTANYL CITRATE (PF) 100 MCG/2ML IJ SOLN
INTRAMUSCULAR | Status: AC
  Filled 2024-05-08: qty 2

## 2024-05-08 MED ORDER — LIDOCAINE 2% (20 MG/ML) 5 ML SYRINGE
INTRAMUSCULAR | Status: AC
  Filled 2024-05-08: qty 5

## 2024-05-08 MED ORDER — POVIDONE-IODINE 7.5 % EX SOLN
Freq: Once | CUTANEOUS | Status: AC
  Filled 2024-05-08: qty 118

## 2024-05-08 MED ORDER — FENTANYL CITRATE (PF) 100 MCG/2ML IJ SOLN
100.0000 ug | Freq: Once | INTRAMUSCULAR | Status: AC
  Administered 2024-05-08: 50 ug via INTRAVENOUS

## 2024-05-08 MED ORDER — ONDANSETRON HCL 4 MG PO TABS: 4.0000 mg | ORAL_TABLET | Freq: Three times a day (TID) | ORAL | 0 refills | Status: DC | PRN

## 2024-05-08 MED ORDER — AMISULPRIDE (ANTIEMETIC) 5 MG/2ML IV SOLN: 10.0000 mg | Freq: Once | INTRAVENOUS | Status: DC | PRN

## 2024-05-08 MED ORDER — PROPOFOL 10 MG/ML IV BOLUS
INTRAVENOUS | Status: DC | PRN
  Administered 2024-05-08: 200 mg via INTRAVENOUS

## 2024-05-08 MED ORDER — ROPIVACAINE HCL 5 MG/ML IJ SOLN
INTRAMUSCULAR | Status: DC | PRN
Start: 2024-05-08 — End: 2024-05-08
  Administered 2024-05-08: 25 mL via PERINEURAL

## 2024-05-08 MED ORDER — ONDANSETRON HCL 4 MG/2ML IJ SOLN
INTRAMUSCULAR | Status: DC | PRN
  Administered 2024-05-08: 4 mg via INTRAVENOUS

## 2024-05-08 MED ORDER — DEXAMETHASONE SODIUM PHOSPHATE 10 MG/ML IJ SOLN
INTRAMUSCULAR | Status: DC | PRN
  Administered 2024-05-08: 8 mg via INTRAVENOUS

## 2024-05-08 MED ORDER — OXYCODONE HCL 5 MG PO TABS: 5.0000 mg | ORAL_TABLET | ORAL | 0 refills | Status: DC | PRN

## 2024-05-08 MED ORDER — HYDROMORPHONE HCL 1 MG/ML IJ SOLN
0.2500 mg | INTRAMUSCULAR | Status: DC | PRN
  Administered 2024-05-08: 0.5 mg via INTRAVENOUS

## 2024-05-08 MED ORDER — FENTANYL CITRATE (PF) 100 MCG/2ML IJ SOLN
INTRAMUSCULAR | Status: DC | PRN
  Administered 2024-05-08: 50 ug via INTRAVENOUS
  Administered 2024-05-08: 25 ug via INTRAVENOUS

## 2024-05-08 MED ORDER — ACETAMINOPHEN 500 MG PO TABS
1000.0000 mg | ORAL_TABLET | Freq: Three times a day (TID) | ORAL | 0 refills | Status: AC
Start: 2024-05-08 — End: 2024-05-18

## 2024-05-08 MED ORDER — 0.9 % SODIUM CHLORIDE (POUR BTL) OPTIME
TOPICAL | Status: DC | PRN
  Administered 2024-05-08: 1000 mL

## 2024-05-08 MED ORDER — HYDROMORPHONE HCL 1 MG/ML IJ SOLN
INTRAMUSCULAR | Status: AC
  Filled 2024-05-08: qty 0.5

## 2024-05-08 MED ORDER — DEXAMETHASONE SODIUM PHOSPHATE 10 MG/ML IJ SOLN
INTRAMUSCULAR | Status: AC
  Filled 2024-05-08: qty 1

## 2024-05-08 MED ORDER — LIDOCAINE 2% (20 MG/ML) 5 ML SYRINGE
INTRAMUSCULAR | Status: DC | PRN
  Administered 2024-05-08: 100 mg via INTRAVENOUS

## 2024-05-08 MED ORDER — CEFAZOLIN SODIUM-DEXTROSE 2-4 GM/100ML-% IV SOLN
INTRAVENOUS | Status: AC
  Filled 2024-05-08: qty 100

## 2024-05-08 MED ORDER — PROPOFOL 10 MG/ML IV BOLUS
INTRAVENOUS | Status: AC
  Filled 2024-05-08: qty 20

## 2024-05-08 MED ORDER — POVIDONE-IODINE 10 % EX SWAB
2.0000 | Freq: Once | CUTANEOUS | Status: AC
  Administered 2024-05-08: 2 via TOPICAL

## 2024-05-08 MED ORDER — ONDANSETRON HCL 4 MG/2ML IJ SOLN
INTRAMUSCULAR | Status: AC
  Filled 2024-05-08: qty 2

## 2024-05-08 MED ORDER — ACETAMINOPHEN 500 MG PO TABS
1000.0000 mg | ORAL_TABLET | Freq: Once | ORAL | Status: AC
  Administered 2024-05-08: 1000 mg via ORAL

## 2024-05-08 MED ORDER — CEFAZOLIN SODIUM-DEXTROSE 2-4 GM/100ML-% IV SOLN
2.0000 g | INTRAVENOUS | Status: AC
  Administered 2024-05-08: 2 g via INTRAVENOUS

## 2024-05-08 MED ORDER — OXYCODONE HCL 5 MG PO TABS: 5.0000 mg | ORAL_TABLET | Freq: Once | ORAL | Status: DC | PRN

## 2024-05-08 MED ORDER — BACLOFEN 10 MG PO TABS: 10.0000 mg | ORAL_TABLET | Freq: Three times a day (TID) | ORAL | 0 refills | Status: DC

## 2024-05-08 MED ORDER — ACETAMINOPHEN 500 MG PO TABS
ORAL_TABLET | ORAL | Status: AC
  Filled 2024-05-08: qty 2

## 2024-05-08 MED ORDER — MIDAZOLAM HCL 2 MG/2ML IJ SOLN
INTRAMUSCULAR | Status: AC
  Filled 2024-05-08: qty 2

## 2024-05-08 MED ORDER — LACTATED RINGERS IV SOLN: INTRAVENOUS | Status: DC

## 2024-05-08 MED ORDER — SENNA-DOCUSATE SODIUM 8.6-50 MG PO TABS: 2.0000 | ORAL_TABLET | Freq: Every day | ORAL | 1 refills | Status: DC

## 2024-05-08 MED ORDER — OXYCODONE HCL 5 MG/5ML PO SOLN: 5.0000 mg | Freq: Once | ORAL | Status: DC | PRN

## 2024-05-08 MED ORDER — MIDAZOLAM HCL 2 MG/2ML IJ SOLN
2.0000 mg | Freq: Once | INTRAMUSCULAR | Status: AC
  Administered 2024-05-08: 2 mg via INTRAVENOUS

## 2024-05-08 SURGICAL SUPPLY — 49 items
BAG DECANTER FOR FLEXI CONT (MISCELLANEOUS) IMPLANT
BANDAGE ESMARK 6X9 LF (GAUZE/BANDAGES/DRESSINGS) ×1 IMPLANT
BLADE SURG 15 STRL LF DISP TIS (BLADE) ×2 IMPLANT
BNDG ELASTIC 4INX 5YD STR LF (GAUZE/BANDAGES/DRESSINGS) ×1 IMPLANT
BNDG ELASTIC 6INX 5YD STR LF (GAUZE/BANDAGES/DRESSINGS) ×1 IMPLANT
CANISTER SUCT 1200ML W/VALVE (MISCELLANEOUS) IMPLANT
CLSR STERI-STRIP ANTIMIC 1/2X4 (GAUZE/BANDAGES/DRESSINGS) ×1 IMPLANT
DRAPE EXTREMITY T 121X128X90 (DISPOSABLE) ×1 IMPLANT
DRAPE U-SHAPE 47X51 STRL (DRAPES) ×1 IMPLANT
DURAPREP 26ML APPLICATOR (WOUND CARE) ×1 IMPLANT
ELECTRODE REM PT RTRN 9FT ADLT (ELECTROSURGICAL) ×1 IMPLANT
GAUZE PAD ABD 8X10 STRL (GAUZE/BANDAGES/DRESSINGS) IMPLANT
GAUZE SPONGE 4X4 12PLY STRL (GAUZE/BANDAGES/DRESSINGS) ×1 IMPLANT
GAUZE XEROFORM 1X8 LF (GAUZE/BANDAGES/DRESSINGS) IMPLANT
GLOVE BIO SURGEON STRL SZ7 (GLOVE) ×1 IMPLANT
GLOVE BIOGEL PI IND STRL 7.0 (GLOVE) ×1 IMPLANT
GLOVE BIOGEL PI IND STRL 8 (GLOVE) ×2 IMPLANT
GLOVE ORTHO TXT STRL SZ7.5 (GLOVE) ×1 IMPLANT
GOWN STRL REUS W/ TWL LRG LVL3 (GOWN DISPOSABLE) ×1 IMPLANT
GOWN STRL REUS W/ TWL XL LVL3 (GOWN DISPOSABLE) ×2 IMPLANT
MANIFOLD NEPTUNE II (INSTRUMENTS) IMPLANT
NDL KEITH (NEEDLE) ×1 IMPLANT
NDL SUT 6 .5 CRC .975X.05 MAYO (NEEDLE) ×1 IMPLANT
NEEDLE KEITH (NEEDLE) ×1 IMPLANT
NS IRRIG 1000ML POUR BTL (IV SOLUTION) ×1 IMPLANT
PACK ARTHROSCOPY DSU (CUSTOM PROCEDURE TRAY) ×1 IMPLANT
PACK BASIN DAY SURGERY FS (CUSTOM PROCEDURE TRAY) ×1 IMPLANT
PADDING CAST COTTON 6X4 STRL (CAST SUPPLIES) ×1 IMPLANT
PASSER SUT SWANSON 36MM LOOP (INSTRUMENTS) IMPLANT
PENCIL SMOKE EVACUATOR (MISCELLANEOUS) ×1 IMPLANT
SHEET MEDIUM DRAPE 40X70 STRL (DRAPES) ×1 IMPLANT
SLEEVE SCD COMPRESS KNEE MED (STOCKING) ×1 IMPLANT
SPIKE FLUID TRANSFER (MISCELLANEOUS) IMPLANT
SPLINT PLASTER CAST FAST 5X30 (CAST SUPPLIES) IMPLANT
SPONGE T-LAP 18X18 ~~LOC~~+RFID (SPONGE) ×1 IMPLANT
SUCTION TUBE FRAZIER 10FR DISP (SUCTIONS) IMPLANT
SUT MNCRL AB 4-0 PS2 18 (SUTURE) IMPLANT
SUT VIC AB 0 CT1 27XBRD ANBCTR (SUTURE) ×1 IMPLANT
SUT VIC AB 2-0 CT1 TAPERPNT 27 (SUTURE) IMPLANT
SUT VIC AB 2-0 SH 27XBRD (SUTURE) IMPLANT
SUT VIC AB 3-0 SH 27X BRD (SUTURE) IMPLANT
SUT VICRYL 0 SH 27 (SUTURE) IMPLANT
SUT VICRYL 3-0 CR8 SH (SUTURE) ×1 IMPLANT
SUTURE FIBERWR #2 38 T-5 BLUE (SUTURE) IMPLANT
SUTURE TAPE 1.3 40 TPR END (SUTURE) ×1 IMPLANT
SYR BULB EAR ULCER 3OZ GRN STR (SYRINGE) ×1 IMPLANT
TOWEL GREEN STERILE FF (TOWEL DISPOSABLE) ×2 IMPLANT
UNDERPAD 30X36 HEAVY ABSORB (UNDERPADS AND DIAPERS) ×1 IMPLANT
YANKAUER SUCT BULB TIP NO VENT (SUCTIONS) ×1 IMPLANT

## 2024-05-08 NOTE — Transfer of Care (Signed)
 Immediate Anesthesia Transfer of Care Note  Patient: Michael [name]  Procedure(s) Performed: REPAIR, TENDON, QUADRICEPS (Left: Leg Upper)  Patient Location: PACU  Anesthesia Type:General and Regional  Level of Consciousness: drowsy  Airway & Oxygen Therapy: Patient Spontanous Breathing and Patient connected to face mask oxygen  Post-op Assessment: Report given to RN and Post -op Vital signs reviewed and stable  Post vital signs: Reviewed and stable  Last Vitals:  Vitals Value Taken Time  BP 133/89 05/08/24 1603  Temp    Pulse 72 05/08/24 1605  Resp 14 05/08/24 1605  SpO2 100 % 05/08/24 1605  Vitals shown include unfiled device data.  Last Pain:  Vitals:   05/08/24 1247  TempSrc: Temporal  PainSc: 2       Patients Stated Pain Goal: 5 (05/08/24 1247)  Complications: No notable events documented.

## 2024-05-08 NOTE — Anesthesia Procedure Notes (Signed)
 Anesthesia Regional Block: Femoral nerve block   Pre-Anesthetic Checklist: , timeout performed,  Correct Patient, Correct Site, Correct Laterality,  Correct Procedure, Correct Position, site marked,  Risks and benefits discussed,  Surgical consent,  Pre-op evaluation,  At surgeon's request and post-op pain management  Laterality: Left  Prep: chloraprep       Needles:  Injection technique: Single-shot  Needle Type: Echogenic Stimulator Needle     Needle Length: 9cm  Needle Gauge: 21     Additional Needles:   Procedures:,,,, ultrasound used (permanent image in chart),,    Narrative:  Start time: 05/08/2024 1:23 PM End time: 05/08/2024 1:28 PM Injection made incrementally with aspirations every 5 mL.  Performed by: Personally  Anesthesiologist: Conard Decent, MD  Additional Notes: Discussed risks and benefits of nerve block including, but not limited to, prolonged and/or permanent nerve injury involving sensory and/or motor function. Monitors were applied and a time-out was performed. The nerve and associated structures were visualized under ultrasound guidance. After negative aspiration, local anesthetic was slowly injected around the nerve. There was no evidence of high pressure during the procedure. There were no paresthesias. VSS remained stable and the patient tolerated the procedure well.

## 2024-05-08 NOTE — Discharge Instructions (Addendum)
 No Tylenol  until 7 p.m.  Diet: As you were doing prior to hospitalization   Shower:  May shower but keep the wounds dry, use an occlusive plastic wrap, NO SOAKING IN TUB.  If the bandage gets wet, change with a clean dry gauze.  If you have a splint on, leave the splint in place and keep the splint dry with a plastic bag.  Dressing:  You may change your dressing 3-5 days after surgery, unless you have a splint.  If you have a splint, then just leave the splint in place and we will change your bandages during your first follow-up appointment.    If you had hand or foot surgery, we will plan to remove your stitches in about 2 weeks in the office.  For all other surgeries, there are sticky tapes (steri-strips) on your wounds and all the stitches are absorbable.  Leave the steri-strips in place when changing your dressings, they will peel off with time, usually 2-3 weeks.  Activity:  Increase activity slowly as tolerated, but follow the weight bearing instructions below.  The rules on driving is that you can not be taking narcotics while you drive, and you must feel in control of the vehicle.    Weight Bearing:   Weight bearing as tolerated on left leg in splint.    To prevent constipation: you may use a stool softener such as -  Colace (over the counter) 100 mg by mouth twice a day  Drink plenty of fluids (prune juice may be helpful) and high fiber foods Miralax (over the counter) for constipation as needed.    Itching:  If you experience itching with your medications, try taking only a single pain pill, or even half a pain pill at a time.  You may take up to 10 pain pills per day, and you can also use benadryl over the counter for itching or also to help with sleep.   Precautions:  If you experience chest pain or shortness of breath - call 911 immediately for transfer to the hospital emergency department!!  If you develop a fever greater that 101 F, purulent drainage from wound, increased  redness or drainage from wound, or calf pain -- Call the office at [phone]                                                Follow- Up Appointment:  Please call for an appointment to be seen in 2 weeks [place] - [phone]   Post Anesthesia Home Care Instructions  Activity: Get plenty of rest for the remainder of the day. A responsible individual must stay with you for 24 hours following the procedure.  For the next 24 hours, DO NOT: -Drive a car -Advertising copywriter -Drink alcoholic beverages -Take any medication unless instructed by your physician -Make any legal decisions or sign important papers.  Meals: Start with liquid foods such as gelatin or soup. Progress to regular foods as tolerated. Avoid greasy, spicy, heavy foods. If nausea and/or vomiting occur, drink only clear liquids until the nausea and/or vomiting subsides. Call your physician if vomiting continues.  Special Instructions/Symptoms: Your throat may feel dry or sore from the anesthesia or the breathing tube placed in your throat during surgery. If this causes discomfort, gargle with warm salt water. The discomfort should disappear within 24 hours.  If you had  a scopolamine patch placed behind your ear for the management of post- operative nausea and/or vomiting:  1. The medication in the patch is effective for 72 hours, after which it should be removed.  Wrap patch in a tissue and discard in the trash. Wash hands thoroughly with soap and water. 2. You may remove the patch earlier than 72 hours if you experience unpleasant side effects which may include dry mouth, dizziness or visual disturbances. 3. Avoid touching the patch. Wash your hands with soap and water after contact with the patch.   Regional Anesthesia Blocks  1. You may not be able to move or feel the "blocked" extremity after a regional anesthetic block. This may last may last from 3-48 hours after placement, but it will go away. The length of time  depends on the medication injected and your individual response to the medication. As the nerves start to wake up, you may experience tingling as the movement and feeling returns to your extremity. If the numbness and inability to move your extremity has not gone away after 48 hours, please call your surgeon.   2. The extremity that is blocked will need to be protected until the numbness is gone and the strength has returned. Because you cannot feel it, you will need to take extra care to avoid injury. Because it may be weak, you may have difficulty moving it or using it. You may not know what position it is in without looking at it while the block is in effect.  3. For blocks in the legs and feet, returning to weight bearing and walking needs to be done carefully. You will need to wait until the numbness is entirely gone and the strength has returned. You should be able to move your leg and foot normally before you try and bear weight or walk. You will need someone to be with you when you first try to ensure you do not fall and possibly risk injury.  4. Bruising and tenderness at the needle site are common side effects and will resolve in a few days.  5. Persistent numbness or new problems with movement should be communicated to the surgeon or the [facility] [phone] [facility] [phone]).

## 2024-05-08 NOTE — Anesthesia Postprocedure Evaluation (Signed)
 Anesthesia Post Note  Patient: Michael [name]  Procedure(s) Performed: REPAIR, TENDON, QUADRICEPS (Left: Leg Upper)     Patient location during evaluation: PACU Anesthesia Type: General Level of consciousness: awake Pain management: pain level controlled Vital Signs Assessment: post-procedure vital signs reviewed and stable Respiratory status: spontaneous breathing, nonlabored ventilation and respiratory function stable Cardiovascular status: blood pressure returned to baseline and stable Postop Assessment: no apparent nausea or vomiting Anesthetic complications: no   No notable events documented.  Last Vitals:  Vitals:   05/08/24 1604 05/08/24 1630  BP:  133/83  Pulse: 80 69  Resp: 18 11  Temp:    SpO2: 100% 95%    Last Pain:  Vitals:   05/08/24 1630  TempSrc:   PainSc: 5                  Conard Decent

## 2024-05-08 NOTE — Op Note (Signed)
 05/08/2024  3:36 PM  PATIENT:  Michael [name]    PRE-OPERATIVE DIAGNOSIS:  left quadriceps tendon rupture  POST-OPERATIVE DIAGNOSIS:  Same  PROCEDURE:  REPAIR, TENDON, QUADRICEPS of medial capsular structures of the knee  SURGEON:  Neville Barbone, MD  PHYSICIAN ASSISTANT: Hurshel Maidens, PA-C, present and scrubbed throughout the case, critical for completion in a timely fashion, and for retraction, instrumentation, and closure.  ANESTHESIA:   General  PREOPERATIVE INDICATIONS:  [name] is a  58 y.o. male with a diagnosis of left quadriceps tendon rupture who failed conservative measures and elected for surgical management.    The risks benefits and alternatives were discussed with the patient preoperatively including but not limited to the risks of infection, bleeding, nerve injury, cardiopulmonary complications, the need for revision surgery, among others, and the patient was willing to proceed.  ESTIMATED BLOOD LOSS: Minimal  OPERATIVE IMPLANTS:   * No implants in log *  OPERATIVE FINDINGS: There is a gaping hole in the lateral half of the quadriceps, but the medial half of the quadriceps attachment was also quite diminutive, and poor quality tissue, not much in the way of crossing soft tissue strands.  I took this down and repaired the entirety of the quadriceps.  OPERATIVE PROCEDURE: The patient was brought to the operating room and placed in the supine position.  General anesthesia was administered.  IV antibiotics were given.  The left lower extremity was prepped and draped in usual sterile fashion.  Timeout performed.  The leg was elevated and exsanguinated and the tourniquet was inflated.  Anterior approach to the knee was carried out, dissection carried down, the quadriceps tendon was completely disrupted on the lateral half of the attachment, and acutely disrupted on the medial side.  I dissected the tissue planes cleanly, and then used a total of two #2 FiberWire  in a Krakw suture configuration and down the quadriceps tendon.  I then placed a total of 3 drill holes through the patella exiting distally, a Keith needle to pass the FiberWire sutures proximal to distal.  I used a free needle to deliver the central limbs to the lateral side, to minimize soft tissue bridge.    I brought the knee into full extension, tensioning the sutures, and just delivered the quadriceps directly to the bony bed of the patella.  I already prepared this with the rongeur.  I then tied the FiberWire sutures, and had excellent apposition of tendon to bone.  Using interrupted figure-of-eight 0 Vicryl sutures I repaired the medial and lateral capsule of the knee.  I augmented the repair with additional Vicryl sutures in a figure-of-eight fashion across the anterior soft tissue plane.  I irrigated the wounds copiously, repaired the subcutaneous tissue with Vicryl followed by routine closure for the skin, with Steri-Strips and sterile gauze, long-leg posterior splint was.  He was awakened and returned to the PACU in stable condition.  There were no complications and he tolerated the procedure well.

## 2024-05-08 NOTE — Progress Notes (Signed)
 Assisted Dr. Janyth Meres with left, femoral, ultrasound guided block. Side rails up, monitors on throughout procedure. See vital signs in flow sheet. Tolerated Procedure well.

## 2024-05-08 NOTE — Anesthesia Procedure Notes (Signed)
 Procedure Name: LMA Insertion Date/Time: 05/08/2024 2:35 PM  Performed by: Glo Larch, CRNAPre-anesthesia Checklist: Patient identified, Emergency Drugs available, Suction available and Patient being monitored Patient Re-evaluated:Patient Re-evaluated prior to induction Oxygen Delivery Method: Circle system utilized Preoxygenation: Pre-oxygenation with 100% oxygen Induction Type: IV induction Ventilation: Mask ventilation without difficulty LMA: LMA inserted LMA Size: 5.0 Number of attempts: 1 Airway Equipment and Method: Bite block Placement Confirmation: positive ETCO2 Tube secured with: Tape Dental Injury: Teeth and Oropharynx as per pre-operative assessment

## 2024-05-08 NOTE — Interval H&P Note (Signed)
 History and Physical Interval Note:  05/08/2024 2:24 PM  Michael [name]  has presented today for surgery, with the diagnosis of left quadriceps tendon rupture.  The various methods of treatment have been discussed with the patient and family. After consideration of risks, benefits and other options for treatment, the patient has consented to  Procedure(s): REPAIR, TENDON, QUADRICEPS (Left) as a surgical intervention.  The patient's history has been reviewed, patient examined, no change in status, stable for surgery.  I have reviewed the patient's chart and labs.  Questions were answered to the patient's satisfaction.     Neville Barbone

## 2024-05-08 NOTE — Anesthesia Preprocedure Evaluation (Addendum)
 Anesthesia Evaluation  Patient identified by MRN, date of birth, ID band Patient awake    Reviewed: Allergy & Precautions, NPO status , Patient's Chart, lab work & pertinent test results  History of Anesthesia Complications Negative for: history of anesthetic complications  Airway Mallampati: II  TM Distance: >3 FB Neck ROM: Full    Dental  (+) Dental Advisory Given   Pulmonary neg pulmonary ROS   Pulmonary exam normal breath sounds clear to auscultation       Cardiovascular hypertension (amlodipine -benazepril ), Pt. on medications (-) angina (-) Past MI, (-) Cardiac Stents and (-) CABG (-) dysrhythmias  Rhythm:Regular Rate:Normal     Neuro/Psych neg Seizures  Neuromuscular disease (lumbar radiculopathy, herniated cervical disc)    GI/Hepatic negative GI ROS,,,Cyst   Endo/Other  neg diabetes  Metabolic syndrome  Renal/GU Renal InsufficiencyRenal disease   Prostate cancer    Musculoskeletal   Abdominal   Peds  Hematology negative hematology ROS (+) CML in remission   Anesthesia Other Findings   Reproductive/Obstetrics                             Anesthesia Physical Anesthesia Plan  ASA: 2  Anesthesia Plan: General   Post-op Pain Management: Tylenol  PO (pre-op)* and Regional block*   Induction: Intravenous  PONV Risk Score and Plan: 2 and Ondansetron, Dexamethasone, Treatment may vary due to age or medical condition and Midazolam  Airway Management Planned: LMA  Additional Equipment:   Intra-op Plan:   Post-operative Plan: Extubation in OR  Informed Consent: I have reviewed the patients History and Physical, chart, labs and discussed the procedure including the risks, benefits and alternatives for the proposed anesthesia with the patient or authorized representative who has indicated his/her understanding and acceptance.     Dental advisory given  Plan Discussed with:  CRNA and Anesthesiologist  Anesthesia Plan Comments: (Risks of general anesthesia discussed including, but not limited to, sore throat, hoarse voice, chipped/damaged teeth, injury to vocal cords, nausea and vomiting, allergic reactions, lung infection, heart attack, stroke, and death. All questions answered. )        Anesthesia Quick Evaluation

## 2024-05-09 ENCOUNTER — Encounter (HOSPITAL_BASED_OUTPATIENT_CLINIC_OR_DEPARTMENT_OTHER): Payer: Self-pay | Admitting: Orthopedic Surgery

## 2024-05-18 NOTE — Progress Notes (Signed)
 Patient is under active surveillance and is scheduled to follow up with urology on 08/01/2024.

## 2024-05-23 DIAGNOSIS — S76102A Unspecified injury of left quadriceps muscle, fascia and tendon, initial encounter: Secondary | ICD-10-CM | POA: Diagnosis not present

## 2024-05-25 ENCOUNTER — Telehealth: Payer: Self-pay

## 2024-05-25 NOTE — Telephone Encounter (Signed)
 Copied from CRM [id number]. Topic: General - Other >> May 25, 2024  9:15 AM Howard Macho wrote: Reason for CRM: [employer] Eagles from [employer] called stating the patient MRI was denied from 5/13 and the prior authorization was never received  CB [phone]   Please advise if anything to be completed, patient has already completed MRI

## 2024-05-29 NOTE — Telephone Encounter (Signed)
 FYI noted below.

## 2024-05-31 NOTE — Telephone Encounter (Signed)
 Communication  Reason for CRM: have a claim for 05/13 for a MRI, need to get clinical information consent for the review , it has been denied because needed prior auth        fax number : [phone]    temp auth number for cover sheet  [phone]    Please send as soon as possible

## 2024-06-01 NOTE — Telephone Encounter (Signed)
 FYI reviewed

## 2024-06-12 DIAGNOSIS — S76102A Unspecified injury of left quadriceps muscle, fascia and tendon, initial encounter: Secondary | ICD-10-CM | POA: Diagnosis not present

## 2024-06-25 DIAGNOSIS — M79652 Pain in left thigh: Secondary | ICD-10-CM | POA: Diagnosis not present

## 2024-06-26 ENCOUNTER — Telehealth: Payer: Self-pay | Admitting: Medical Oncology

## 2024-06-26 ENCOUNTER — Encounter: Payer: Self-pay | Admitting: Medical Oncology

## 2024-06-26 NOTE — Telephone Encounter (Signed)
 PA required for Imatinib ,  insurance request. I called Cover my meds  Tory. She will fax PA form after it has been reviewed by Cover my meds.

## 2024-06-27 ENCOUNTER — Telehealth: Payer: Self-pay

## 2024-06-27 ENCOUNTER — Telehealth: Payer: Self-pay | Admitting: Medical Oncology

## 2024-06-27 ENCOUNTER — Other Ambulatory Visit (HOSPITAL_COMMUNITY): Payer: Self-pay

## 2024-06-27 DIAGNOSIS — M79652 Pain in left thigh: Secondary | ICD-10-CM | POA: Diagnosis not present

## 2024-06-27 NOTE — Telephone Encounter (Addendum)
 Oral Oncology Patient Advocate Encounter   Received notification that prior authorization for  Imatinib  is due for renewal.   PA submitted on 06/27/24 Key: [id number] Status is pending      Charlott Hamilton,  CPhT-Adv  she/her/hers [facility]  [facility] Pharmacy Technician Patient Advocate Specialist III [facility] Phone: [phone]  Fax: [phone] [email or web address]

## 2024-06-27 NOTE — Telephone Encounter (Signed)
 Clinical information given for PA for imatinib  refills.

## 2024-06-29 ENCOUNTER — Other Ambulatory Visit (HOSPITAL_COMMUNITY): Payer: Self-pay

## 2024-06-29 DIAGNOSIS — M79652 Pain in left thigh: Secondary | ICD-10-CM | POA: Diagnosis not present

## 2024-06-29 NOTE — Telephone Encounter (Signed)
 Oral Oncology Patient Advocate Encounter  Prior Authorization for Imatinib  has been approved.    Key: [id number] Effective dates: 06/27/24 through 06/27/25  Patients must fill at [facility].   Charlott Hamilton,  CPhT-Adv  she/her/hers [facility]  [facility] Pharmacy Technician Patient Advocate Specialist III [facility] Phone: [phone]  Fax: [phone] [email or web address]

## 2024-07-02 DIAGNOSIS — M79652 Pain in left thigh: Secondary | ICD-10-CM | POA: Diagnosis not present

## 2024-07-06 ENCOUNTER — Telehealth: Payer: Self-pay | Admitting: Internal Medicine

## 2024-07-06 NOTE — Telephone Encounter (Signed)
 Called to reschedule patient appointment to due provider pal  request. I talked  to patient and they are aware of the changes that was made to the upcoming appointment

## 2024-07-09 DIAGNOSIS — M79652 Pain in left thigh: Secondary | ICD-10-CM | POA: Diagnosis not present

## 2024-07-10 NOTE — Progress Notes (Signed)
 RN spoke with patient and reviewed upcoming MRI followed by urology follow up on 08/01/24.  All questions answered, no additional needs at this time.

## 2024-07-13 DIAGNOSIS — M79652 Pain in left thigh: Secondary | ICD-10-CM | POA: Diagnosis not present

## 2024-07-16 DIAGNOSIS — M79652 Pain in left thigh: Secondary | ICD-10-CM | POA: Diagnosis not present

## 2024-07-17 ENCOUNTER — Ambulatory Visit
Admission: RE | Admit: 2024-07-17 | Discharge: 2024-07-17 | Disposition: A | Discharge status: Home / Self Care | Source: Ambulatory Visit | Attending: Urology | Admitting: Urology

## 2024-07-17 DIAGNOSIS — C61 Malignant neoplasm of prostate: Secondary | ICD-10-CM | POA: Diagnosis not present

## 2024-07-17 MED ORDER — GADOPICLENOL 0.5 MMOL/ML IV SOLN
9.0000 mL | Freq: Once | INTRAVENOUS | Status: AC | PRN
  Administered 2024-07-17: 9 mL via INTRAVENOUS

## 2024-07-19 DIAGNOSIS — M79652 Pain in left thigh: Secondary | ICD-10-CM | POA: Diagnosis not present

## 2024-07-23 DIAGNOSIS — S76102D Unspecified injury of left quadriceps muscle, fascia and tendon, subsequent encounter: Secondary | ICD-10-CM | POA: Diagnosis not present

## 2024-07-24 DIAGNOSIS — M79652 Pain in left thigh: Secondary | ICD-10-CM | POA: Diagnosis not present

## 2024-07-27 DIAGNOSIS — M79652 Pain in left thigh: Secondary | ICD-10-CM | POA: Diagnosis not present

## 2024-07-30 DIAGNOSIS — M79652 Pain in left thigh: Secondary | ICD-10-CM | POA: Diagnosis not present

## 2024-08-01 DIAGNOSIS — C61 Malignant neoplasm of prostate: Secondary | ICD-10-CM | POA: Diagnosis not present

## 2024-08-01 DIAGNOSIS — M79652 Pain in left thigh: Secondary | ICD-10-CM | POA: Diagnosis not present

## 2024-08-07 LAB — PSA: PSA: 4.6

## 2024-08-13 DIAGNOSIS — M79652 Pain in left thigh: Secondary | ICD-10-CM | POA: Diagnosis not present

## 2024-08-14 NOTE — Progress Notes (Signed)
 Patient is scheduled for prostate biopsy on 10/15/24.

## 2024-08-16 DIAGNOSIS — M79652 Pain in left thigh: Secondary | ICD-10-CM | POA: Diagnosis not present

## 2024-08-23 DIAGNOSIS — M79652 Pain in left thigh: Secondary | ICD-10-CM | POA: Diagnosis not present

## 2024-08-27 DIAGNOSIS — M79652 Pain in left thigh: Secondary | ICD-10-CM | POA: Diagnosis not present

## 2024-08-31 DIAGNOSIS — M79652 Pain in left thigh: Secondary | ICD-10-CM | POA: Diagnosis not present

## 2024-09-03 DIAGNOSIS — M79652 Pain in left thigh: Secondary | ICD-10-CM | POA: Diagnosis not present

## 2024-09-07 DIAGNOSIS — M79652 Pain in left thigh: Secondary | ICD-10-CM | POA: Diagnosis not present

## 2024-09-14 DIAGNOSIS — M79652 Pain in left thigh: Secondary | ICD-10-CM | POA: Diagnosis not present

## 2024-09-17 ENCOUNTER — Telehealth: Payer: Self-pay | Admitting: Family Medicine

## 2024-09-17 MED ORDER — SILDENAFIL CITRATE 50 MG PO TABS: ORAL_TABLET | ORAL | 3 refills | Status: AC

## 2024-09-17 NOTE — Telephone Encounter (Signed)
Sildenafil prescription sent

## 2024-09-17 NOTE — Telephone Encounter (Unsigned)
 Copied from CRM #[id number]. Topic: Clinical - Medication Refill >> Sep 17, 2024 12:05 PM Mercedes MATSU wrote: Medication:  sildenafil  (VIAGRA ) 50 MG tablet   Has the patient contacted their pharmacy? Yes (Agent: If no, request that the patient contact the pharmacy for the refill. If patient does not wish to contact the pharmacy document the reason why and proceed with request.) (Agent: If yes, when and what did the pharmacy advise?)  This is the patient's preferred pharmacy:  [id number] [place] [place] - [address] [place] [address] Phone: [phone] Fax: [phone]   Is this the correct pharmacy for this prescription? Yes If no, delete pharmacy and type the correct one.   Has the prescription been filled recently? Yes  Is the patient out of the medication? Yes  Has the patient been seen for an appointment in the last year OR does the patient have an upcoming appointment? Yes  Can we respond through MyChart? Yes  Agent: Please be advised that Rx refills may take up to 3 business days. We ask that you follow-up with your pharmacy.

## 2024-09-21 DIAGNOSIS — M79652 Pain in left thigh: Secondary | ICD-10-CM | POA: Diagnosis not present

## 2024-09-22 ENCOUNTER — Other Ambulatory Visit: Payer: Self-pay | Admitting: Family Medicine

## 2024-09-28 DIAGNOSIS — M79652 Pain in left thigh: Secondary | ICD-10-CM | POA: Diagnosis not present

## 2024-10-04 ENCOUNTER — Other Ambulatory Visit: Payer: Self-pay | Admitting: Internal Medicine

## 2024-10-04 DIAGNOSIS — C9210 Chronic myeloid leukemia, BCR/ABL-positive, not having achieved remission: Secondary | ICD-10-CM

## 2024-10-08 ENCOUNTER — Other Ambulatory Visit: Payer: Self-pay | Admitting: Medical Genetics

## 2024-10-15 ENCOUNTER — Encounter: Admitting: Family Medicine

## 2024-10-15 DIAGNOSIS — R972 Elevated prostate specific antigen [PSA]: Secondary | ICD-10-CM | POA: Diagnosis not present

## 2024-10-15 DIAGNOSIS — N4232 Atypical small acinar proliferation of prostate: Secondary | ICD-10-CM | POA: Diagnosis not present

## 2024-10-15 DIAGNOSIS — C61 Malignant neoplasm of prostate: Secondary | ICD-10-CM | POA: Diagnosis not present

## 2024-10-17 ENCOUNTER — Other Ambulatory Visit: Payer: Self-pay | Admitting: Family Medicine

## 2024-10-23 ENCOUNTER — Other Ambulatory Visit: Payer: Self-pay

## 2024-10-23 MED ORDER — AMLODIPINE BESY-BENAZEPRIL HCL 5-10 MG PO CAPS: 1.0000 | ORAL_CAPSULE | Freq: Every day | ORAL | 0 refills | Status: DC

## 2024-11-13 ENCOUNTER — Other Ambulatory Visit

## 2024-11-13 ENCOUNTER — Ambulatory Visit: Admitting: Internal Medicine

## 2024-11-21 ENCOUNTER — Other Ambulatory Visit: Payer: Self-pay

## 2024-11-21 DIAGNOSIS — C9210 Chronic myeloid leukemia, BCR/ABL-positive, not having achieved remission: Secondary | ICD-10-CM

## 2024-11-21 DIAGNOSIS — C61 Malignant neoplasm of prostate: Secondary | ICD-10-CM

## 2024-11-22 ENCOUNTER — Inpatient Hospital Stay: Admitting: Internal Medicine

## 2024-11-22 ENCOUNTER — Inpatient Hospital Stay

## 2024-11-22 ENCOUNTER — Inpatient Hospital Stay: Attending: Internal Medicine

## 2024-11-22 VITALS — HR 68 | Temp 98.7°F | Resp 17 | Ht 71.0 in | Wt 203.0 lb

## 2024-11-22 DIAGNOSIS — C9210 Chronic myeloid leukemia, BCR/ABL-positive, not having achieved remission: Secondary | ICD-10-CM | POA: Insufficient documentation

## 2024-11-22 DIAGNOSIS — Z79899 Other long term (current) drug therapy: Secondary | ICD-10-CM | POA: Diagnosis not present

## 2024-11-22 DIAGNOSIS — C61 Malignant neoplasm of prostate: Secondary | ICD-10-CM | POA: Diagnosis not present

## 2024-11-22 LAB — CBC WITH DIFFERENTIAL (CANCER CENTER ONLY)
Abs Immature Granulocytes: 0 K/uL (ref 0.00–0.07)
Basophils Absolute: 0.1 K/uL (ref 0.0–0.1)
Basophils Relative: 1 %
Eosinophils Absolute: 0 K/uL (ref 0.0–0.5)
Eosinophils Relative: 1 %
HCT: 39 % (ref 39.0–52.0)
Hemoglobin: 13.8 g/dL (ref 13.0–17.0)
Immature Granulocytes: 0 %
Lymphocytes Relative: 41 %
Lymphs Abs: 2 K/uL (ref 0.7–4.0)
MCH: 32.7 pg (ref 26.0–34.0)
MCHC: 35.4 g/dL (ref 30.0–36.0)
MCV: 92.4 fL (ref 80.0–100.0)
Monocytes Absolute: 0.4 K/uL (ref 0.1–1.0)
Monocytes Relative: 8 %
Neutro Abs: 2.4 K/uL (ref 1.7–7.7)
Neutrophils Relative %: 49 %
Platelet Count: 169 K/uL (ref 150–400)
RBC: 4.22 MIL/uL (ref 4.22–5.81)
RDW: 12.8 % (ref 11.5–15.5)
WBC Count: 5 K/uL (ref 4.0–10.5)
nRBC: 0 % (ref 0.0–0.2)

## 2024-11-22 LAB — CMP (CANCER CENTER ONLY)
ALT: 42 U/L (ref 0–44)
AST: 42 U/L — ABNORMAL HIGH (ref 15–41)
Albumin: 4.5 g/dL (ref 3.5–5.0)
Alkaline Phosphatase: 92 U/L (ref 38–126)
Anion gap: 9 (ref 5–15)
BUN: 18 mg/dL (ref 6–20)
CO2: 25 mmol/L (ref 22–32)
Calcium: 9 mg/dL (ref 8.9–10.3)
Chloride: 105 mmol/L (ref 98–111)
Creatinine: 1.63 mg/dL — ABNORMAL HIGH (ref 0.61–1.24)
GFR, Estimated: 48 mL/min — ABNORMAL LOW (ref >60)
Glucose, Bld: 97 mg/dL (ref 70–99)
Potassium: 4.2 mmol/L (ref 3.5–5.1)
Sodium: 138 mmol/L (ref 135–145)
Total Bilirubin: 0.4 mg/dL (ref 0.0–1.2)
Total Protein: 7.1 g/dL (ref 6.5–8.1)

## 2024-11-22 LAB — LACTATE DEHYDROGENASE: LDH: 254 U/L — ABNORMAL HIGH (ref 105–235)

## 2024-11-22 MED ORDER — IMATINIB MESYLATE 400 MG PO TABS: 400.0000 mg | ORAL_TABLET | Freq: Every day | ORAL | 11 refills | Status: AC

## 2024-11-22 NOTE — Progress Notes (Signed)
 [facility] [phone]   [phone]  OFFICE PROGRESS NOTE  Candise Aleene DEL, MD [address] 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  2) prostatic adenocarcinoma diagnosed in February 2025 with Gleason score of 6 (3+3) and another area of Gleason score of 7 (3+4) currently on active surveillance by Dr. Renda.   PRIOR THERAPY: None   CURRENT THERAPY: Imatinib  400 mg by mouth daily started February 2009  INTERVAL HISTORY: Michael [name] 58 y.o. male returns to the clinic today for follow-up visit. Discussed the use of AI scribe software for clinical note transcription with the patient, who gave verbal consent to proceed.  History of Present Illness Michael [name] is a 58 year old male with chronic myeloid leukemia who presents for evaluation and repeat blood work.  He was diagnosed with chronic myeloid leukemia in February 2009 and has been on imatinib  400 mg daily since then without any side effects. He has been consistent with his treatment regimen for 16 years.  He has a history of prostate adenocarcinoma, diagnosed in February 2025, with a Gleason score of 6. He is under active surveillance, with the last biopsy allowing continued monitoring. The next evaluation is scheduled for August.  He had surgery on his left knee due to a torn quadriceps tendon after a fall down the stairs.  No side effects from imatinib .     MEDICAL HISTORY: Past Medical History:  Diagnosis Date   Chronic renal insufficiency, stage 2 (mild)    GFR around 60-65 (sCr 1.2-1.3 avg from 2010-2018). Slight inc in sCr to 1.35-1.4 avg 2019-2021.   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  MEDICATIONS:  Current Outpatient Medications  Medication Sig Dispense Refill   amLODipine -benazepril  (LOTREL) 5-10 MG capsule Take 1 capsule by mouth daily. 30 capsule 0   CREATINE MONOHYDRATE PO Take 3,000 mg by mouth every morning.     imatinib  (GLEEVEC ) 400 MG tablet TAKE 1 TABLET BY MOUTH 1 TIME A DAY TAKE WITH MEALS AND LARGE GLASS OF WATER 30 tablet 3   Magnesium 200 MG TABS Take 200 mg by mouth daily.     sildenafil  (VIAGRA ) 50 MG tablet 1-2 tabs po qd prn 180 tablet 3   No current facility-administered medications for this visit.    SURGICAL HISTORY:  Past Surgical History:  Procedure Laterality Date   BONE MARROW BIOPSY  02/2008   COLONOSCOPY  01/09/2018   mild sigmoid diverticulosis, otherwise normal.  Recall 10 yrs.   EYE SURGERY  04/20/1999   Lasik   PROSTATE BIOPSY     QUADRICEPS TENDON REPAIR Left 05/08/2024   Procedure: REPAIR, TENDON, QUADRICEPS;  Surgeon: Josefina Chew, MD;  Location: [facility];  Service: Orthopedics;  Laterality: Left;   REFRACTIVE SURGERY  2000   TOENAIL EXCISION  01/16/2013   ingrown toenail- not removed    TONSILECTOMY, ADENOIDECTOMY, BILATERAL MYRINGOTOMY AND TUBES     age 3   TREATMENT FISTULA ANAL  2015   Anal fistula surgically repaired.    REVIEW OF SYSTEMS:  A comprehensive review of systems was negative.   PHYSICAL EXAMINATION: General appearance: alert, cooperative, and no distress Head: Normocephalic, without obvious abnormality, atraumatic Neck: no adenopathy, no JVD, supple, symmetrical, trachea  midline, and thyroid  not enlarged, symmetric, no tenderness/mass/nodules Lymph nodes: Cervical, supraclavicular, and axillary nodes normal. Resp: clear to auscultation bilaterally Back: symmetric, no curvature. ROM normal. No CVA tenderness. Cardio: regular rate and rhythm, S1, S2 normal, no murmur, click, rub or gallop GI: soft, non-tender; bowel sounds normal; no masses,  no organomegaly Extremities: extremities normal, atraumatic, no cyanosis or edema  ECOG PERFORMANCE STATUS: 1 - Symptomatic but completely ambulatory  Pulse 68, temperature 98.7 F (37.1 C), temperature source Temporal, resp. rate 17, height 5' 11 (1.803 m), weight 203 lb (92.1 kg), SpO2 100%.  LABORATORY DATA: Lab Results  Component Value Date   WBC 5.0 11/22/2024   HGB 13.8 11/22/2024   HCT 39.0 11/22/2024   MCV 92.4 11/22/2024   PLT 169 11/22/2024      Chemistry      Component Value Date/Time   NA 141 10/11/2023 0905   NA 141 08/23/2017 1545   K 4.7 10/11/2023 0905   K 3.8 08/23/2017 1545   CL 107 10/11/2023 0905   CL 108 (H) 01/19/2013 1206   CO2 27 10/11/2023 0905   CO2 26 08/23/2017 1545   BUN 19 10/11/2023 0905   BUN 20.2 08/23/2017 1545   CREATININE 1.49 10/11/2023 0905   CREATININE 1.61 (H) 02/09/2022 0757   CREATININE 1.36 (H) 01/31/2020 1002   CREATININE 1.3 08/23/2017 1545   GLU 97 01/17/2015 0000      Component Value Date/Time   CALCIUM 9.2 10/11/2023 0905   CALCIUM 9.3 08/23/2017 1545   ALKPHOS 74 10/11/2023 0905   ALKPHOS 100 08/23/2017 1545   AST 27 10/11/2023 0905   AST 36 02/09/2022 0757   AST 33 08/23/2017 1545   ALT 24 10/11/2023 0905   ALT 31 02/09/2022 0757   ALT 32 08/23/2017 1545   BILITOT 0.5 10/11/2023 0905   BILITOT 0.9 02/09/2022 0757   BILITOT 0.54 08/23/2017 1545       RADIOGRAPHIC STUDIES: No results found.  ASSESSMENT AND PLAN:  This is a very pleasant 58 years old white male with chronic myeloid leukemia diagnosed in February 2009. The patient has  been on treatment with Gleevec  400 mg p.o. daily since that time and he is feeling fine. He was also recently diagnosed with prostate cancer in February 2025 currently on active surveillance.  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 No reported side effects from imatinib . Recent CBC shows white blood count of 5.0, hemoglobin of 13.9, and platelets of 169, indicating stable disease control. PCR ABL test pending to assess BCR/ABL levels. Consistent adherence to imatinib  regimen since 2009, highlighting the effectiveness of targeted therapy in managing this condition. - Checked with lab to confirm availability of PCR ABL test from current blood draw. - Sent prescription refill for imatinib  to ensure continuous supply.  He was advised to call immediately if he has any other concerning symptoms in the interval. The patient voices understanding of current disease status and treatment options and is in agreement with the current care plan.  All questions were answered. The patient knows to call the clinic with any problems, questions or concerns. We can certainly see the patient much sooner if necessary. The total time spent in the appointment was 20 minutes including review of chart and various tests results, discussions about plan of care and coordination of care plan .  Disclaimer: This note was dictated with voice recognition software. Similar sounding words can inadvertently be transcribed and may not be corrected upon review.

## 2024-11-23 DIAGNOSIS — H0288A Meibomian gland dysfunction right eye, upper and lower eyelids: Secondary | ICD-10-CM | POA: Diagnosis not present

## 2024-11-23 DIAGNOSIS — H0288B Meibomian gland dysfunction left eye, upper and lower eyelids: Secondary | ICD-10-CM | POA: Diagnosis not present

## 2024-11-23 DIAGNOSIS — H04123 Dry eye syndrome of bilateral lacrimal glands: Secondary | ICD-10-CM | POA: Diagnosis not present

## 2024-11-23 DIAGNOSIS — H43313 Vitreous membranes and strands, bilateral: Secondary | ICD-10-CM | POA: Diagnosis not present

## 2024-11-27 ENCOUNTER — Telehealth: Payer: Self-pay | Admitting: Medical Oncology

## 2024-11-27 LAB — BCR-ABL1, CML/ALL, PCR, QUANT
E1A2 Transcript: <0.0032 %
Interpretation (BCRAL):: NEGATIVE
b2a2 transcript: <0.0032 %
b3a2 transcript: <0.0032 %

## 2024-11-27 NOTE — Telephone Encounter (Signed)
 LVM re genetic testing per pt request- I told him the cancer center has a department for genetic testing ETTER Darice Monte) and she said we can do genetic testing for hereditary leukemia's.  If the person we test is affected with leukemia and they want testing, it would need to be performed through a skin biopsy, rather than a blood draw.  We can help set up the skin bx once they have been seen and decide that is what they want to do . I gave pt our main phone number to call and ask for Darice Monte and or return my call .

## 2024-11-30 ENCOUNTER — Encounter: Payer: Self-pay | Admitting: Family Medicine

## 2024-11-30 ENCOUNTER — Ambulatory Visit: Admitting: Family Medicine

## 2024-11-30 VITALS — BP 117/75 | HR 56 | Temp 97.7°F | Ht 72.0 in | Wt 202.2 lb

## 2024-11-30 DIAGNOSIS — Z23 Encounter for immunization: Secondary | ICD-10-CM

## 2024-11-30 DIAGNOSIS — I10 Essential (primary) hypertension: Secondary | ICD-10-CM

## 2024-11-30 DIAGNOSIS — Z0000 Encounter for general adult medical examination without abnormal findings: Secondary | ICD-10-CM

## 2024-11-30 DIAGNOSIS — M654 Radial styloid tenosynovitis [de Quervain]: Secondary | ICD-10-CM | POA: Diagnosis not present

## 2024-11-30 MED ORDER — AMLODIPINE BESY-BENAZEPRIL HCL 5-10 MG PO CAPS: 1.0000 | ORAL_CAPSULE | Freq: Every day | ORAL | 3 refills | Status: AC

## 2024-11-30 NOTE — Progress Notes (Signed)
 Office Note 11/30/2024  CC:  Chief Complaint  Patient presents with   Annual Exam    Pt is fasting; last PSA with [facility] in August (4.6)   Patient is a 58 y.o. male who is here for annual health maintenance exam and 8 month follow-up hypertension. A/P as of last visit: #1 hypertension, well-controlled on amlodipine -benazepril  5-10 mg, 1 tab daily. Annual renal function checks have been stable with GFR in the CRI 3a range for the last 5 years.  Next check will be in 6 months.   2.  ED. refill sildenafil  50 mg tabs.   3.  Prostate cancer, active surveillance per urology  INTERIM HX: Feels well. He has made it through rehab for his left quadriceps tendon surgery. He has been back to playing golf for the last month.  Prostate cancer--> he got another prostate biopsy last month and it showed no changes--> plan is still ongoing PSA surveillance. CML-->All stable on recent heme-onc follow-up 11/22/2024.  For about 9 months he has felt pain in the radial aspect of the left wrist around the base of the thumb.  Hurts some when he is in the loading portion of his golf swing.  When he took 6 months off for golf he did do upper body weight lifting. There has been no trauma to the wrist or thumb.  No swelling.  Past Medical History:  Diagnosis Date   Chronic renal insufficiency, stage 2 (mild)    GFR around 60-65 (sCr 1.2-1.3 avg from 2010-2018). Slight inc in sCr to 1.35-1.4 avg 2019-2021.   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   Essential hypertension    started med 01/2020   Family history of pulmonary fibrosis    Father   Herniated disc, cervical 10/2013   History of pneumonia approx 2014   Hypertriglyceridemia    Increased prostate specific antigen (PSA) velocity 02/2021   Liver cyst    Left hepatic dome 2 cm cyst, CT Sep2013   Metabolic syndrome    elev trigs, low HDL, HTN   Prostate cancer (HCC)    Pruritus ani,  intermittent 01/17/2013   Splenomegaly    resolved CT Sep2013-->this is how his CML was detected/diagnosed   Vasovagal syncope    2024 on airplane    Past Surgical History:  Procedure Laterality Date   BONE MARROW BIOPSY  02/2008   COLONOSCOPY  01/09/2018   mild sigmoid diverticulosis, otherwise normal.  Recall 10 yrs.   EYE SURGERY  04/20/1999   Lasik   PROSTATE BIOPSY     QUADRICEPS TENDON REPAIR Left 05/08/2024   Procedure: REPAIR, TENDON, QUADRICEPS;  Surgeon: Josefina Chew, MD;  Location: [facility];  Service: Orthopedics;  Laterality: Left;   REFRACTIVE SURGERY  2000   TOENAIL EXCISION  01/16/2013   ingrown toenail- not removed    TONSILECTOMY, ADENOIDECTOMY, BILATERAL MYRINGOTOMY AND TUBES     age 89   TREATMENT FISTULA ANAL  2015   Anal fistula surgically repaired.    Family History  Problem Relation Age of Onset   Heart disease Mother        died from a  reaction to heparin    Prostate cancer Father 47   Heart disease Father    Pulmonary fibrosis Father    Varicose Veins Father    Cancer Father    Colon cancer Neg Hx    Colon polyps Neg Hx     Social  History   Socioeconomic History   Marital status: Divorced    Spouse name: Not on file   Number of children: Not on file   Years of education: Not on file   Highest education level: Bachelor's degree (e.g., BA, AB, BS)  Occupational History   Not on file  Tobacco Use   Smoking status: Never   Smokeless tobacco: Never  Vaping Use   Vaping status: Never Used  Substance and Sexual Activity   Alcohol use: Yes    Alcohol/week: 1.0 standard drink of alcohol    Types: 1 Glasses of wine per week    Comment: Once a week   Drug use: Never   Sexual activity: Yes    Birth control/protection: None  Other Topics Concern   Not on file  Social History Narrative   Divorced 2023, 1 son and 1 daughter.   Orig from [place] .   [place] from [facility]   Quarry manager for [employer].    Engineer, water.   No T/A/Ds.   Social Drivers of Health   Tobacco Use: Low Risk (11/30/2024)   Patient History    Smoking Tobacco Use: Never    Smokeless Tobacco Use: Never    Passive Exposure: Not on file  Financial Resource Strain: Low Risk (11/26/2024)   Overall Financial Resource Strain (CARDIA)    Difficulty of Paying Living Expenses: Not hard at all  Food Insecurity: No Food Insecurity (11/26/2024)   Epic    Worried About Programme Researcher, Broadcasting/film/video in the Last Year: Never true    Ran Out of Food in the Last Year: Never true  Transportation Needs: No Transportation Needs (11/26/2024)   Epic    Lack of Transportation (Medical): No    Lack of Transportation (Non-Medical): No  Physical Activity: Sufficiently Active (11/26/2024)   Exercise Vital Sign    Days of Exercise per Week: 5 days    Minutes of Exercise per Session: 30 min  Stress: No Stress Concern Present (11/26/2024)   [employer] of Occupational Health - Occupational Stress Questionnaire    Feeling of Stress: Not at all  Social Connections: Moderately Isolated (11/26/2024)   Social Connection and Isolation Panel    Frequency of Communication with Friends and Family: Twice a week    Frequency of Social Gatherings with Friends and Family: Once a week    Attends Religious Services: Never    Database Administrator or Organizations: Yes    Attends Banker Meetings: 1 to 4 times per year    Marital Status: Divorced  Intimate Partner Violence: Not At Risk (02/14/2024)   Humiliation, Afraid, Rape, and Kick questionnaire    Fear of Current or Ex-Partner: No    Emotionally Abused: No    Physically Abused: No    Sexually Abused: No  Depression (PHQ2-9): Low Risk (11/30/2024)   Depression (PHQ2-9)    PHQ-2 Score: 1  Alcohol Screen: Low Risk (11/26/2024)   Alcohol Screen    Last Alcohol Screening Score (AUDIT): 2  Housing: Low Risk (11/26/2024)   Epic    Unable to Pay for Housing in the Last Year: No    Number of  Times Moved in the Last Year: 1    Homeless in the Last Year: No  Utilities: Not At Risk (02/14/2024)   [facility]HC Utilities    Threatened with loss of utilities: No  Health Literacy: Not on file    Outpatient Medications Prior to Visit  Medication Sig Dispense Refill  CREATINE MONOHYDRATE PO Take 3,000 mg by mouth every morning.     imatinib  (GLEEVEC ) 400 MG tablet Take 1 tablet (400 mg total) by mouth daily. Take with meals and large glass of water.Caution:Chemotherapy. 30 tablet 11   Magnesium 200 MG TABS Take 200 mg by mouth daily.     sildenafil  (VIAGRA ) 50 MG tablet 1-2 tabs po qd prn 180 tablet 3   amLODipine -benazepril  (LOTREL) 5-10 MG capsule Take 1 capsule by mouth daily. 30 capsule 0   No facility-administered medications prior to visit.    Allergies[1]  Review of Systems  Constitutional:  Negative for appetite change, chills, fatigue and fever.  HENT:  Negative for congestion, dental problem, ear pain and sore throat.   Eyes:  Negative for discharge, redness and visual disturbance.  Respiratory:  Negative for cough, chest tightness, shortness of breath and wheezing.   Cardiovascular:  Negative for chest pain, palpitations and leg swelling.  Gastrointestinal:  Negative for abdominal pain, blood in stool, diarrhea, nausea and vomiting.  Genitourinary:  Negative for difficulty urinating, dysuria, flank pain, frequency, hematuria and urgency.  Musculoskeletal:  Positive for arthralgias (left wrist as per hpi). Negative for back pain, joint swelling, myalgias and neck stiffness.  Skin:  Negative for pallor and rash.  Neurological:  Negative for dizziness, speech difficulty, weakness and headaches.  Hematological:  Negative for adenopathy. Does not bruise/bleed easily.  Psychiatric/Behavioral:  Negative for confusion and sleep disturbance. The patient is not nervous/anxious.     PE;    11/30/2024    8:49 AM 11/22/2024    2:59 PM 05/08/2024    4:45 PM  Vitals with BMI  Height  6' 0 5' 11   Weight 202 lbs 3 oz 203 lbs   BMI 27.42 28.33   Systolic 117  126  Diastolic 75  77  Pulse 56 68 69   Gen: Alert, well appearing.  Patient is oriented to person, place, time, and situation. AFFECT: pleasant, lucid thought and speech. ENT: Ears: EACs clear, normal epithelium.  TMs with good light reflex and landmarks bilaterally.  Eyes: no injection, icteris, swelling, or exudate.  EOMI, PERRLA. Nose: no drainage or turbinate edema/swelling.  No injection or focal lesion.  Mouth: lips without lesion/swelling.  Oral mucosa pink and moist.  Dentition intact and without obvious caries or gingival swelling.  Oropharynx without erythema, exudate, or swelling.  Neck: supple/nontender.  No LAD, mass, or TM.  Carotid pulses 2+ bilaterally, without bruits. CV: RRR, no m/r/g.   LUNGS: CTA bilat, nonlabored resps, good aeration in all lung fields. ABD: soft, NT, ND, BS normal.  No hepatospenomegaly or mass.  No bruits. EXT: no clubbing, cyanosis, or edema.  Musculoskeletal: no joint swelling, erythema, warmth, or tenderness.  ROM of all joints intact. Left wrist and thumb without tenderness to palpation.  No swelling.   He gets some focal pain around the first dorsal wrist compartment near the base of the thumb with wrist eversion.  Finkelstein's equivocal.  No crepitus. He has full range of motion of the wrist and thumb. Skin - no sores or suspicious lesions or rashes or color changes  Pertinent labs:  Lab Results  Component Value Date   TSH 1.66 08/11/2022   Lab Results  Component Value Date   WBC 5.0 11/22/2024   HGB 13.8 11/22/2024   HCT 39.0 11/22/2024   MCV 92.4 11/22/2024   PLT 169 11/22/2024   Lab Results  Component Value Date   CREATININE 1.63 (H) 11/22/2024  BUN 18 11/22/2024   NA 138 11/22/2024   K 4.2 11/22/2024   CL 105 11/22/2024   CO2 25 11/22/2024   Lab Results  Component Value Date   ALT 42 11/22/2024   AST 42 (H) 11/22/2024   ALKPHOS 92 11/22/2024    BILITOT 0.4 11/22/2024   Lab Results  Component Value Date   CHOL 180 10/11/2023   Lab Results  Component Value Date   HDL 32.30 (L) 10/11/2023   Lab Results  Component Value Date   LDLCALC 84 10/11/2023   Lab Results  Component Value Date   TRIG 318.0 (H) 10/11/2023   Lab Results  Component Value Date   CHOLHDL 6 10/11/2023   Lab Results  Component Value Date   PSA 4.6 08/07/2024   PSA 4.34 (H) 10/11/2023   PSA 3.06 08/11/2022   Lab Results  Component Value Date   HGBA1C 5.6 10/11/2023   ASSESSMENT AND PLAN:   No problem-specific Assessment & Plan notes found for this encounter.  1 health maintenance exam: Reviewed age and gender appropriate health maintenance issues (prudent diet, regular exercise, health risks of tobacco and excessive alcohol, use of seatbelts, fire alarms in home, use of sunscreen).  Also reviewed age and gender appropriate health screening as well as vaccine recommendations. Vaccines:  Prevnar 20--> given today.  Otherwise ALL UTD. Labs: fasting CBC and cmet good ONE WEEK ago.  Plan lipids and A1c in 1 yr. Prostate ca screening: hx prostate ca-->surveillance--> PSA followed regularly by urology. Colon ca screening: recall 2029.  #2 hypertension, well-controlled on Lotrel 5-10, 1 capsule daily. Electrolytes normal and creatinine stable on labs 1 week ago.  #3 left wrist pain. This is most consistent with de Quervain's tenosynovitis. Discussed thumb spica splint.  He will look for one at a drugstore or on [employer]. Discussed relative rest, particularly decreased gripping motion. If gradually worsening and impairing function then he can return for reexamination and potentially a cortisone injection.  An After Visit Summary was printed and given to the patient.  FOLLOW UP:  Return in about 6 months (around 05/31/2025) for routine chronic illness f/u.  Signed:  Gerlene Hockey, MD           11/30/2024     [1] No Known Allergies

## 2024-11-30 NOTE — Patient Instructions (Signed)
 Thumb spica splint

## 2025-05-31 ENCOUNTER — Ambulatory Visit: Admitting: Family Medicine

## 2025-11-21 ENCOUNTER — Inpatient Hospital Stay: Admitting: Internal Medicine

## 2025-11-21 ENCOUNTER — Inpatient Hospital Stay
